# Patient Record
Sex: Male | Born: 1950 | Race: White | Hispanic: No | Marital: Married | Smoking: Former smoker
Health system: Southern US, Community
[De-identification: ages and names within clinical notes are randomized; demographics above are authoritative.]

## PROBLEM LIST (undated history)

## (undated) DIAGNOSIS — C189 Malignant neoplasm of colon, unspecified: Secondary | ICD-10-CM

## (undated) DIAGNOSIS — C787 Secondary malignant neoplasm of liver and intrahepatic bile duct: Secondary | ICD-10-CM

## (undated) DIAGNOSIS — C20 Malignant neoplasm of rectum: Secondary | ICD-10-CM

## (undated) HISTORY — DX: Malignant neoplasm of rectum: C20

## (undated) HISTORY — DX: Malignant neoplasm of colon, unspecified: C18.9

---

## 1978-09-24 HISTORY — PX: VASECTOMY REVERSAL: SHX243

## 2004-05-19 ENCOUNTER — Ambulatory Visit (HOSPITAL_COMMUNITY): Admission: RE | Admit: 2004-05-19 | Discharge: 2004-05-19 | Payer: Self-pay | Admitting: *Deleted

## 2004-05-19 ENCOUNTER — Encounter (INDEPENDENT_AMBULATORY_CARE_PROVIDER_SITE_OTHER): Payer: Self-pay | Admitting: Specialist

## 2009-08-26 ENCOUNTER — Encounter: Admission: RE | Admit: 2009-08-26 | Discharge: 2009-08-26 | Payer: Self-pay | Admitting: Gastroenterology

## 2009-09-05 ENCOUNTER — Ambulatory Visit: Payer: Self-pay | Admitting: Oncology

## 2009-09-24 HISTORY — PX: COLON SURGERY: SHX602

## 2009-09-26 ENCOUNTER — Inpatient Hospital Stay (HOSPITAL_COMMUNITY): Admission: RE | Admit: 2009-09-26 | Discharge: 2009-09-30 | Payer: Self-pay | Admitting: General Surgery

## 2009-09-26 ENCOUNTER — Encounter (INDEPENDENT_AMBULATORY_CARE_PROVIDER_SITE_OTHER): Payer: Self-pay | Admitting: General Surgery

## 2009-09-29 ENCOUNTER — Ambulatory Visit: Payer: Self-pay | Admitting: Oncology

## 2009-10-11 ENCOUNTER — Ambulatory Visit: Payer: Self-pay | Admitting: Oncology

## 2009-10-13 LAB — CBC WITH DIFFERENTIAL/PLATELET
HGB: 11.5 g/dL — ABNORMAL LOW (ref 13.0–17.1)
LYMPH%: 13.3 % — ABNORMAL LOW (ref 14.0–49.0)
MCH: 30.9 pg (ref 27.2–33.4)
MONO#: 1.4 10*3/uL — ABNORMAL HIGH (ref 0.1–0.9)
NEUT%: 68.9 % (ref 39.0–75.0)
Platelets: 310 10*3/uL (ref 140–400)
RBC: 3.72 10*6/uL — ABNORMAL LOW (ref 4.20–5.82)
RDW: 13.3 % (ref 11.0–14.6)

## 2009-10-13 LAB — COMPREHENSIVE METABOLIC PANEL
ALT: 47 U/L (ref 0–53)
AST: 23 U/L (ref 0–37)
Albumin: 3.7 g/dL (ref 3.5–5.2)
BUN: 13 mg/dL (ref 6–23)
CO2: 24 mEq/L (ref 19–32)
Chloride: 98 mEq/L (ref 96–112)
Potassium: 3.4 mEq/L — ABNORMAL LOW (ref 3.5–5.3)
Sodium: 136 mEq/L (ref 135–145)
Total Bilirubin: 0.8 mg/dL (ref 0.3–1.2)

## 2009-10-13 LAB — CEA: CEA: 1.8 ng/mL (ref 0.0–5.0)

## 2009-10-18 ENCOUNTER — Ambulatory Visit: Admission: RE | Admit: 2009-10-18 | Discharge: 2009-12-16 | Payer: Self-pay | Admitting: Radiation Oncology

## 2009-11-11 ENCOUNTER — Ambulatory Visit: Payer: Self-pay | Admitting: Oncology

## 2009-11-15 LAB — COMPREHENSIVE METABOLIC PANEL
ALT: 11 U/L (ref 0–53)
AST: 9 U/L (ref 0–37)
Albumin: 4.2 g/dL (ref 3.5–5.2)
BUN: 14 mg/dL (ref 6–23)
Potassium: 4.1 mEq/L (ref 3.5–5.3)
Sodium: 139 mEq/L (ref 135–145)
Total Bilirubin: 0.6 mg/dL (ref 0.3–1.2)

## 2009-11-15 LAB — CBC WITH DIFFERENTIAL/PLATELET
BASO%: 0.3 % (ref 0.0–2.0)
EOS%: 2 % (ref 0.0–7.0)
Eosinophils Absolute: 0.1 10*3/uL (ref 0.0–0.5)
HCT: 36.1 % — ABNORMAL LOW (ref 38.4–49.9)
LYMPH%: 19.5 % (ref 14.0–49.0)
MONO%: 10.1 % (ref 0.0–14.0)
NEUT%: 68.1 % (ref 39.0–75.0)
RBC: 4.09 10*6/uL — ABNORMAL LOW (ref 4.20–5.82)
RDW: 13.9 % (ref 11.0–14.6)
lymph#: 1.1 10*3/uL (ref 0.9–3.3)

## 2009-11-30 LAB — CBC WITH DIFFERENTIAL/PLATELET
BASO%: 0.3 % (ref 0.0–2.0)
EOS%: 1.9 % (ref 0.0–7.0)
Eosinophils Absolute: 0.1 10*3/uL (ref 0.0–0.5)
HCT: 34.5 % — ABNORMAL LOW (ref 38.4–49.9)
HGB: 12 g/dL — ABNORMAL LOW (ref 13.0–17.1)
MCHC: 34.7 g/dL (ref 32.0–36.0)
MCV: 87.8 fL (ref 79.3–98.0)
MONO%: 16.8 % — ABNORMAL HIGH (ref 0.0–14.0)
NEUT#: 3.2 10*3/uL (ref 1.5–6.5)
Platelets: 185 10*3/uL (ref 140–400)
lymph#: 0.6 10*3/uL — ABNORMAL LOW (ref 0.9–3.3)

## 2009-11-30 LAB — COMPREHENSIVE METABOLIC PANEL
BUN: 14 mg/dL (ref 6–23)
Glucose, Bld: 86 mg/dL (ref 70–99)
Sodium: 140 mEq/L (ref 135–145)

## 2009-12-12 ENCOUNTER — Ambulatory Visit: Payer: Self-pay | Admitting: Oncology

## 2009-12-14 LAB — CBC WITH DIFFERENTIAL/PLATELET
Basophils Absolute: 0 10*3/uL (ref 0.0–0.1)
EOS%: 1.6 % (ref 0.0–7.0)
MCH: 31.6 pg (ref 27.2–33.4)
MCHC: 34.9 g/dL (ref 32.0–36.0)
MONO#: 0.8 10*3/uL (ref 0.1–0.9)
MONO%: 17.4 % — ABNORMAL HIGH (ref 0.0–14.0)
NEUT#: 3.4 10*3/uL (ref 1.5–6.5)
Platelets: 232 10*3/uL (ref 140–400)
RBC: 4.14 10*6/uL — ABNORMAL LOW (ref 4.20–5.82)
RDW: 20.3 % — ABNORMAL HIGH (ref 11.0–14.6)
WBC: 4.6 10*3/uL (ref 4.0–10.3)
lymph#: 0.3 10*3/uL — ABNORMAL LOW (ref 0.9–3.3)

## 2009-12-14 LAB — COMPREHENSIVE METABOLIC PANEL
ALT: 8 U/L (ref 0–53)
Albumin: 4.1 g/dL (ref 3.5–5.2)
BUN: 10 mg/dL (ref 6–23)
CO2: 28 mEq/L (ref 19–32)
Calcium: 9.2 mg/dL (ref 8.4–10.5)
Creatinine, Ser: 0.88 mg/dL (ref 0.40–1.50)
Total Protein: 6.8 g/dL (ref 6.0–8.3)

## 2010-01-05 LAB — COMPREHENSIVE METABOLIC PANEL
AST: 16 U/L (ref 0–37)
BUN: 10 mg/dL (ref 6–23)
Calcium: 9.1 mg/dL (ref 8.4–10.5)
Chloride: 106 mEq/L (ref 96–112)
Glucose, Bld: 112 mg/dL — ABNORMAL HIGH (ref 70–99)
Total Protein: 6.6 g/dL (ref 6.0–8.3)

## 2010-01-05 LAB — CBC WITH DIFFERENTIAL/PLATELET
EOS%: 3.5 % (ref 0.0–7.0)
HGB: 12.6 g/dL — ABNORMAL LOW (ref 13.0–17.1)
LYMPH%: 16.1 % (ref 14.0–49.0)
MCH: 31.8 pg (ref 27.2–33.4)
MCHC: 35.5 g/dL (ref 32.0–36.0)
MONO#: 0.5 10*3/uL (ref 0.1–0.9)
Platelets: 226 10*3/uL (ref 140–400)
RDW: 19 % — ABNORMAL HIGH (ref 11.0–14.6)
WBC: 3.8 10*3/uL — ABNORMAL LOW (ref 4.0–10.3)

## 2010-01-18 ENCOUNTER — Ambulatory Visit: Payer: Self-pay | Admitting: Oncology

## 2010-01-19 LAB — COMPREHENSIVE METABOLIC PANEL
ALT: 13 U/L (ref 0–53)
Albumin: 4.1 g/dL (ref 3.5–5.2)
BUN: 16 mg/dL (ref 6–23)

## 2010-01-19 LAB — CBC WITH DIFFERENTIAL/PLATELET
Eosinophils Absolute: 0.1 10*3/uL (ref 0.0–0.5)
HCT: 36.1 % — ABNORMAL LOW (ref 38.4–49.9)
HGB: 12.7 g/dL — ABNORMAL LOW (ref 13.0–17.1)
MCH: 31.7 pg (ref 27.2–33.4)
MCV: 90.3 fL (ref 79.3–98.0)
MONO#: 0.6 10*3/uL (ref 0.1–0.9)
NEUT#: 2.8 10*3/uL (ref 1.5–6.5)
Platelets: 174 10*3/uL (ref 140–400)
RBC: 4 10*6/uL — ABNORMAL LOW (ref 4.20–5.82)
RDW: 18.7 % — ABNORMAL HIGH (ref 11.0–14.6)
lymph#: 0.6 10*3/uL — ABNORMAL LOW (ref 0.9–3.3)

## 2010-01-19 LAB — CEA: CEA: 4.8 ng/mL (ref 0.0–5.0)

## 2010-01-30 ENCOUNTER — Ambulatory Visit (HOSPITAL_COMMUNITY): Admission: RE | Admit: 2010-01-30 | Discharge: 2010-01-30 | Payer: Self-pay | Admitting: Oncology

## 2010-02-02 LAB — CBC WITH DIFFERENTIAL/PLATELET
BASO%: 0.3 % (ref 0.0–2.0)
Basophils Absolute: 0 10*3/uL (ref 0.0–0.1)
EOS%: 3 % (ref 0.0–7.0)
Eosinophils Absolute: 0.1 10*3/uL (ref 0.0–0.5)
HCT: 36.5 % — ABNORMAL LOW (ref 38.4–49.9)
HGB: 12.8 g/dL — ABNORMAL LOW (ref 13.0–17.1)
LYMPH%: 15.6 % (ref 14.0–49.0)
MCH: 31.3 pg (ref 27.2–33.4)
MCHC: 35 g/dL (ref 32.0–36.0)
MCV: 89.4 fL (ref 79.3–98.0)
MONO#: 0.7 10*3/uL (ref 0.1–0.9)
MONO%: 18.8 % — ABNORMAL HIGH (ref 0.0–14.0)
NEUT#: 2.3 10*3/uL (ref 1.5–6.5)
NEUT%: 62.3 % (ref 39.0–75.0)
Platelets: 155 10*3/uL (ref 140–400)
RBC: 4.08 10*6/uL — ABNORMAL LOW (ref 4.20–5.82)
RDW: 17.4 % — ABNORMAL HIGH (ref 11.0–14.6)
WBC: 3.7 10*3/uL — ABNORMAL LOW (ref 4.0–10.3)
lymph#: 0.6 10*3/uL — ABNORMAL LOW (ref 0.9–3.3)

## 2010-02-02 LAB — COMPREHENSIVE METABOLIC PANEL
ALT: 16 U/L (ref 0–53)
AST: 15 U/L (ref 0–37)
Albumin: 4.2 g/dL (ref 3.5–5.2)
Alkaline Phosphatase: 52 U/L (ref 39–117)
Calcium: 9.1 mg/dL (ref 8.4–10.5)
Glucose, Bld: 100 mg/dL — ABNORMAL HIGH (ref 70–99)
Potassium: 4.3 mEq/L (ref 3.5–5.3)
Total Bilirubin: 1 mg/dL (ref 0.3–1.2)

## 2010-02-02 LAB — CEA: CEA: 2.8 ng/mL (ref 0.0–5.0)

## 2010-02-14 ENCOUNTER — Ambulatory Visit (HOSPITAL_COMMUNITY): Admission: RE | Admit: 2010-02-14 | Discharge: 2010-02-14 | Payer: Self-pay | Admitting: Oncology

## 2010-02-16 LAB — COMPREHENSIVE METABOLIC PANEL
ALT: 13 U/L (ref 0–53)
AST: 15 U/L (ref 0–37)
Alkaline Phosphatase: 48 U/L (ref 39–117)
BUN: 12 mg/dL (ref 6–23)
Glucose, Bld: 100 mg/dL — ABNORMAL HIGH (ref 70–99)
Total Protein: 6.2 g/dL (ref 6.0–8.3)

## 2010-02-16 LAB — CBC WITH DIFFERENTIAL/PLATELET
BASO%: 0.5 % (ref 0.0–2.0)
LYMPH%: 19.4 % (ref 14.0–49.0)
MCV: 87.1 fL (ref 79.3–98.0)
MONO#: 0.7 10*3/uL (ref 0.1–0.9)
RDW: 15.6 % — ABNORMAL HIGH (ref 11.0–14.6)
lymph#: 0.8 10*3/uL — ABNORMAL LOW (ref 0.9–3.3)
nRBC: 0 % (ref 0–0)

## 2010-02-16 LAB — CEA: CEA: 1.9 ng/mL (ref 0.0–5.0)

## 2010-02-17 ENCOUNTER — Ambulatory Visit: Payer: Self-pay | Admitting: Oncology

## 2010-03-02 LAB — CBC WITH DIFFERENTIAL/PLATELET
EOS%: 5.3 % (ref 0.0–7.0)
Eosinophils Absolute: 0.2 10*3/uL (ref 0.0–0.5)
LYMPH%: 13.1 % — ABNORMAL LOW (ref 14.0–49.0)
MCH: 30.8 pg (ref 27.2–33.4)
MCHC: 35.5 g/dL (ref 32.0–36.0)
MCV: 86.8 fL (ref 79.3–98.0)
MONO%: 24.9 % — ABNORMAL HIGH (ref 0.0–14.0)
NEUT%: 55.7 % (ref 39.0–75.0)

## 2010-03-02 LAB — COMPREHENSIVE METABOLIC PANEL
ALT: 15 U/L (ref 0–53)
Albumin: 4.3 g/dL (ref 3.5–5.2)
CO2: 22 mEq/L (ref 19–32)
Chloride: 107 mEq/L (ref 96–112)
Creatinine, Ser: 0.86 mg/dL (ref 0.40–1.50)
Potassium: 4.2 mEq/L (ref 3.5–5.3)
Total Protein: 6.4 g/dL (ref 6.0–8.3)

## 2010-03-02 LAB — CEA: CEA: 1.9 ng/mL (ref 0.0–5.0)

## 2010-03-16 LAB — COMPREHENSIVE METABOLIC PANEL
BUN: 13 mg/dL (ref 6–23)
CO2: 22 mEq/L (ref 19–32)
Chloride: 106 mEq/L (ref 96–112)
Creatinine, Ser: 1.01 mg/dL (ref 0.40–1.50)
Glucose, Bld: 110 mg/dL — ABNORMAL HIGH (ref 70–99)
Sodium: 140 mEq/L (ref 135–145)
Total Protein: 6.2 g/dL (ref 6.0–8.3)

## 2010-03-16 LAB — CBC WITH DIFFERENTIAL/PLATELET
BASO%: 0.5 % (ref 0.0–2.0)
Eosinophils Absolute: 0.2 10*3/uL (ref 0.0–0.5)
HCT: 37.1 % — ABNORMAL LOW (ref 38.4–49.9)
HGB: 13.1 g/dL (ref 13.0–17.1)
MONO#: 0.8 10*3/uL (ref 0.1–0.9)
MONO%: 19.8 % — ABNORMAL HIGH (ref 0.0–14.0)
WBC: 3.8 10*3/uL — ABNORMAL LOW (ref 4.0–10.3)

## 2010-03-28 ENCOUNTER — Ambulatory Visit: Payer: Self-pay | Admitting: Oncology

## 2010-03-30 LAB — COMPREHENSIVE METABOLIC PANEL
ALT: 20 U/L (ref 0–53)
Albumin: 4.5 g/dL (ref 3.5–5.2)
BUN: 17 mg/dL (ref 6–23)
CO2: 23 mEq/L (ref 19–32)
Chloride: 106 mEq/L (ref 96–112)
Potassium: 4.2 mEq/L (ref 3.5–5.3)
Total Bilirubin: 1 mg/dL (ref 0.3–1.2)

## 2010-03-30 LAB — CBC WITH DIFFERENTIAL/PLATELET
Basophils Absolute: 0 10*3/uL (ref 0.0–0.1)
Eosinophils Absolute: 0.2 10*3/uL (ref 0.0–0.5)
HCT: 40 % (ref 38.4–49.9)
LYMPH%: 14.8 % (ref 14.0–49.0)
MONO%: 22.3 % — ABNORMAL HIGH (ref 0.0–14.0)
NEUT%: 58.7 % (ref 39.0–75.0)
RDW: 15.4 % — ABNORMAL HIGH (ref 11.0–14.6)
WBC: 4.3 10*3/uL (ref 4.0–10.3)

## 2010-04-13 LAB — COMPREHENSIVE METABOLIC PANEL
ALT: 28 U/L (ref 0–53)
Albumin: 4.3 g/dL (ref 3.5–5.2)
Alkaline Phosphatase: 50 U/L (ref 39–117)
CO2: 21 mEq/L (ref 19–32)
Chloride: 107 mEq/L (ref 96–112)
Creatinine, Ser: 0.86 mg/dL (ref 0.40–1.50)
Glucose, Bld: 91 mg/dL (ref 70–99)
Potassium: 4.5 mEq/L (ref 3.5–5.3)
Sodium: 139 mEq/L (ref 135–145)
Total Protein: 6.4 g/dL (ref 6.0–8.3)

## 2010-04-13 LAB — CBC WITH DIFFERENTIAL/PLATELET
BASO%: 0.7 % (ref 0.0–2.0)
Basophils Absolute: 0 10*3/uL (ref 0.0–0.1)
HCT: 37.6 % — ABNORMAL LOW (ref 38.4–49.9)
LYMPH%: 14.4 % (ref 14.0–49.0)
MCHC: 34.6 g/dL (ref 32.0–36.0)
MCV: 89.3 fL (ref 79.3–98.0)
MONO#: 1 10*3/uL — ABNORMAL HIGH (ref 0.1–0.9)
NEUT#: 2.7 10*3/uL (ref 1.5–6.5)
WBC: 4.6 10*3/uL (ref 4.0–10.3)
nRBC: 0 % (ref 0–0)

## 2010-05-18 ENCOUNTER — Ambulatory Visit: Payer: Self-pay | Admitting: Oncology

## 2010-05-22 ENCOUNTER — Ambulatory Visit (HOSPITAL_COMMUNITY): Admission: RE | Admit: 2010-05-22 | Discharge: 2010-05-22 | Payer: Self-pay | Admitting: Oncology

## 2010-05-22 LAB — CBC WITH DIFFERENTIAL/PLATELET
BASO%: 0.3 % (ref 0.0–2.0)
EOS%: 2.7 % (ref 0.0–7.0)
Eosinophils Absolute: 0.1 10*3/uL (ref 0.0–0.5)
LYMPH%: 9.4 % — ABNORMAL LOW (ref 14.0–49.0)
MCHC: 34.3 g/dL (ref 32.0–36.0)
MONO%: 12.5 % (ref 0.0–14.0)
NEUT#: 4 10*3/uL (ref 1.5–6.5)
NEUT%: 75.1 % — ABNORMAL HIGH (ref 39.0–75.0)
RDW: 13.8 % (ref 11.0–14.6)

## 2010-05-22 LAB — COMPREHENSIVE METABOLIC PANEL
AST: 17 U/L (ref 0–37)
Albumin: 4.5 g/dL (ref 3.5–5.2)
Alkaline Phosphatase: 60 U/L (ref 39–117)
CO2: 26 mEq/L (ref 19–32)
Chloride: 104 mEq/L (ref 96–112)
Glucose, Bld: 111 mg/dL — ABNORMAL HIGH (ref 70–99)

## 2010-07-05 ENCOUNTER — Ambulatory Visit: Payer: Self-pay | Admitting: Oncology

## 2010-08-22 ENCOUNTER — Ambulatory Visit: Payer: Self-pay | Admitting: Oncology

## 2010-08-24 LAB — CBC WITH DIFFERENTIAL/PLATELET
EOS%: 6.4 % (ref 0.0–7.0)
MCH: 30.8 pg (ref 27.2–33.4)
MCHC: 35 g/dL (ref 32.0–36.0)
MCV: 88.1 fL (ref 79.3–98.0)
RBC: 4.4 10*6/uL (ref 4.20–5.82)
RDW: 12.5 % (ref 11.0–14.6)
WBC: 3.6 10*3/uL — ABNORMAL LOW (ref 4.0–10.3)
lymph#: 0.5 10*3/uL — ABNORMAL LOW (ref 0.9–3.3)

## 2010-08-24 LAB — COMPREHENSIVE METABOLIC PANEL
Alkaline Phosphatase: 59 U/L (ref 39–117)
BUN: 17 mg/dL (ref 6–23)
CO2: 25 mEq/L (ref 19–32)
Chloride: 105 mEq/L (ref 96–112)
Creatinine, Ser: 0.88 mg/dL (ref 0.40–1.50)
Glucose, Bld: 94 mg/dL (ref 70–99)
Potassium: 4.3 mEq/L (ref 3.5–5.3)
Sodium: 138 mEq/L (ref 135–145)

## 2010-08-24 LAB — CEA: CEA: 5.6 ng/mL — ABNORMAL HIGH (ref 0.0–5.0)

## 2010-10-09 ENCOUNTER — Ambulatory Visit: Payer: Self-pay | Admitting: Oncology

## 2010-10-11 LAB — COMPREHENSIVE METABOLIC PANEL
ALT: 12 U/L (ref 0–53)
AST: 17 U/L (ref 0–37)
Albumin: 4.8 g/dL (ref 3.5–5.2)
Alkaline Phosphatase: 55 U/L (ref 39–117)
BUN: 13 mg/dL (ref 6–23)
CO2: 25 mEq/L (ref 19–32)
Calcium: 9.5 mg/dL (ref 8.4–10.5)
Chloride: 105 mEq/L (ref 96–112)
Creatinine, Ser: 0.93 mg/dL (ref 0.40–1.50)
Glucose, Bld: 91 mg/dL (ref 70–99)
Potassium: 4.3 mEq/L (ref 3.5–5.3)
Sodium: 139 mEq/L (ref 135–145)
Total Bilirubin: 1.1 mg/dL (ref 0.3–1.2)
Total Protein: 6.9 g/dL (ref 6.0–8.3)

## 2010-10-11 LAB — CBC WITH DIFFERENTIAL/PLATELET
BASO%: 0.3 % (ref 0.0–2.0)
Basophils Absolute: 0 10*3/uL (ref 0.0–0.1)
EOS%: 4.6 % (ref 0.0–7.0)
Eosinophils Absolute: 0.2 10*3/uL (ref 0.0–0.5)
HCT: 37.6 % — ABNORMAL LOW (ref 38.4–49.9)
HGB: 13.2 g/dL (ref 13.0–17.1)
LYMPH%: 16.1 % (ref 14.0–49.0)
MCH: 30.5 pg (ref 27.2–33.4)
MCHC: 35.2 g/dL (ref 32.0–36.0)
MCV: 86.5 fL (ref 79.3–98.0)
MONO#: 0.5 10*3/uL (ref 0.1–0.9)
MONO%: 15.5 % — ABNORMAL HIGH (ref 0.0–14.0)
NEUT#: 2.2 10*3/uL (ref 1.5–6.5)
NEUT%: 63.5 % (ref 39.0–75.0)
Platelets: 189 10*3/uL (ref 140–400)
RBC: 4.34 10*6/uL (ref 4.20–5.82)
RDW: 13.3 % (ref 11.0–14.6)
WBC: 3.4 10*3/uL — ABNORMAL LOW (ref 4.0–10.3)
lymph#: 0.5 10*3/uL — ABNORMAL LOW (ref 0.9–3.3)

## 2010-10-11 LAB — CEA: CEA: 17.2 ng/mL — ABNORMAL HIGH (ref 0.0–5.0)

## 2010-10-14 ENCOUNTER — Other Ambulatory Visit: Payer: Self-pay | Admitting: Oncology

## 2010-10-14 DIAGNOSIS — Z800 Family history of malignant neoplasm of digestive organs: Secondary | ICD-10-CM

## 2010-10-15 ENCOUNTER — Encounter: Payer: Self-pay | Admitting: Oncology

## 2010-11-21 ENCOUNTER — Encounter (HOSPITAL_COMMUNITY): Payer: Self-pay

## 2010-11-21 ENCOUNTER — Ambulatory Visit (HOSPITAL_COMMUNITY)
Admission: RE | Admit: 2010-11-21 | Discharge: 2010-11-21 | Disposition: A | Discharge status: Home / Self Care | Payer: BC Managed Care – PPO | Source: Ambulatory Visit | Attending: Oncology | Admitting: Oncology

## 2010-11-21 ENCOUNTER — Encounter (HOSPITAL_BASED_OUTPATIENT_CLINIC_OR_DEPARTMENT_OTHER): Payer: BC Managed Care – PPO | Admitting: Oncology

## 2010-11-21 ENCOUNTER — Other Ambulatory Visit: Payer: Self-pay | Admitting: Oncology

## 2010-11-21 DIAGNOSIS — E278 Other specified disorders of adrenal gland: Secondary | ICD-10-CM | POA: Insufficient documentation

## 2010-11-21 DIAGNOSIS — C19 Malignant neoplasm of rectosigmoid junction: Secondary | ICD-10-CM

## 2010-11-21 DIAGNOSIS — J984 Other disorders of lung: Secondary | ICD-10-CM | POA: Insufficient documentation

## 2010-11-21 DIAGNOSIS — Z800 Family history of malignant neoplasm of digestive organs: Secondary | ICD-10-CM

## 2010-11-21 DIAGNOSIS — Z452 Encounter for adjustment and management of vascular access device: Secondary | ICD-10-CM

## 2010-11-21 DIAGNOSIS — C189 Malignant neoplasm of colon, unspecified: Secondary | ICD-10-CM | POA: Insufficient documentation

## 2010-11-21 LAB — CMP (CANCER CENTER ONLY)
ALT(SGPT): 19 U/L (ref 10–47)
AST: 20 U/L (ref 11–38)
Albumin: 4 g/dL (ref 3.3–5.5)
BUN, Bld: 14 mg/dL (ref 7–22)
Calcium: 9.3 mg/dL (ref 8.0–10.3)
Chloride: 98 mEq/L (ref 98–108)
Potassium: 4.2 mEq/L (ref 3.3–4.7)

## 2010-11-21 LAB — CBC WITH DIFFERENTIAL/PLATELET
BASO%: 0.5 % (ref 0.0–2.0)
Basophils Absolute: 0 10*3/uL (ref 0.0–0.1)
EOS%: 6.4 % (ref 0.0–7.0)
HGB: 13.7 g/dL (ref 13.0–17.1)
MCH: 30.1 pg (ref 27.2–33.4)
RDW: 13.2 % (ref 11.0–14.6)
WBC: 3.8 10*3/uL — ABNORMAL LOW (ref 4.0–10.3)
lymph#: 0.7 10*3/uL — ABNORMAL LOW (ref 0.9–3.3)

## 2010-11-21 MED ORDER — IOHEXOL 300 MG/ML  SOLN
125.0000 mL | Freq: Once | INTRAMUSCULAR | Status: AC | PRN
  Administered 2010-11-21: 125 mL via INTRAVENOUS

## 2010-11-23 ENCOUNTER — Encounter (HOSPITAL_BASED_OUTPATIENT_CLINIC_OR_DEPARTMENT_OTHER): Payer: BC Managed Care – PPO | Admitting: Oncology

## 2010-11-23 ENCOUNTER — Other Ambulatory Visit: Payer: Self-pay | Admitting: Oncology

## 2010-11-23 DIAGNOSIS — N2889 Other specified disorders of kidney and ureter: Secondary | ICD-10-CM

## 2010-11-23 DIAGNOSIS — C189 Malignant neoplasm of colon, unspecified: Secondary | ICD-10-CM

## 2010-11-23 DIAGNOSIS — C19 Malignant neoplasm of rectosigmoid junction: Secondary | ICD-10-CM

## 2010-11-30 ENCOUNTER — Other Ambulatory Visit: Payer: Self-pay | Admitting: Oncology

## 2010-11-30 ENCOUNTER — Encounter (HOSPITAL_COMMUNITY)
Admission: RE | Admit: 2010-11-30 | Discharge: 2010-11-30 | Disposition: A | Discharge status: Home / Self Care | Payer: BC Managed Care – PPO | Source: Ambulatory Visit | Attending: Oncology | Admitting: Oncology

## 2010-11-30 DIAGNOSIS — N2889 Other specified disorders of kidney and ureter: Secondary | ICD-10-CM

## 2010-11-30 DIAGNOSIS — C189 Malignant neoplasm of colon, unspecified: Secondary | ICD-10-CM | POA: Insufficient documentation

## 2010-11-30 DIAGNOSIS — E279 Disorder of adrenal gland, unspecified: Secondary | ICD-10-CM | POA: Insufficient documentation

## 2010-11-30 DIAGNOSIS — R911 Solitary pulmonary nodule: Secondary | ICD-10-CM | POA: Insufficient documentation

## 2010-11-30 LAB — GLUCOSE, CAPILLARY: Glucose-Capillary: 104 mg/dL — ABNORMAL HIGH (ref 70–99)

## 2010-11-30 MED ORDER — FLUDEOXYGLUCOSE F - 18 (FDG) INJECTION
18.2000 | Freq: Once | INTRAVENOUS | Status: AC | PRN
  Administered 2010-11-30: 18.2 via INTRAVENOUS

## 2010-12-10 LAB — CBC
Hemoglobin: 12.1 g/dL — ABNORMAL LOW (ref 13.0–17.0)
Hemoglobin: 12.3 g/dL — ABNORMAL LOW (ref 13.0–17.0)
MCHC: 34.9 g/dL (ref 30.0–36.0)
MCV: 90.2 fL (ref 78.0–100.0)
RBC: 3.84 MIL/uL — ABNORMAL LOW (ref 4.22–5.81)
RBC: 3.91 MIL/uL — ABNORMAL LOW (ref 4.22–5.81)
RDW: 13 % (ref 11.5–15.5)

## 2010-12-10 LAB — DIFFERENTIAL
Basophils Absolute: 0 10*3/uL (ref 0.0–0.1)
Basophils Relative: 0 % (ref 0–1)
Eosinophils Absolute: 0.1 10*3/uL (ref 0.0–0.7)
Eosinophils Absolute: 0.1 10*3/uL (ref 0.0–0.7)
Eosinophils Relative: 1 % (ref 0–5)
Eosinophils Relative: 2 % (ref 0–5)
Lymphocytes Relative: 16 % (ref 12–46)
Lymphs Abs: 1.3 10*3/uL (ref 0.7–4.0)
Monocytes Absolute: 0.9 10*3/uL (ref 0.1–1.0)
Monocytes Relative: 11 % (ref 3–12)
Monocytes Relative: 15 % — ABNORMAL HIGH (ref 3–12)
Neutro Abs: 4.3 10*3/uL (ref 1.7–7.7)
Neutrophils Relative %: 72 % (ref 43–77)

## 2010-12-10 LAB — BASIC METABOLIC PANEL
CO2: 29 mEq/L (ref 19–32)
Calcium: 8.1 mg/dL — ABNORMAL LOW (ref 8.4–10.5)
Calcium: 8.3 mg/dL — ABNORMAL LOW (ref 8.4–10.5)
Chloride: 102 mEq/L (ref 96–112)
Creatinine, Ser: 0.7 mg/dL (ref 0.4–1.5)
GFR calc Af Amer: >60 mL/min (ref >60)
GFR calc Af Amer: >60 mL/min (ref >60)
GFR calc non Af Amer: >60 mL/min (ref >60)
Glucose, Bld: 130 mg/dL — ABNORMAL HIGH (ref 70–99)
Sodium: 135 mEq/L (ref 135–145)
Sodium: 137 mEq/L (ref 135–145)

## 2010-12-25 LAB — CEA: CEA: 34.8 ng/mL — ABNORMAL HIGH (ref 0.0–5.0)

## 2010-12-25 LAB — DIFFERENTIAL
Eosinophils Absolute: 0.4 10*3/uL (ref 0.0–0.7)
Eosinophils Relative: 5 % (ref 0–5)
Lymphocytes Relative: 31 % (ref 12–46)
Lymphs Abs: 2.3 10*3/uL (ref 0.7–4.0)
Monocytes Relative: 12 % (ref 3–12)

## 2010-12-25 LAB — COMPREHENSIVE METABOLIC PANEL
ALT: 15 U/L (ref 0–53)
AST: 17 U/L (ref 0–37)
Albumin: 4.2 g/dL (ref 3.5–5.2)
CO2: 28 mEq/L (ref 19–32)
Calcium: 9.3 mg/dL (ref 8.4–10.5)
Creatinine, Ser: 0.76 mg/dL (ref 0.4–1.5)
GFR calc Af Amer: >60 mL/min (ref >60)
GFR calc non Af Amer: >60 mL/min (ref >60)
Sodium: 139 mEq/L (ref 135–145)
Total Protein: 7.2 g/dL (ref 6.0–8.3)

## 2010-12-25 LAB — CBC
MCHC: 34.8 g/dL (ref 30.0–36.0)
Platelets: 217 10*3/uL (ref 150–400)
RDW: 12.9 % (ref 11.5–15.5)

## 2011-01-02 ENCOUNTER — Other Ambulatory Visit: Payer: Self-pay | Admitting: General Surgery

## 2011-01-02 ENCOUNTER — Encounter (HOSPITAL_COMMUNITY): Payer: BC Managed Care – PPO | Attending: General Surgery

## 2011-01-02 DIAGNOSIS — Z01810 Encounter for preprocedural cardiovascular examination: Secondary | ICD-10-CM | POA: Insufficient documentation

## 2011-01-02 DIAGNOSIS — Z01812 Encounter for preprocedural laboratory examination: Secondary | ICD-10-CM | POA: Insufficient documentation

## 2011-01-02 DIAGNOSIS — C20 Malignant neoplasm of rectum: Secondary | ICD-10-CM | POA: Insufficient documentation

## 2011-01-02 DIAGNOSIS — E279 Disorder of adrenal gland, unspecified: Secondary | ICD-10-CM | POA: Insufficient documentation

## 2011-01-02 LAB — COMPREHENSIVE METABOLIC PANEL
Alkaline Phosphatase: 58 U/L (ref 39–117)
BUN: 9 mg/dL (ref 6–23)
CO2: 28 mEq/L (ref 19–32)
Chloride: 104 mEq/L (ref 96–112)
GFR calc non Af Amer: >60 mL/min (ref >60)
Glucose, Bld: 89 mg/dL (ref 70–99)
Potassium: 3.9 mEq/L (ref 3.5–5.1)
Total Bilirubin: 0.8 mg/dL (ref 0.3–1.2)

## 2011-01-02 LAB — DIFFERENTIAL
Eosinophils Absolute: 0.2 10*3/uL (ref 0.0–0.7)
Lymphs Abs: 0.7 10*3/uL (ref 0.7–4.0)
Monocytes Absolute: 0.7 10*3/uL (ref 0.1–1.0)
Monocytes Relative: 13 % — ABNORMAL HIGH (ref 3–12)
Neutro Abs: 3.6 10*3/uL (ref 1.7–7.7)
Neutrophils Relative %: 68 % (ref 43–77)

## 2011-01-02 LAB — URINALYSIS, ROUTINE W REFLEX MICROSCOPIC
Bilirubin Urine: NEGATIVE
Glucose, UA: NEGATIVE mg/dL
Ketones, ur: NEGATIVE mg/dL
Protein, ur: NEGATIVE mg/dL
pH: 7.5 (ref 5.0–8.0)

## 2011-01-02 LAB — CBC
Hemoglobin: 13.8 g/dL (ref 13.0–17.0)
MCH: 29.4 pg (ref 26.0–34.0)
MCHC: 33.3 g/dL (ref 30.0–36.0)
MCV: 88.1 fL (ref 78.0–100.0)
RBC: 4.7 MIL/uL (ref 4.22–5.81)

## 2011-01-02 LAB — SURGICAL PCR SCREEN
MRSA, PCR: NEGATIVE
Staphylococcus aureus: POSITIVE — AB

## 2011-01-10 ENCOUNTER — Other Ambulatory Visit: Payer: Self-pay | Admitting: General Surgery

## 2011-01-10 ENCOUNTER — Ambulatory Visit (HOSPITAL_COMMUNITY)
Admission: RE | Admit: 2011-01-10 | Discharge: 2011-01-11 | Disposition: A | Discharge status: Home / Self Care | Payer: BC Managed Care – PPO | Source: Ambulatory Visit | Attending: General Surgery | Admitting: General Surgery

## 2011-01-10 DIAGNOSIS — Z01812 Encounter for preprocedural laboratory examination: Secondary | ICD-10-CM | POA: Insufficient documentation

## 2011-01-10 DIAGNOSIS — C20 Malignant neoplasm of rectum: Secondary | ICD-10-CM | POA: Insufficient documentation

## 2011-01-10 DIAGNOSIS — E279 Disorder of adrenal gland, unspecified: Secondary | ICD-10-CM | POA: Insufficient documentation

## 2011-01-10 DIAGNOSIS — C7970 Secondary malignant neoplasm of unspecified adrenal gland: Principal | ICD-10-CM | POA: Insufficient documentation

## 2011-01-10 HISTORY — PX: ADRENALECTOMY: SHX876

## 2011-01-10 LAB — ABO/RH: ABO/RH(D): A POS

## 2011-01-11 LAB — CBC
HCT: 35.7 % — ABNORMAL LOW (ref 39.0–52.0)
Hemoglobin: 11.9 g/dL — ABNORMAL LOW (ref 13.0–17.0)
MCH: 28.7 pg (ref 26.0–34.0)
MCHC: 33.3 g/dL (ref 30.0–36.0)
MCV: 86.2 fL (ref 78.0–100.0)

## 2011-01-15 NOTE — Op Note (Signed)
Christopher Jimenez, Christopher Jimenez                ACCOUNT NO.:  [id number]  MEDICAL RECORD NO.:  [id number]           PATIENT TYPE:  O  LOCATION:  1534                         FACILITY:  [facility]  PHYSICIAN:  Sharlet Salina T. 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  SURGICAL PROCEDURE:  Laparoscopic right adrenalectomy.  SURGEON:  Lorne Skeens. Joneric Streight, M.D.  ASSISTANT:  Almond Lint, MD  ANESTHESIA:  General.  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 Carolynne Edouard in December 2010.  He had postoperative chemoradiation.  He recently was found to have a rising CEA up to 45.2, and CT scan of the chest, abdomen and pelvis has revealed a 3.2-cm mass in the medial aspect of the right adrenal gland.  This area lights up on PET scan as well and there is no other evidence of disease.  With this finding, we have recommended proceeding with right adrenalectomy laparoscopically.  The nature of the procedure, its indications, risks of anesthetic complications, bleeding, infection, possible need for open procedure were discussed and understood.  He is now brought to the operating room for this procedure.  DESCRIPTION OF OPERATION:  The patient brought to the operating room, placed supine position on operating table and general endotracheal anesthesia was induced.  He was then carefully positioned in the left lateral decubitus position and the table broken to open up the right side of the abdomen, and he was positioned on a beanbag, carefully padded and taped.  Foley catheter and orogastric tube had been placed. He received  preoperative antibiotics.  The abdomen and right flank were widely and sterilely prepped and draped.  Correct patient and procedure were verified.  As the patient has had previous laparotomy, I used open Hasson technique through a 1.5-cm incision just beneath the right costal margin, little anterior to the anterior axillary line, and the peritoneum was entered under direct vision.  With 3-0 mattress sutures of 0 Vicryl, the Hasson trocar was placed and pneumoperitoneum established.  Under direct vision, a 5-mm trocar was placed more medially subcostally, a 12-mm trocar somewhat more laterally and then finally a 5-mm trocar in the right flank, all under direct vision. Survey of the abdomen showed no evidence of peritoneal implants or other metastatic disease.  The liver appeared normal.  There were some adhesions over the dome of the right lobe of the liver and from the inferior edge of the right lobe of liver to the omentum, which were carefully taken down with cautery.  The triangular ligament and vena cava were exposed.  Using careful hook cautery dissection, the triangular ligament was incised and the right lobe of the liver mobilized medially and inferiorly, dividing the triangular ligament up to the diaphragm.  The duodenum and vena cava were clearly identified. The adrenal gland could  be seen in the retroperitoneum at this point and there did seem to be a small, firm, mobile mass just lateral to the vena cava.  A flexible retractor was used to retract the right lobe of the liver medially and using the hook cautery, the peritoneum along the lateral edge of the vena cava was incised and careful blunt dissection was used to mobilize the adrenal gland away from the vena cava along its medial length.  The mass was seen medially and superiorly in the adrenal gland but was quite mobile and not at all fixed or attached to the vena cava or surrounding tissue.  As the adrenal was mobilized  away from the cava, the adrenal vein was identified entering the gland just inferior to the mass.  Using careful blunt dissection, the vein was completely dissected free and mobilized from its origin at the adrenal gland over to the vena cava.  After complete mobilization of the vein, there was plenty of length for clipping, and the vein was controlled with 1 clip on the adrenal side and 2 clips on the cava side and then divided.  This allowed the gland to be mobilized away from the cava laterally and areola attachments were divided with cautery and harmonic scalpel.  I then came around the superior and inferior borders of the adrenal gland which were well defined with the harmonic scalpel, and the gland was dissected out of the retroperitoneum.  Small arterial branches were controlled with cautery.  I was then able to sweep the gland inferiorly and divide lateral attachments with the cautery and finally we came across the inferior edge through perinephric fat just below the inferior edge of the adrenal gland clearly visualizing the kidney and the gland was removed.  The bed of the adrenal was irrigated.  Hemostasis was assured and a Surgicel pack placed here and the liver placed back over the defect.  The gland was placed in an EndoCatch bag and brought out through the Kirvin trocar site.  All CO2 was evacuated and trocars removed.  The fascia at the site was closed with a further figure-of- eight suture of 0 Vicryl.  Skin incisions were closed with subcuticular Monocryl and Dermabond.  Sponge and needle counts correct.  The patient was taken to recovery in good condition.     Lorne Skeens. Casimiro Lienhard, M.D.     Tory Emerald  D:  01/10/2011  T:  01/10/2011  Job:  [id number]  cc:   Blenda Nicely. Clelia Croft, M.D. Fax: [phone]  Electronically Signed by Glenna Fellows M.D. on 01/15/2011 10:03:12 AM

## 2011-02-07 ENCOUNTER — Other Ambulatory Visit: Payer: Self-pay | Admitting: Oncology

## 2011-02-07 ENCOUNTER — Encounter (HOSPITAL_BASED_OUTPATIENT_CLINIC_OR_DEPARTMENT_OTHER): Payer: BC Managed Care – PPO | Admitting: Oncology

## 2011-02-07 DIAGNOSIS — C19 Malignant neoplasm of rectosigmoid junction: Secondary | ICD-10-CM

## 2011-02-07 DIAGNOSIS — C189 Malignant neoplasm of colon, unspecified: Secondary | ICD-10-CM

## 2011-02-07 LAB — CBC WITH DIFFERENTIAL/PLATELET
Eosinophils Absolute: 0.3 10*3/uL (ref 0.0–0.5)
LYMPH%: 20 % (ref 14.0–49.0)
MCV: 87.8 fL (ref 79.3–98.0)
MONO%: 12.6 % (ref 0.0–14.0)
NEUT#: 2.3 10*3/uL (ref 1.5–6.5)
Platelets: 187 10*3/uL (ref 140–400)
RBC: 4.19 10*6/uL — ABNORMAL LOW (ref 4.20–5.82)

## 2011-02-07 LAB — COMPREHENSIVE METABOLIC PANEL
Alkaline Phosphatase: 59 U/L (ref 39–117)
BUN: 17 mg/dL (ref 6–23)
Glucose, Bld: 103 mg/dL — ABNORMAL HIGH (ref 70–99)
Sodium: 137 mEq/L (ref 135–145)
Total Bilirubin: 0.4 mg/dL (ref 0.3–1.2)

## 2011-03-21 ENCOUNTER — Encounter (HOSPITAL_BASED_OUTPATIENT_CLINIC_OR_DEPARTMENT_OTHER): Payer: BC Managed Care – PPO | Admitting: Oncology

## 2011-03-21 DIAGNOSIS — C19 Malignant neoplasm of rectosigmoid junction: Secondary | ICD-10-CM

## 2011-05-07 ENCOUNTER — Other Ambulatory Visit: Payer: Self-pay | Admitting: Oncology

## 2011-05-07 ENCOUNTER — Encounter (HOSPITAL_BASED_OUTPATIENT_CLINIC_OR_DEPARTMENT_OTHER): Payer: BC Managed Care – PPO | Admitting: Oncology

## 2011-05-07 ENCOUNTER — Ambulatory Visit (HOSPITAL_COMMUNITY)
Admission: RE | Admit: 2011-05-07 | Discharge: 2011-05-07 | Disposition: A | Discharge status: Home / Self Care | Payer: BC Managed Care – PPO | Source: Ambulatory Visit | Attending: Oncology | Admitting: Oncology

## 2011-05-07 DIAGNOSIS — J438 Other emphysema: Secondary | ICD-10-CM | POA: Insufficient documentation

## 2011-05-07 DIAGNOSIS — C189 Malignant neoplasm of colon, unspecified: Secondary | ICD-10-CM | POA: Insufficient documentation

## 2011-05-07 DIAGNOSIS — Z452 Encounter for adjustment and management of vascular access device: Secondary | ICD-10-CM

## 2011-05-07 DIAGNOSIS — Z933 Colostomy status: Secondary | ICD-10-CM | POA: Insufficient documentation

## 2011-05-07 DIAGNOSIS — C787 Secondary malignant neoplasm of liver and intrahepatic bile duct: Secondary | ICD-10-CM

## 2011-05-07 DIAGNOSIS — R911 Solitary pulmonary nodule: Secondary | ICD-10-CM | POA: Insufficient documentation

## 2011-05-07 DIAGNOSIS — C20 Malignant neoplasm of rectum: Secondary | ICD-10-CM

## 2011-05-07 DIAGNOSIS — C19 Malignant neoplasm of rectosigmoid junction: Secondary | ICD-10-CM

## 2011-05-07 DIAGNOSIS — K7689 Other specified diseases of liver: Secondary | ICD-10-CM | POA: Insufficient documentation

## 2011-05-07 HISTORY — DX: Secondary malignant neoplasm of liver and intrahepatic bile duct: C78.7

## 2011-05-07 LAB — CMP (CANCER CENTER ONLY)
CO2: 28 mEq/L (ref 18–33)
Calcium: 8.7 mg/dL (ref 8.0–10.3)
Creat: 1 mg/dl (ref 0.6–1.2)
Glucose, Bld: 101 mg/dL (ref 73–118)
Total Bilirubin: 1.1 mg/dl (ref 0.20–1.60)
Total Protein: 6.9 g/dL (ref 6.4–8.1)

## 2011-05-07 LAB — CBC WITH DIFFERENTIAL/PLATELET
Eosinophils Absolute: 0.2 10*3/uL (ref 0.0–0.5)
HCT: 39.7 % (ref 38.4–49.9)
HGB: 13.9 g/dL (ref 13.0–17.1)
LYMPH%: 19.7 % (ref 14.0–49.0)
MONO#: 0.5 10*3/uL (ref 0.1–0.9)
NEUT#: 2.3 10*3/uL (ref 1.5–6.5)
NEUT%: 59.7 % (ref 39.0–75.0)
Platelets: 175 10*3/uL (ref 140–400)
WBC: 3.8 10*3/uL — ABNORMAL LOW (ref 4.0–10.3)

## 2011-05-07 LAB — CEA: CEA: 25.5 ng/mL — ABNORMAL HIGH (ref 0.0–5.0)

## 2011-05-07 MED ORDER — IOHEXOL 300 MG/ML  SOLN
100.0000 mL | Freq: Once | INTRAMUSCULAR | Status: AC | PRN
  Administered 2011-05-07: 100 mL via INTRAVENOUS

## 2011-05-11 ENCOUNTER — Encounter (HOSPITAL_BASED_OUTPATIENT_CLINIC_OR_DEPARTMENT_OTHER): Payer: BC Managed Care – PPO | Admitting: Oncology

## 2011-05-11 DIAGNOSIS — C19 Malignant neoplasm of rectosigmoid junction: Secondary | ICD-10-CM

## 2011-05-17 ENCOUNTER — Other Ambulatory Visit: Payer: Self-pay | Admitting: Oncology

## 2011-05-17 DIAGNOSIS — R160 Hepatomegaly, not elsewhere classified: Secondary | ICD-10-CM

## 2011-05-22 ENCOUNTER — Ambulatory Visit
Admission: RE | Admit: 2011-05-22 | Discharge: 2011-05-22 | Disposition: A | Discharge status: Home / Self Care | Payer: BC Managed Care – PPO | Source: Ambulatory Visit | Attending: Oncology | Admitting: Oncology

## 2011-05-22 VITALS — BP 129/91 | HR 68 | Temp 97.7°F | Resp 16 | Ht 72.0 in | Wt 190.0 lb

## 2011-05-22 DIAGNOSIS — R160 Hepatomegaly, not elsewhere classified: Secondary | ICD-10-CM

## 2011-05-22 HISTORY — DX: Secondary malignant neoplasm of liver and intrahepatic bile duct: C78.7

## 2011-05-23 ENCOUNTER — Other Ambulatory Visit: Payer: Self-pay | Admitting: Oncology

## 2011-05-23 DIAGNOSIS — C189 Malignant neoplasm of colon, unspecified: Secondary | ICD-10-CM

## 2011-05-23 NOTE — Progress Notes (Addendum)
Consult w/ Dr Miles Costain to discuss microwave ablation of a new solitary liver lesion.  Appetite good.  Weight stable.  Denies abd pain, distention or discomfort.  Normal bowel habits.  Denies bloody stools.   Afebrile.  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  Clinically is doing well.  Dr Alver Fisher office to schedule PET CT prior to IR microwave ablation of liver met

## 2011-05-29 ENCOUNTER — Other Ambulatory Visit: Payer: Self-pay | Admitting: Oncology

## 2011-05-29 ENCOUNTER — Encounter (HOSPITAL_COMMUNITY)
Admission: RE | Admit: 2011-05-29 | Discharge: 2011-05-29 | Disposition: A | Discharge status: Home / Self Care | Payer: BC Managed Care – PPO | Source: Ambulatory Visit | Attending: Oncology | Admitting: Oncology

## 2011-05-29 DIAGNOSIS — N400 Enlarged prostate without lower urinary tract symptoms: Secondary | ICD-10-CM | POA: Insufficient documentation

## 2011-05-29 DIAGNOSIS — C7970 Secondary malignant neoplasm of unspecified adrenal gland: Secondary | ICD-10-CM | POA: Insufficient documentation

## 2011-05-29 DIAGNOSIS — K7689 Other specified diseases of liver: Secondary | ICD-10-CM | POA: Insufficient documentation

## 2011-05-29 DIAGNOSIS — C189 Malignant neoplasm of colon, unspecified: Secondary | ICD-10-CM

## 2011-05-29 DIAGNOSIS — Q619 Cystic kidney disease, unspecified: Secondary | ICD-10-CM | POA: Insufficient documentation

## 2011-05-29 MED ORDER — FLUDEOXYGLUCOSE F - 18 (FDG) INJECTION
17.8000 | Freq: Once | INTRAVENOUS | Status: AC | PRN
  Administered 2011-05-29: 17.8 via INTRAVENOUS

## 2011-06-08 ENCOUNTER — Other Ambulatory Visit (HOSPITAL_COMMUNITY): Payer: Self-pay | Admitting: Interventional Radiology

## 2011-06-08 DIAGNOSIS — C787 Secondary malignant neoplasm of liver and intrahepatic bile duct: Secondary | ICD-10-CM

## 2011-07-03 ENCOUNTER — Encounter (HOSPITAL_COMMUNITY): Payer: BC Managed Care – PPO

## 2011-07-03 LAB — CBC
HCT: 38.9 % — ABNORMAL LOW (ref 39.0–52.0)
Hemoglobin: 13.3 g/dL (ref 13.0–17.0)
MCH: 29.6 pg (ref 26.0–34.0)
MCV: 86.6 fL (ref 78.0–100.0)
Platelets: 196 10*3/uL (ref 150–400)
RBC: 4.49 MIL/uL (ref 4.22–5.81)
WBC: 5.7 10*3/uL (ref 4.0–10.5)

## 2011-07-03 LAB — PROTIME-INR
INR: 0.95 (ref 0.00–1.49)
Prothrombin Time: 12.9 seconds (ref 11.6–15.2)

## 2011-07-03 LAB — COMPREHENSIVE METABOLIC PANEL
Albumin: 3.9 g/dL (ref 3.5–5.2)
BUN: 15 mg/dL (ref 6–23)
Creatinine, Ser: 0.73 mg/dL (ref 0.50–1.35)
GFR calc Af Amer: >90 mL/min (ref >90)
Total Protein: 6.9 g/dL (ref 6.0–8.3)

## 2011-07-06 ENCOUNTER — Observation Stay (HOSPITAL_COMMUNITY)
Admission: RE | Admit: 2011-07-06 | Discharge: 2011-07-07 | Disposition: A | Discharge status: Home / Self Care | Payer: BC Managed Care – PPO | Source: Ambulatory Visit | Attending: Interventional Radiology | Admitting: Interventional Radiology

## 2011-07-06 ENCOUNTER — Ambulatory Visit (HOSPITAL_COMMUNITY)
Admission: RE | Admit: 2011-07-06 | Discharge: 2011-07-06 | Disposition: A | Discharge status: Home / Self Care | Payer: BC Managed Care – PPO | Source: Ambulatory Visit | Attending: Interventional Radiology | Admitting: Interventional Radiology

## 2011-07-06 DIAGNOSIS — C787 Secondary malignant neoplasm of liver and intrahepatic bile duct: Secondary | ICD-10-CM | POA: Insufficient documentation

## 2011-07-06 DIAGNOSIS — Z01812 Encounter for preprocedural laboratory examination: Secondary | ICD-10-CM | POA: Insufficient documentation

## 2011-07-06 DIAGNOSIS — Z9049 Acquired absence of other specified parts of digestive tract: Secondary | ICD-10-CM | POA: Insufficient documentation

## 2011-07-06 DIAGNOSIS — Z933 Colostomy status: Secondary | ICD-10-CM | POA: Insufficient documentation

## 2011-07-06 DIAGNOSIS — Z9089 Acquired absence of other organs: Secondary | ICD-10-CM | POA: Insufficient documentation

## 2011-07-06 DIAGNOSIS — K7689 Other specified diseases of liver: Secondary | ICD-10-CM | POA: Insufficient documentation

## 2011-07-06 DIAGNOSIS — C189 Malignant neoplasm of colon, unspecified: Principal | ICD-10-CM | POA: Insufficient documentation

## 2011-07-07 LAB — COMPREHENSIVE METABOLIC PANEL
AST: 70 U/L — ABNORMAL HIGH (ref 0–37)
Albumin: 3.5 g/dL (ref 3.5–5.2)
BUN: 10 mg/dL (ref 6–23)
Calcium: 9 mg/dL (ref 8.4–10.5)
Chloride: 98 mEq/L (ref 96–112)
Creatinine, Ser: 0.63 mg/dL (ref 0.50–1.35)
Total Bilirubin: 1.4 mg/dL — ABNORMAL HIGH (ref 0.3–1.2)
Total Protein: 6.2 g/dL (ref 6.0–8.3)

## 2011-07-07 LAB — CROSSMATCH
ABO/RH(D): A POS
Antibody Screen: NEGATIVE
Unit division: 0

## 2011-07-07 LAB — CBC
MCHC: 33.4 g/dL (ref 30.0–36.0)
MCV: 87.7 fL (ref 78.0–100.0)
Platelets: 173 10*3/uL (ref 150–400)
RDW: 12.7 % (ref 11.5–15.5)
WBC: 7.5 10*3/uL (ref 4.0–10.5)

## 2011-07-19 ENCOUNTER — Other Ambulatory Visit: Payer: Self-pay | Admitting: Oncology

## 2011-07-19 ENCOUNTER — Telehealth: Payer: Self-pay | Admitting: Oncology

## 2011-07-19 ENCOUNTER — Encounter (HOSPITAL_BASED_OUTPATIENT_CLINIC_OR_DEPARTMENT_OTHER): Payer: BC Managed Care – PPO | Admitting: Oncology

## 2011-07-19 DIAGNOSIS — C189 Malignant neoplasm of colon, unspecified: Secondary | ICD-10-CM

## 2011-07-19 DIAGNOSIS — C19 Malignant neoplasm of rectosigmoid junction: Secondary | ICD-10-CM

## 2011-07-19 DIAGNOSIS — Z452 Encounter for adjustment and management of vascular access device: Secondary | ICD-10-CM

## 2011-07-19 LAB — COMPREHENSIVE METABOLIC PANEL
Alkaline Phosphatase: 54 U/L (ref 39–117)
BUN: 14 mg/dL (ref 6–23)
CO2: 23 mEq/L (ref 19–32)
Glucose, Bld: 93 mg/dL (ref 70–99)
Sodium: 139 mEq/L (ref 135–145)
Total Bilirubin: 0.5 mg/dL (ref 0.3–1.2)
Total Protein: 6.6 g/dL (ref 6.0–8.3)

## 2011-07-19 LAB — CBC WITH DIFFERENTIAL/PLATELET
Basophils Absolute: 0 10*3/uL (ref 0.0–0.1)
Eosinophils Absolute: 0.2 10*3/uL (ref 0.0–0.5)
HCT: 38.4 % (ref 38.4–49.9)
HGB: 13.3 g/dL (ref 13.0–17.1)
LYMPH%: 18.2 % (ref 14.0–49.0)
MCV: 87.6 fL (ref 79.3–98.0)
MONO#: 0.5 10*3/uL (ref 0.1–0.9)
MONO%: 12.3 % (ref 0.0–14.0)
NEUT#: 2.8 10*3/uL (ref 1.5–6.5)
NEUT%: 64.4 % (ref 39.0–75.0)
Platelets: 246 10*3/uL (ref 140–400)
WBC: 4.3 10*3/uL (ref 4.0–10.3)

## 2011-07-19 LAB — CEA: CEA: 12.3 ng/mL — ABNORMAL HIGH (ref 0.0–5.0)

## 2011-07-19 NOTE — Telephone Encounter (Signed)
gv pt appt schedule for nov thru feb including ct scan aoot fir 1/29.

## 2011-08-13 ENCOUNTER — Ambulatory Visit (HOSPITAL_BASED_OUTPATIENT_CLINIC_OR_DEPARTMENT_OTHER): Payer: BC Managed Care – PPO

## 2011-08-13 DIAGNOSIS — Z452 Encounter for adjustment and management of vascular access device: Secondary | ICD-10-CM

## 2011-08-13 DIAGNOSIS — C19 Malignant neoplasm of rectosigmoid junction: Secondary | ICD-10-CM

## 2011-08-13 MED ORDER — HEPARIN SOD (PORK) LOCK FLUSH 100 UNIT/ML IV SOLN
500.0000 [IU] | Freq: Once | INTRAVENOUS | Status: AC
  Administered 2011-08-13: 500 [IU] via INTRAVENOUS
  Filled 2011-08-13: qty 5

## 2011-08-13 MED ORDER — SODIUM CHLORIDE 0.9 % IJ SOLN
10.0000 mL | Freq: Once | INTRAMUSCULAR | Status: AC
  Administered 2011-08-13: 10 mL via INTRAVENOUS
  Filled 2011-08-13: qty 10

## 2011-09-27 ENCOUNTER — Other Ambulatory Visit: Payer: Self-pay | Admitting: Interventional Radiology

## 2011-09-27 DIAGNOSIS — C189 Malignant neoplasm of colon, unspecified: Secondary | ICD-10-CM

## 2011-09-27 DIAGNOSIS — C787 Secondary malignant neoplasm of liver and intrahepatic bile duct: Secondary | ICD-10-CM

## 2011-10-16 ENCOUNTER — Encounter: Payer: Self-pay | Admitting: *Deleted

## 2011-10-17 ENCOUNTER — Telehealth: Payer: Self-pay | Admitting: *Deleted

## 2011-10-17 NOTE — Telephone Encounter (Addendum)
Spoke with pt regarding growth on forehead. Per the pt he noticed a spot that just looked like a wart or bump but since November the spot has grown. Pt states it looks to be about 4 mm and itches no pain present at site. Pt just wanted to know if there was anything he needed to do prior to scan on 10/23/11. Notified MD who states continue to monitor site until visit on 10/26/11. Pt aware

## 2011-10-23 ENCOUNTER — Ambulatory Visit (HOSPITAL_COMMUNITY)
Admission: RE | Admit: 2011-10-23 | Discharge: 2011-10-23 | Disposition: A | Discharge status: Home / Self Care | Payer: BC Managed Care – PPO | Source: Ambulatory Visit | Attending: Oncology | Admitting: Oncology

## 2011-10-23 ENCOUNTER — Ambulatory Visit (HOSPITAL_BASED_OUTPATIENT_CLINIC_OR_DEPARTMENT_OTHER): Payer: BC Managed Care – PPO

## 2011-10-23 ENCOUNTER — Other Ambulatory Visit (HOSPITAL_BASED_OUTPATIENT_CLINIC_OR_DEPARTMENT_OTHER): Payer: BC Managed Care – PPO | Admitting: Lab

## 2011-10-23 ENCOUNTER — Other Ambulatory Visit: Payer: Self-pay | Admitting: Oncology

## 2011-10-23 DIAGNOSIS — C19 Malignant neoplasm of rectosigmoid junction: Secondary | ICD-10-CM

## 2011-10-23 DIAGNOSIS — C189 Malignant neoplasm of colon, unspecified: Secondary | ICD-10-CM

## 2011-10-23 DIAGNOSIS — Z452 Encounter for adjustment and management of vascular access device: Secondary | ICD-10-CM

## 2011-10-23 DIAGNOSIS — R918 Other nonspecific abnormal finding of lung field: Secondary | ICD-10-CM | POA: Insufficient documentation

## 2011-10-23 DIAGNOSIS — Z79899 Other long term (current) drug therapy: Secondary | ICD-10-CM | POA: Insufficient documentation

## 2011-10-23 DIAGNOSIS — Z5111 Encounter for antineoplastic chemotherapy: Secondary | ICD-10-CM

## 2011-10-23 DIAGNOSIS — Z923 Personal history of irradiation: Secondary | ICD-10-CM | POA: Insufficient documentation

## 2011-10-23 DIAGNOSIS — Z933 Colostomy status: Secondary | ICD-10-CM | POA: Insufficient documentation

## 2011-10-23 DIAGNOSIS — Z9049 Acquired absence of other specified parts of digestive tract: Secondary | ICD-10-CM | POA: Insufficient documentation

## 2011-10-23 LAB — CBC WITH DIFFERENTIAL/PLATELET
BASO%: 0.3 % (ref 0.0–2.0)
Eosinophils Absolute: 0.2 10*3/uL (ref 0.0–0.5)
HCT: 40.1 % (ref 38.4–49.9)
LYMPH%: 18.4 % (ref 14.0–49.0)
MCHC: 34.7 g/dL (ref 32.0–36.0)
MONO#: 0.5 10*3/uL (ref 0.1–0.9)
NEUT#: 2.8 10*3/uL (ref 1.5–6.5)
Platelets: 213 10*3/uL (ref 140–400)
RBC: 4.6 10*6/uL (ref 4.20–5.82)
WBC: 4.3 10*3/uL (ref 4.0–10.3)
lymph#: 0.8 10*3/uL — ABNORMAL LOW (ref 0.9–3.3)

## 2011-10-23 LAB — CEA: CEA: 19.2 ng/mL — ABNORMAL HIGH (ref 0.0–5.0)

## 2011-10-23 LAB — CMP (CANCER CENTER ONLY)
ALT(SGPT): 22 U/L (ref 10–47)
Albumin: 3.9 g/dL (ref 3.3–5.5)
CO2: 29 mEq/L (ref 18–33)
Calcium: 9 mg/dL (ref 8.0–10.3)
Chloride: 96 mEq/L — ABNORMAL LOW (ref 98–108)
Glucose, Bld: 95 mg/dL (ref 73–118)
Sodium: 141 mEq/L (ref 128–145)
Total Protein: 7.2 g/dL (ref 6.4–8.1)

## 2011-10-23 MED ORDER — IOHEXOL 300 MG/ML  SOLN
100.0000 mL | Freq: Once | INTRAMUSCULAR | Status: AC | PRN
  Administered 2011-10-23: 100 mL via INTRAVENOUS

## 2011-10-23 MED ORDER — HEPARIN SOD (PORK) LOCK FLUSH 100 UNIT/ML IV SOLN
500.0000 [IU] | Freq: Once | INTRAVENOUS | Status: AC
  Administered 2011-10-23: 500 [IU] via INTRAVENOUS
  Filled 2011-10-23: qty 5

## 2011-10-23 MED ORDER — SODIUM CHLORIDE 0.9 % IJ SOLN
10.0000 mL | INTRAMUSCULAR | Status: DC | PRN
  Administered 2011-10-23: 10 mL via INTRAVENOUS
  Filled 2011-10-23: qty 10

## 2011-10-26 ENCOUNTER — Ambulatory Visit (HOSPITAL_BASED_OUTPATIENT_CLINIC_OR_DEPARTMENT_OTHER): Payer: BC Managed Care – PPO | Admitting: Oncology

## 2011-10-26 ENCOUNTER — Telehealth: Payer: Self-pay | Admitting: Oncology

## 2011-10-26 VITALS — BP 137/82 | HR 67 | Temp 97.0°F | Ht 71.0 in | Wt 203.6 lb

## 2011-10-26 DIAGNOSIS — L989 Disorder of the skin and subcutaneous tissue, unspecified: Secondary | ICD-10-CM

## 2011-10-26 DIAGNOSIS — C189 Malignant neoplasm of colon, unspecified: Secondary | ICD-10-CM

## 2011-10-26 NOTE — Progress Notes (Signed)
Hematology and Oncology Follow Up Visit  Christopher Jimenez [id number] January 10, 1951 61 y.o. 10/26/2011 10:35 AM  CC: Christopher Friar, MD  Christopher Jimenez. Christopher Jimenez, M.D.  Christopher Jimenez, Ph.D., M.D.    Principle Diagnosis: A 61 year old gentleman diagnosed with a T3 N1 rectosigmoid adenocarcinoma.  He had 1/14 lymph nodes involved diagnosed in December  2010. 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   Prior Therapy: 1. Underwent a lower anterior resection done in January 2012. 2. Underwent adjuvant radiation therapy with Xeloda concluded in March 2011. 3. Received adjuvant FOLFOX therapy concluded in July 2012. 4. The patient had involvement of his adrenal gland and underwent adrenalectomy on April 2012 under the care of Dr. Johna Jimenez. 5. He is status post a microwave ablation of the hepatic metastasis done July 06, 2011.  Current therapy: Observation and surveillance.  Interim History: [name] presents today for a followup visit.  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  Clinically he feels perfectly well at this time.  He is not reporting any abdominal pain.  He does not report any diarrhea.  Did not report any nausea. Did not report any vomiting.  He had not had any change in his bowel habits.  Really his performance status activity level remains excellent. 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  He does report some occasional fatigue at exertion but had not report any shortness of breath.  Had not reported any cough, not associated with any hemoptysis or hematemesis. He did report a skin lesion on the left side of his head. It is growing by his report.   Medications: I have reviewed the patient's current medications. No current outpatient prescriptions on file.  Allergies: No  Known Allergies  Past Medical History, Surgical history, Social history, and Family History were reviewed and updated.  Review of Systems: Constitutional:  Negative for fever, chills, night sweats, anorexia, weight loss, pain. Cardiovascular: no chest pain or dyspnea on exertion Respiratory: no cough, shortness of breath, or wheezing Neurological: no TIA or stroke symptoms Dermatological: negative ENT: negative Skin: Negative. Gastrointestinal: no abdominal pain, change in bowel habits, or black or bloody stools Genito-Urinary: no dysuria, trouble voiding, or hematuria Hematological and Lymphatic: negative Breast: negative Musculoskeletal: negative Remaining ROS negative. Physical Exam: Blood pressure 137/82, pulse 67, temperature 97 F (36.1 C), temperature source Oral, height 5\' 11"  (1.803 m), weight 203 lb 9.6 oz (92.352 kg). ECOG: 0 General appearance: alert Head: Normocephalic, without obvious abnormality, atraumatic Neck: no adenopathy, no carotid bruit, no JVD, supple, symmetrical, trachea midline and thyroid not enlarged, symmetric, no tenderness/mass/nodules Lymph nodes: Cervical, supraclavicular, and axillary nodes normal. Heart:regular rate and rhythm, S1, S2 normal, no murmur, click, rub or gallop Lung:chest clear, no wheezing, rales, normal symmetric air entry Abdomin: soft, non-tender, without masses or organomegaly EXT:no erythema, induration, or nodules. Skin: a small skin lesion noted on left side of his forehead. No bleeding or irritation noted. It is raised, non-melanotic in nature.    Lab Results: Lab Results  Component Value Date   WBC 4.3 10/23/2011   HGB 13.9 10/23/2011   HCT 40.1 10/23/2011   MCV 87.2 10/23/2011   PLT 213 10/23/2011     Chemistry      Component Value Date/Time   NA 141 10/23/2011  1013   NA 139 07/19/2011 1039   NA 139 07/19/2011 1039   K 4.3 10/23/2011 1013   K 4.3 07/19/2011 1039   K 4.3 07/19/2011 1039   CL 96* 10/23/2011 1013    CL 106 07/19/2011 1039   CL 106 07/19/2011 1039   CO2 29 10/23/2011 1013   CO2 23 07/19/2011 1039   CO2 23 07/19/2011 1039   BUN 14 10/23/2011 1013   BUN 14 07/19/2011 1039   BUN 14 07/19/2011 1039   CREATININE 0.9 10/23/2011 1013   CREATININE 0.87 07/19/2011 1039   CREATININE 0.87 07/19/2011 1039      Component Value Date/Time   CALCIUM 9.0 10/23/2011 1013   CALCIUM 9.3 07/19/2011 1039   CALCIUM 9.3 07/19/2011 1039   ALKPHOS 53 10/23/2011 1013   ALKPHOS 54 07/19/2011 1039   ALKPHOS 54 07/19/2011 1039   AST 17 10/23/2011 1013   AST 12 07/19/2011 1039   AST 12 07/19/2011 1039   ALT 11 07/19/2011 1039   ALT 11 07/19/2011 1039   BILITOT 1.00 10/23/2011 1013   BILITOT 0.5 07/19/2011 1039   BILITOT 0.5 07/19/2011 1039     Results for Christopher Jimenez, Christopher Jimenez (MRN [id number]) as of 10/26/2011 10:40  Ref. Range 10/23/2011 10:11  CEA Latest Range: 0.0-5.0 ng/mL 19.2 (H)    Radiological Studies: CT CHEST WITH ABDOMEN PELVIS BOTH  Technique: Multidetector CT imaging of the abdomen and pelvis was  performed without intravenous contrast. Multidetector CT imaging of  the chest, abdomen and pelvis was then performed during bolus  administration of intravenous contrast.  Contrast: OMNIPAQUE IOHEXOL 300 MG/ML IV SOLN  Comparison: Prior CTs 05/07/2011 and PET CT 05/29/2011. Images  from CT guided microwave ablation 07/06/2011. Abdominal MRI  02/14/2010.  CT CHEST  Findings: There are stable calcified mediastinal and left hilar  lymph nodes. No enlarged lymph nodes are identified. Left  subclavian Port-A-Cath tip is in the lower SVC. There is no  pleural or pericardial effusion.  Numerous less than 4 mm pulmonary nodules are again demonstrated  bilaterally, unchanged. There are no new or enlarging pulmonary  nodules. There is no endobronchial lesion. There are no  suspicious osseous findings.  IMPRESSION:  Stable chest CT with stable scattered small nodules. No evidence  of metastatic disease.    CT ABDOMEN AND PELVIS  Findings: The ablated lesion inferiorly in the right hepatic lobe  measures 3.5 x 1.5 cm. There is a small hyperdense component which  likely represents calcification. This lesion demonstrates no  suspicious enhancement. Today's arterial phase images demonstrate  small hyperenhancing lesions peripherally in the right lobe on  images 27 and 37. These are unchanged from the prior MRI and are  consistent with transient hepatic attenuation differences. No  suspicious liver lesions are identified.  There are stable calcified granulomas in the spleen. The left  adrenal gland has a stable appearance. The right adrenal gland has  been resected. The gallbladder, biliary system and pancreas appear  normal. Bilateral renal cysts have not significantly changed.  Sigmoid colostomy and presacral fibrotic changes are stable. No  enlarged lymph nodes are identified. There is no ascites or  peritoneal nodularity. The prostate gland and bladder appear  unchanged. There are no suspicious osseous findings.  IMPRESSION:  1. Interval ablation of right hepatic metastasis. The ablated  lesion demonstrates no abnormal enhancement.  2. No new metastases identified.  3. Stable appearance of the sigmoid colostomy and presacral  fibrotic changes.   Impression and Plan: This  is a pleasant 61 year old gentleman with the following issues: 1. Stage IV colorectal cancer.  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  He had a liver metastasis that has been ablated.  For the time being, he has no active disease based on his recent CT scan. We will continue on active surveillance and repeat imaging studies in may of  2013.  Certainly if he has wide metastatic disease, then the plan would be to proceed with systemic chemotherapy.  If he has continued to be disease free despite his recent ablation, then we can consider resection of his hepatic metastasis. 2. Port-A-Cath management.  Will set him up  with a Port-A-Cath flush in about 8 weeks. 3. Skin lesion: unclear to me if it is malignant or not. I will refer him to dermatology.     [facility], MD 2/1/201310:35 AM

## 2011-10-26 NOTE — Telephone Encounter (Signed)
apptsd made and printed for 3/28 and 5/28 5/29   aom

## 2011-10-31 ENCOUNTER — Telehealth: Payer: Self-pay | Admitting: *Deleted

## 2011-10-31 NOTE — Telephone Encounter (Signed)
Received call from pt stating that he has noticed increasing amounts of blood in urine since 10/26/11 appt. Notified MD who states to have pt monitor for changes such as pain, discomfort or gross amounts of blood. Returned call to pt at home to give above information. Verbalized understanding

## 2011-11-14 ENCOUNTER — Ambulatory Visit
Admission: RE | Admit: 2011-11-14 | Discharge: 2011-11-14 | Disposition: A | Discharge status: Home / Self Care | Payer: BC Managed Care – PPO | Source: Ambulatory Visit | Attending: Interventional Radiology | Admitting: Interventional Radiology

## 2011-11-14 ENCOUNTER — Telehealth: Payer: Self-pay | Admitting: Oncology

## 2011-11-14 DIAGNOSIS — C787 Secondary malignant neoplasm of liver and intrahepatic bile duct: Secondary | ICD-10-CM

## 2011-11-14 DIAGNOSIS — C189 Malignant neoplasm of colon, unspecified: Secondary | ICD-10-CM

## 2011-11-14 NOTE — Telephone Encounter (Signed)
called pt with 3/25 appt and called the pt and he staets he already ahs one with dr tafeen 2/21.  aom

## 2011-11-15 ENCOUNTER — Other Ambulatory Visit: Payer: Self-pay | Admitting: Interventional Radiology

## 2011-11-15 DIAGNOSIS — C189 Malignant neoplasm of colon, unspecified: Secondary | ICD-10-CM

## 2011-11-15 DIAGNOSIS — C787 Secondary malignant neoplasm of liver and intrahepatic bile duct: Secondary | ICD-10-CM

## 2011-12-20 ENCOUNTER — Other Ambulatory Visit (HOSPITAL_BASED_OUTPATIENT_CLINIC_OR_DEPARTMENT_OTHER): Payer: BC Managed Care – PPO | Admitting: Lab

## 2011-12-20 ENCOUNTER — Other Ambulatory Visit: Payer: Self-pay | Admitting: Emergency Medicine

## 2011-12-20 ENCOUNTER — Ambulatory Visit (HOSPITAL_BASED_OUTPATIENT_CLINIC_OR_DEPARTMENT_OTHER): Payer: BC Managed Care – PPO

## 2011-12-20 VITALS — BP 126/84 | HR 65 | Temp 97.0°F

## 2011-12-20 DIAGNOSIS — Z452 Encounter for adjustment and management of vascular access device: Secondary | ICD-10-CM

## 2011-12-20 DIAGNOSIS — C189 Malignant neoplasm of colon, unspecified: Secondary | ICD-10-CM

## 2011-12-20 DIAGNOSIS — C183 Malignant neoplasm of hepatic flexure: Secondary | ICD-10-CM

## 2011-12-20 LAB — COMPREHENSIVE METABOLIC PANEL
AST: 15 U/L (ref 0–37)
Albumin: 4.4 g/dL (ref 3.5–5.2)
BUN: 18 mg/dL (ref 6–23)
CO2: 27 mEq/L (ref 19–32)
Calcium: 9 mg/dL (ref 8.4–10.5)
Chloride: 105 mEq/L (ref 96–112)
Creatinine, Ser: 0.9 mg/dL (ref 0.50–1.35)
Glucose, Bld: 99 mg/dL (ref 70–99)
Potassium: 4.5 mEq/L (ref 3.5–5.3)

## 2011-12-20 LAB — CBC WITH DIFFERENTIAL/PLATELET
Basophils Absolute: 0 10*3/uL (ref 0.0–0.1)
Eosinophils Absolute: 0.2 10*3/uL (ref 0.0–0.5)
HCT: 40 % (ref 38.4–49.9)
HGB: 13.7 g/dL (ref 13.0–17.1)
MCH: 30.2 pg (ref 27.2–33.4)
MONO#: 0.5 10*3/uL (ref 0.1–0.9)
NEUT#: 2.6 10*3/uL (ref 1.5–6.5)
NEUT%: 61.9 % (ref 39.0–75.0)
RDW: 13.2 % (ref 11.0–14.6)
lymph#: 0.9 10*3/uL (ref 0.9–3.3)

## 2011-12-20 LAB — CEA: CEA: 53.5 ng/mL — ABNORMAL HIGH (ref 0.0–5.0)

## 2011-12-20 MED ORDER — SODIUM CHLORIDE 0.9 % IJ SOLN
10.0000 mL | INTRAMUSCULAR | Status: DC | PRN
  Administered 2011-12-20: 10 mL via INTRAVENOUS
  Filled 2011-12-20: qty 10

## 2011-12-20 MED ORDER — HEPARIN SOD (PORK) LOCK FLUSH 100 UNIT/ML IV SOLN
500.0000 [IU] | Freq: Once | INTRAVENOUS | Status: AC
  Administered 2011-12-20: 500 [IU] via INTRAVENOUS
  Filled 2011-12-20: qty 5

## 2012-02-19 ENCOUNTER — Ambulatory Visit (HOSPITAL_COMMUNITY)
Admission: RE | Admit: 2012-02-19 | Discharge: 2012-02-19 | Disposition: A | Discharge status: Home / Self Care | Payer: BC Managed Care – PPO | Source: Ambulatory Visit | Attending: Oncology | Admitting: Oncology

## 2012-02-19 ENCOUNTER — Ambulatory Visit (HOSPITAL_BASED_OUTPATIENT_CLINIC_OR_DEPARTMENT_OTHER): Payer: BC Managed Care – PPO

## 2012-02-19 ENCOUNTER — Other Ambulatory Visit (HOSPITAL_BASED_OUTPATIENT_CLINIC_OR_DEPARTMENT_OTHER): Payer: BC Managed Care – PPO | Admitting: Lab

## 2012-02-19 DIAGNOSIS — Z9089 Acquired absence of other organs: Secondary | ICD-10-CM | POA: Insufficient documentation

## 2012-02-19 DIAGNOSIS — C189 Malignant neoplasm of colon, unspecified: Secondary | ICD-10-CM

## 2012-02-19 DIAGNOSIS — C787 Secondary malignant neoplasm of liver and intrahepatic bile duct: Secondary | ICD-10-CM | POA: Insufficient documentation

## 2012-02-19 DIAGNOSIS — Z933 Colostomy status: Secondary | ICD-10-CM | POA: Insufficient documentation

## 2012-02-19 DIAGNOSIS — C19 Malignant neoplasm of rectosigmoid junction: Secondary | ICD-10-CM

## 2012-02-19 DIAGNOSIS — R918 Other nonspecific abnormal finding of lung field: Secondary | ICD-10-CM | POA: Insufficient documentation

## 2012-02-19 DIAGNOSIS — N289 Disorder of kidney and ureter, unspecified: Secondary | ICD-10-CM | POA: Insufficient documentation

## 2012-02-19 DIAGNOSIS — Z9221 Personal history of antineoplastic chemotherapy: Secondary | ICD-10-CM | POA: Insufficient documentation

## 2012-02-19 LAB — CMP (CANCER CENTER ONLY)
BUN, Bld: 14 mg/dL (ref 7–22)
CO2: 30 mEq/L (ref 18–33)
Calcium: 8.5 mg/dL (ref 8.0–10.3)
Chloride: 99 mEq/L (ref 98–108)
Creat: 1 mg/dl (ref 0.6–1.2)

## 2012-02-19 MED ORDER — SODIUM CHLORIDE 0.9 % IJ SOLN
10.0000 mL | INTRAMUSCULAR | Status: DC | PRN
  Administered 2012-02-19: 10 mL via INTRAVENOUS
  Filled 2012-02-19: qty 10

## 2012-02-19 MED ORDER — IOHEXOL 300 MG/ML  SOLN
100.0000 mL | Freq: Once | INTRAMUSCULAR | Status: AC | PRN
  Administered 2012-02-19: 100 mL via INTRAVENOUS

## 2012-02-19 MED ORDER — HEPARIN SOD (PORK) LOCK FLUSH 100 UNIT/ML IV SOLN
500.0000 [IU] | Freq: Once | INTRAVENOUS | Status: AC
  Administered 2012-02-19: 500 [IU] via INTRAVENOUS
  Filled 2012-02-19: qty 5

## 2012-02-20 ENCOUNTER — Telehealth: Payer: Self-pay | Admitting: Oncology

## 2012-02-20 ENCOUNTER — Ambulatory Visit (HOSPITAL_BASED_OUTPATIENT_CLINIC_OR_DEPARTMENT_OTHER): Payer: BC Managed Care – PPO | Admitting: Oncology

## 2012-02-20 VITALS — BP 103/61 | HR 63 | Temp 97.0°F | Ht 71.0 in | Wt 196.8 lb

## 2012-02-20 DIAGNOSIS — C787 Secondary malignant neoplasm of liver and intrahepatic bile duct: Secondary | ICD-10-CM

## 2012-02-20 DIAGNOSIS — C189 Malignant neoplasm of colon, unspecified: Secondary | ICD-10-CM

## 2012-02-20 NOTE — Progress Notes (Signed)
Hematology and Oncology Follow Up Visit  Christopher Jimenez [id number] 05/25/1951 61 y.o. 02/20/2012 10:52 AM  CC: Shirley Friar, MD  Ollen Gross. Vernell Morgans, M.D.  Billie Lade, Ph.D., M.D.    Principle Diagnosis: A 61 year old gentleman diagnosed with a T3 N1 rectosigmoid adenocarcinoma.  He had 1/14 lymph nodes involved diagnosed in December  2010. 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   Prior Therapy: 1. Underwent a lower anterior resection done in January 2012. 2. Underwent adjuvant radiation therapy with Xeloda concluded in March 2011. 3. Received adjuvant FOLFOX therapy concluded in July 2012. 4. The patient had involvement of his adrenal gland and underwent adrenalectomy on April 2012 under the care of Dr. Johna Sheriff. 5. He is status post a microwave ablation of the hepatic metastasis done July 06, 2011.  Current therapy: Observation and surveillance.  Interim History: [name] presents today for a followup visit.  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  Clinically he feels perfectly well at this time.  He is not reporting any abdominal pain.  He does not report any diarrhea.  Did not report any nausea. Did not report any vomiting.  He had not had any change in his bowel habits.  Really his performance status activity level remains excellent. Hr recently returned from a trip to [place] and continued to do well.  He does report some occasional fatigue at exertion but had not report any shortness of breath.  Had not reported any cough, not associated with any hemoptysis or hematemesis. He did report a skin lesion on the left side of his head. It is growing by his report.   Medications: I have reviewed the patient's current medications. No current outpatient prescriptions on  file.  Allergies: No Known Allergies  Past Medical History, Surgical history, Social history, and Family History were reviewed and updated.  Review of Systems: Constitutional:  Negative for fever, chills, night sweats, anorexia, weight loss, pain. Cardiovascular: no chest pain or dyspnea on exertion Respiratory: no cough, shortness of breath, or wheezing Neurological: no TIA or stroke symptoms Dermatological: negative ENT: negative Skin: Negative. Gastrointestinal: no abdominal pain, change in bowel habits, or black or bloody stools Genito-Urinary: no dysuria, trouble voiding, or hematuria Hematological and Lymphatic: negative Breast: negative Musculoskeletal: negative Remaining ROS negative. Physical Exam: Blood pressure 103/61, pulse 63, temperature 97 F (36.1 C), temperature source Oral, height 5\' 11"  (1.803 m), weight 196 lb 12.8 oz (89.268 kg). ECOG: 0 General appearance: alert Head: Normocephalic, without obvious abnormality, atraumatic Neck: no adenopathy, no carotid bruit, no JVD, supple, symmetrical, trachea midline and thyroid not enlarged, symmetric, no tenderness/mass/nodules Lymph nodes: Cervical, supraclavicular, and axillary nodes normal. Heart:regular rate and rhythm, S1, S2 normal, no murmur, click, rub or gallop Lung:chest clear, no wheezing, rales, normal symmetric air entry Abdomin: soft, non-tender, without masses or organomegaly EXT:no erythema, induration, or nodules. Skin: a small skin lesion noted on left side of his forehead. No bleeding or irritation noted. It is raised, non-melanotic in nature.    Lab Results: Lab Results  Component Value Date   WBC 4.3 12/20/2011   HGB 13.7 12/20/2011   HCT 40.0 12/20/2011   MCV 88.0 12/20/2011   PLT 208 12/20/2011     Chemistry      Component Value Date/Time   NA 142  02/19/2012 0910   NA 140 12/20/2011 1016   K 5.0* 02/19/2012 0910   K 4.5 12/20/2011 1016   CL 99 02/19/2012 0910   CL 105 12/20/2011 1016   CO2  30 02/19/2012 0910   CO2 27 12/20/2011 1016   BUN 14 02/19/2012 0910   BUN 18 12/20/2011 1016   CREATININE 1.0 02/19/2012 0910   CREATININE 0.90 12/20/2011 1016      Component Value Date/Time   CALCIUM 8.5 02/19/2012 0910   CALCIUM 9.0 12/20/2011 1016   ALKPHOS 53 02/19/2012 0910   ALKPHOS 59 12/20/2011 1016   AST 21 02/19/2012 0910   AST 15 12/20/2011 1016   ALT 13 12/20/2011 1016   BILITOT 0.80 02/19/2012 0910   BILITOT 0.5 12/20/2011 1016     CT CHEST  Findings: Lung windows demonstrate numerous bilateral pulmonary  nodules which measure less than or equal to 4 mm. Some of these  appear slightly more conspicuous today. Favored to be due to  differences in slice selection. Example image 39 on the left upper  lobe/lingula, image 23 right upper lobe, and image 42 in the left  lower lobe.  Soft tissue windows demonstrate no supraclavicular adenopathy.  Normal heart size with minimal anterior pericardial thickening,  unchanged. No pleural effusion. No central pulmonary embolism, on  this non-dedicated study. No mediastinal or hilar adenopathy.  Calcified mediastinal left hilar nodes, consistent with old  granulomatous disease.  IMPRESSION:  1. No acute process or evidence of metastatic disease in the chest.  2. Redemonstration of tiny bilateral pulmonary nodules. Some of  these appear slightly more conspicuous today, favored to be due to  differences in slice selection. Recommend attention on follow-up.  CT ABDOMEN AND PELVIS  Findings: The ablation defect within the subcapsular right lobe of  the liver measures 3.5 x 1.6 cm on image 66. 3.5 x 1.5 cm at same  level on the prior. It is anterior and lateral aspect is similar  to on the prior. There is a suggestion of increased vague  hypoattenuation posteriorly and medially. Compare coronal image 45  of series 602 today to image 50 of series 604 on the prior exam.  Also transverse image 67 series 2 today compared to image 113  series 6 of  the prior. Vague heterogeneous density on image 66 of  series 2 is felt to be similar to on the prior.  No new liver lesion identified.  Old granulomas disease in the spleen. Normal stomach, pancreas,  gallbladder, biliary tract, left adrenal gland. Status post right  adrenalectomy. Bilateral renal cysts. Too small to characterize  bilateral renal lesions. No retroperitoneal or retrocrural  adenopathy.  Surgical changes of rectal stump creation. Presacral fascial  thickening without well-defined mass to suggest recurrent disease.  Small nodes in this area are unchanged and likely reactive.  A descending colostomy. Normal terminal ileum and appendix. Normal  small bowel without abdominal ascites.  No evidence of omental or peritoneal disease. No pelvic  adenopathy. Normal urinary bladder and prostate. No significant  free fluid.  Partial fusion of the bilateral sacroiliac joints. 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 This is unchanged.  IMPRESSION:  1. Ablation defect within the right lobe of the liver. Suggest of  increased hypoattenuation within the inferior and medial portion of  the defect. Cannot exclude locally recurrent disease. Consider  short-term follow-up CT versus further characterization with PET.  2. Otherwise, no evidence of metastatic disease  within the abdomen  or pelvis.    Impression and Plan: This is a pleasant 61 year old gentleman with the following issues: 1. Stage IV colorectal cancer.  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  He had a liver metastasis that has been ablated.  For the time being, he has no active disease based on his recent CT scan. We will continue on active surveillance and repeat imaging studies including a PET CT in 04/2012. Certainly if he has wide metastatic disease, then the plan would be to proceed with systemic chemotherapy.  If he has continued to be disease free despite his recent  ablation, then we can consider resection of his hepatic metastasis. 2. Port-A-Cath management.  Will set him up with a Port-A-Cath flush in about 6 weeks.     [facility], MD 5/29/201310:52 AM

## 2012-02-20 NOTE — Telephone Encounter (Signed)
appts made and printed for pt aom °

## 2012-02-20 NOTE — Progress Notes (Signed)
Cancer policy papers given to ebony Siarah Deleo in managed care.

## 2012-02-21 ENCOUNTER — Encounter: Payer: Self-pay | Admitting: Oncology

## 2012-02-21 NOTE — Progress Notes (Signed)
Put cancer policy on nurse's desk. °

## 2012-02-27 ENCOUNTER — Encounter: Payer: Self-pay | Admitting: Oncology

## 2012-02-27 ENCOUNTER — Encounter: Payer: Self-pay | Admitting: *Deleted

## 2012-02-27 NOTE — Progress Notes (Signed)
Gave Claim form for [employer] to [facility] 02/27/2012.

## 2012-02-27 NOTE — Progress Notes (Signed)
Put patient's [employer] claim form in the registration desk for him to pick up.  Tried to call his cell phone to let him know it is ready, but his voicemail is not set up.

## 2012-03-18 ENCOUNTER — Ambulatory Visit
Admission: RE | Admit: 2012-03-18 | Discharge: 2012-03-18 | Disposition: A | Discharge status: Home / Self Care | Payer: BC Managed Care – PPO | Source: Ambulatory Visit | Attending: Interventional Radiology | Admitting: Interventional Radiology

## 2012-03-18 DIAGNOSIS — C189 Malignant neoplasm of colon, unspecified: Secondary | ICD-10-CM

## 2012-03-18 DIAGNOSIS — C787 Secondary malignant neoplasm of liver and intrahepatic bile duct: Secondary | ICD-10-CM

## 2012-03-18 NOTE — Progress Notes (Signed)
 POST LIVER ABLATION//PT. DENIES ANY SYMPTOMS.  NOT ON CHEMO

## 2012-04-09 ENCOUNTER — Ambulatory Visit (HOSPITAL_BASED_OUTPATIENT_CLINIC_OR_DEPARTMENT_OTHER): Payer: BC Managed Care – PPO

## 2012-04-09 VITALS — BP 123/78 | HR 62 | Temp 97.1°F

## 2012-04-09 DIAGNOSIS — C189 Malignant neoplasm of colon, unspecified: Secondary | ICD-10-CM

## 2012-04-09 DIAGNOSIS — C19 Malignant neoplasm of rectosigmoid junction: Secondary | ICD-10-CM

## 2012-04-09 DIAGNOSIS — Z452 Encounter for adjustment and management of vascular access device: Secondary | ICD-10-CM

## 2012-04-09 MED ORDER — HEPARIN SOD (PORK) LOCK FLUSH 100 UNIT/ML IV SOLN
500.0000 [IU] | Freq: Once | INTRAVENOUS | Status: AC
  Administered 2012-04-09: 500 [IU] via INTRAVENOUS
  Filled 2012-04-09: qty 5

## 2012-04-09 MED ORDER — SODIUM CHLORIDE 0.9 % IJ SOLN
10.0000 mL | INTRAMUSCULAR | Status: DC | PRN
  Administered 2012-04-09: 10 mL via INTRAVENOUS
  Filled 2012-04-09: qty 10

## 2012-04-09 NOTE — Patient Instructions (Signed)
Call MD for problems 

## 2012-04-23 ENCOUNTER — Other Ambulatory Visit: Payer: Self-pay | Admitting: Interventional Radiology

## 2012-04-23 DIAGNOSIS — C787 Secondary malignant neoplasm of liver and intrahepatic bile duct: Secondary | ICD-10-CM

## 2012-04-23 DIAGNOSIS — C189 Malignant neoplasm of colon, unspecified: Secondary | ICD-10-CM

## 2012-05-21 ENCOUNTER — Encounter (HOSPITAL_COMMUNITY): Payer: Self-pay

## 2012-05-21 ENCOUNTER — Other Ambulatory Visit (HOSPITAL_BASED_OUTPATIENT_CLINIC_OR_DEPARTMENT_OTHER): Payer: BC Managed Care – PPO | Admitting: Lab

## 2012-05-21 ENCOUNTER — Ambulatory Visit (HOSPITAL_BASED_OUTPATIENT_CLINIC_OR_DEPARTMENT_OTHER): Payer: BC Managed Care – PPO

## 2012-05-21 ENCOUNTER — Encounter (HOSPITAL_COMMUNITY)
Admission: RE | Admit: 2012-05-21 | Discharge: 2012-05-21 | Disposition: A | Discharge status: Home / Self Care | Payer: BC Managed Care – PPO | Source: Ambulatory Visit | Attending: Oncology | Admitting: Oncology

## 2012-05-21 VITALS — BP 114/83 | HR 66 | Temp 98.0°F | Resp 20

## 2012-05-21 DIAGNOSIS — R599 Enlarged lymph nodes, unspecified: Secondary | ICD-10-CM | POA: Insufficient documentation

## 2012-05-21 DIAGNOSIS — C189 Malignant neoplasm of colon, unspecified: Secondary | ICD-10-CM

## 2012-05-21 DIAGNOSIS — K7689 Other specified diseases of liver: Secondary | ICD-10-CM | POA: Insufficient documentation

## 2012-05-21 DIAGNOSIS — R911 Solitary pulmonary nodule: Secondary | ICD-10-CM | POA: Insufficient documentation

## 2012-05-21 DIAGNOSIS — C19 Malignant neoplasm of rectosigmoid junction: Secondary | ICD-10-CM

## 2012-05-21 LAB — COMPREHENSIVE METABOLIC PANEL (CC13)
BUN: 13 mg/dL (ref 7.0–26.0)
CO2: 27 mEq/L (ref 22–29)
Calcium: 9.4 mg/dL (ref 8.4–10.4)
Chloride: 105 mEq/L (ref 98–107)
Creatinine: 0.9 mg/dL (ref 0.7–1.3)
Total Protein: 6.7 g/dL (ref 6.4–8.3)

## 2012-05-21 LAB — CBC WITH DIFFERENTIAL/PLATELET
Basophils Absolute: 0 10*3/uL (ref 0.0–0.1)
Eosinophils Absolute: 0.3 10*3/uL (ref 0.0–0.5)
HCT: 39.8 % (ref 38.4–49.9)
HGB: 14.2 g/dL (ref 13.0–17.1)
MONO#: 0.7 10*3/uL (ref 0.1–0.9)
NEUT%: 66.9 % (ref 39.0–75.0)
WBC: 5.8 10*3/uL (ref 4.0–10.3)
lymph#: 0.9 10*3/uL (ref 0.9–3.3)

## 2012-05-21 LAB — GLUCOSE, CAPILLARY: Glucose-Capillary: 100 mg/dL — ABNORMAL HIGH (ref 70–99)

## 2012-05-21 MED ORDER — HEPARIN SOD (PORK) LOCK FLUSH 100 UNIT/ML IV SOLN
500.0000 [IU] | Freq: Once | INTRAVENOUS | Status: AC
  Administered 2012-05-21: 500 [IU] via INTRAVENOUS
  Filled 2012-05-21: qty 5

## 2012-05-21 MED ORDER — FLUDEOXYGLUCOSE F - 18 (FDG) INJECTION
16.6000 | Freq: Once | INTRAVENOUS | Status: AC | PRN
  Administered 2012-05-21: 16.6 via INTRAVENOUS

## 2012-05-21 MED ORDER — SODIUM CHLORIDE 0.9 % IJ SOLN
10.0000 mL | INTRAMUSCULAR | Status: DC | PRN
  Administered 2012-05-21: 10 mL via INTRAVENOUS
  Filled 2012-05-21: qty 10

## 2012-05-27 ENCOUNTER — Ambulatory Visit
Admission: RE | Admit: 2012-05-27 | Discharge: 2012-05-27 | Disposition: A | Discharge status: Home / Self Care | Payer: BC Managed Care – PPO | Source: Ambulatory Visit | Attending: Interventional Radiology | Admitting: Interventional Radiology

## 2012-05-27 DIAGNOSIS — C787 Secondary malignant neoplasm of liver and intrahepatic bile duct: Secondary | ICD-10-CM

## 2012-05-27 DIAGNOSIS — C189 Malignant neoplasm of colon, unspecified: Secondary | ICD-10-CM

## 2012-05-27 NOTE — Progress Notes (Signed)
Appetite: good.  States that he experiences "low key" nausea most of the time.  Does not require meds.  Endurance:  Good.    Denies pain.

## 2012-05-28 ENCOUNTER — Ambulatory Visit (HOSPITAL_BASED_OUTPATIENT_CLINIC_OR_DEPARTMENT_OTHER): Payer: BC Managed Care – PPO | Admitting: Oncology

## 2012-05-28 ENCOUNTER — Telehealth: Payer: Self-pay | Admitting: *Deleted

## 2012-05-28 VITALS — BP 115/73 | HR 73 | Temp 96.8°F | Resp 20 | Ht 72.0 in | Wt 177.6 lb

## 2012-05-28 DIAGNOSIS — C189 Malignant neoplasm of colon, unspecified: Secondary | ICD-10-CM | POA: Insufficient documentation

## 2012-05-28 DIAGNOSIS — C19 Malignant neoplasm of rectosigmoid junction: Secondary | ICD-10-CM

## 2012-05-28 DIAGNOSIS — C787 Secondary malignant neoplasm of liver and intrahepatic bile duct: Secondary | ICD-10-CM | POA: Insufficient documentation

## 2012-05-28 MED ORDER — ONDANSETRON HCL 8 MG PO TABS: 8.0000 mg | ORAL_TABLET | Freq: Three times a day (TID) | ORAL | Status: AC | PRN

## 2012-05-28 MED ORDER — DRONABINOL 2.5 MG PO CAPS: 2.5000 mg | ORAL_CAPSULE | Freq: Two times a day (BID) | ORAL | Status: AC

## 2012-05-28 MED ORDER — LOPERAMIDE HCL 2 MG PO CAPS: 2.0000 mg | ORAL_CAPSULE | Freq: Four times a day (QID) | ORAL | Status: AC | PRN

## 2012-05-28 NOTE — Telephone Encounter (Signed)
lab + chemo on 10/29 and 10/15  Gave patient appointment for 07-22-2012 and 07-08-2012   Emailed michelle to set up patients treatment

## 2012-05-28 NOTE — Progress Notes (Signed)
Hematology and Oncology Follow Up Visit  Christopher Jimenez [id number] 09-06-1951 61 y.o. 05/28/2012 10:36 AM  CC: Christopher Friar, MD  Christopher Jimenez. Christopher Jimenez, M.D.  Christopher Jimenez, Ph.D., M.D.    Principle Diagnosis: A 61 year old gentleman diagnosed with a T3 N1 rectosigmoid adenocarcinoma.  He had 1/14 lymph nodes involved diagnosed in December  2010. 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   Prior Therapy: 1. Underwent a lower anterior resection done in January 2012. 2. Underwent adjuvant radiation therapy with Xeloda concluded in March 2011. 3. Received adjuvant FOLFOX therapy concluded in July 2012. 4. The patient had involvement of his adrenal gland and underwent adrenalectomy on April 2012 under the care of Christopher Jimenez. 5. He is status post a microwave ablation of the hepatic metastasis done July 06, 2011.  Current therapy: Observation and surveillance. He is under evaluation foe chemotherapy.   Interim History: Christopher Jimenez presents today for a followup visit.  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  Clinically he feels perfectly well at this time.  He is not reporting any abdominal pain.  He does not report any diarrhea.  Did not report any nausea. Did not report any vomiting.  He had not had any change in his bowel habits.  Really his performance status activity level remains excellent.  He does report some occasional fatigue at exertion but had not report any shortness of breath.  Had not reported any cough, not associated with any hemoptysis or hematemesis. He did report a skin lesion on the left side of his head. It is growing by his report.   Medications: I have reviewed the patient's current medications. Current outpatient prescriptions:dronabinol (MARINOL) 2.5 MG capsule, Take 1 capsule  (2.5 mg total) by mouth 2 (two) times daily before a meal., Disp: 30 capsule, Rfl: 1;  loperamide (IMODIUM) 2 MG capsule, Take 1 capsule (2 mg total) by mouth 4 (four) times daily as needed for diarrhea or loose stools., Disp: 30 capsule, Rfl: 1 ondansetron (ZOFRAN) 8 MG tablet, Take 1 tablet (8 mg total) by mouth every 8 (eight) hours as needed for nausea., Disp: 20 tablet, Rfl: 1  Allergies: No Known Allergies  Past Medical History, Surgical history, Social history, and Family History were reviewed and updated.  Review of Systems: Constitutional:  Negative for fever, chills, night sweats, anorexia, weight loss, pain. Cardiovascular: no chest pain or dyspnea on exertion Respiratory: no cough, shortness of breath, or wheezing Neurological: no TIA or stroke symptoms Dermatological: negative ENT: negative Skin: Negative. Gastrointestinal: no abdominal pain, change in bowel habits, or black or bloody stools Genito-Urinary: no dysuria, trouble voiding, or hematuria Hematological and Lymphatic: negative Breast: negative Musculoskeletal: negative Remaining ROS negative. Physical Exam: Blood pressure 115/73, pulse 73, temperature 96.8 F (36 C), temperature source Oral, resp. rate 20, height 6' (1.829 m), weight 177 lb 9.6 oz (80.559 kg). ECOG: 0 General appearance: alert Head: Normocephalic, without obvious abnormality, atraumatic Neck: no adenopathy, no carotid bruit, no JVD, supple, symmetrical, trachea midline and thyroid not enlarged, symmetric, no tenderness/mass/nodules Lymph nodes: Cervical, supraclavicular, and axillary nodes normal. Heart:regular rate and rhythm, S1, S2 normal, no murmur, click, rub or gallop Lung:chest clear, no wheezing, rales, normal symmetric air entry Abdomin: soft, non-tender, without masses or organomegaly EXT:no erythema, induration, or nodules. Skin: a small  skin lesion noted on left side of his forehead. No bleeding or irritation noted. It is raised,  non-melanotic in nature.    Lab Results: Lab Results  Component Value Date   WBC 5.8 05/21/2012   HGB 14.2 05/21/2012   HCT 39.8 05/21/2012   MCV 88.0 05/21/2012   PLT 186 05/21/2012     Chemistry      Component Value Date/Time   NA 140 05/21/2012 0803   NA 142 02/19/2012 0910   NA 140 12/20/2011 1016   K 4.2 05/21/2012 0803   K 5.0* 02/19/2012 0910   K 4.5 12/20/2011 1016   CL 105 05/21/2012 0803   CL 99 02/19/2012 0910   CL 105 12/20/2011 1016   CO2 27 05/21/2012 0803   CO2 30 02/19/2012 0910   CO2 27 12/20/2011 1016   BUN 13.0 05/21/2012 0803   BUN 14 02/19/2012 0910   BUN 18 12/20/2011 1016   CREATININE 0.9 05/21/2012 0803   CREATININE 1.0 02/19/2012 0910   CREATININE 0.90 12/20/2011 1016      Component Value Date/Time   CALCIUM 9.4 05/21/2012 0803   CALCIUM 8.5 02/19/2012 0910   CALCIUM 9.0 12/20/2011 1016   ALKPHOS 64 05/21/2012 0803   ALKPHOS 53 02/19/2012 0910   ALKPHOS 59 12/20/2011 1016   AST 15 05/21/2012 0803   AST 21 02/19/2012 0910   AST 15 12/20/2011 1016   ALT 14 05/21/2012 0803   ALT 13 12/20/2011 1016   BILITOT 1.30* 05/21/2012 0803   BILITOT 0.80 02/19/2012 0910   BILITOT 0.5 12/20/2011 1016     NUCLEAR MEDICINE PET SKULL BASE TO THIGH  Fasting Blood Glucose: 100  Technique: 16.6 mCi F-18 FDG was injected intravenously. CT data  was obtained and used for attenuation correction and anatomic  localization only. (This was not acquired as a diagnostic CT  examination.) Additional exam technical data entered on  technologist worksheet.  Comparison: 05/29/2011  Findings:  Neck: No hypermetabolic lymph nodes in the neck.  Chest: Hypermetabolic right paratracheal lymph node is identified.  This measures 1.8 cm and has an SUV max equal to 10.5 this is a new  finding compared with previous exam. No pericardial or pleural  effusion. Tiny nodule within the left upper lobe is too small to  characterize measuring 6.9 mm, image 93. Previously this nodule  measured 3.9 mm.    Abdomen/Pelvis: Hypermetabolic lesion within the right hepatic  lobe measure 2.7 cm, image 136. The SUV max associated this lesion  is equal to 10.4. Previously this lesion measured 1.6 cm and had  an SUV by equal to 5.8.No abnormal hypermetabolic activity within  the pancreas, adrenal glands, or spleen. No hypermetabolic lymph  nodes in the abdomen or pelvis.  [place]: No focal hypermetabolic activity to suggest skeletal  metastasis.  IMPRESSION:  1. Interval progression of disease.  2. New hypermetabolic enlarged right paratracheal lymph node.  3. Increase in size and degree of FDG uptake associated with the  right hepatic lobe lesion.    Impression and Plan: This is a pleasant 61 year old gentleman with the following issues: 1. Stage IV colorectal cancer.  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  He had a liver metastasis that has been ablated.  PET ct scan result discussed today and he has progressed systemically at this time. Risks and benefits of systemic chemotherapy (FOLFIRI + Avastin) was discussed and he is agreeable t proceed starting on 10/15 after a trip out of town.  Rx for  Zofran, Marinol, and imodium given to the patient today. I plan to give him 2 months of therapy and repeat scans at this time.  2. Port-A-Cath management.  Will set him up with a Port-A-Cath flush in about 6 weeks.     [facility], MD 9/4/201310:36 AM

## 2012-05-28 NOTE — Telephone Encounter (Signed)
Per staff message and POF I have scheduled appts.  JMW  

## 2012-07-04 ENCOUNTER — Other Ambulatory Visit: Payer: Self-pay | Admitting: *Deleted

## 2012-07-04 ENCOUNTER — Telehealth: Payer: Self-pay | Admitting: *Deleted

## 2012-07-04 DIAGNOSIS — C189 Malignant neoplasm of colon, unspecified: Secondary | ICD-10-CM

## 2012-07-04 NOTE — Telephone Encounter (Signed)
Patient called regarding his apts and rescheduling. I have forwarded the message to the desk RN. JMW

## 2012-07-04 NOTE — Telephone Encounter (Signed)
Per patient voicemail regarding his appts and rescheduling. I have forwarded the message to the desk RN. Per desk RN cx all appts for next week, keep appts for week of 10/29. Then to make appts for week of 11/11. Appts eade and patient called. JMW

## 2012-07-08 ENCOUNTER — Other Ambulatory Visit: Payer: BC Managed Care – PPO | Admitting: Lab

## 2012-07-08 ENCOUNTER — Ambulatory Visit: Payer: BC Managed Care – PPO | Admitting: Oncology

## 2012-07-08 ENCOUNTER — Inpatient Hospital Stay: Payer: BC Managed Care – PPO

## 2012-07-22 ENCOUNTER — Encounter: Payer: Self-pay | Admitting: Oncology

## 2012-07-22 ENCOUNTER — Ambulatory Visit (HOSPITAL_BASED_OUTPATIENT_CLINIC_OR_DEPARTMENT_OTHER): Payer: BC Managed Care – PPO

## 2012-07-22 ENCOUNTER — Encounter: Payer: Self-pay | Admitting: *Deleted

## 2012-07-22 ENCOUNTER — Ambulatory Visit (HOSPITAL_BASED_OUTPATIENT_CLINIC_OR_DEPARTMENT_OTHER): Payer: BC Managed Care – PPO | Admitting: Oncology

## 2012-07-22 ENCOUNTER — Other Ambulatory Visit (HOSPITAL_BASED_OUTPATIENT_CLINIC_OR_DEPARTMENT_OTHER): Payer: BC Managed Care – PPO | Admitting: Lab

## 2012-07-22 VITALS — BP 98/53 | HR 82 | Temp 98.6°F | Resp 16

## 2012-07-22 VITALS — BP 104/68 | HR 70 | Temp 97.1°F | Resp 18 | Ht 72.0 in | Wt 197.0 lb

## 2012-07-22 DIAGNOSIS — C189 Malignant neoplasm of colon, unspecified: Secondary | ICD-10-CM

## 2012-07-22 DIAGNOSIS — C19 Malignant neoplasm of rectosigmoid junction: Secondary | ICD-10-CM

## 2012-07-22 DIAGNOSIS — C787 Secondary malignant neoplasm of liver and intrahepatic bile duct: Secondary | ICD-10-CM

## 2012-07-22 DIAGNOSIS — Z5112 Encounter for antineoplastic immunotherapy: Secondary | ICD-10-CM

## 2012-07-22 DIAGNOSIS — C7970 Secondary malignant neoplasm of unspecified adrenal gland: Secondary | ICD-10-CM

## 2012-07-22 DIAGNOSIS — Z5111 Encounter for antineoplastic chemotherapy: Secondary | ICD-10-CM

## 2012-07-22 LAB — CBC WITH DIFFERENTIAL/PLATELET
Basophils Absolute: 0.1 10*3/uL (ref 0.0–0.1)
EOS%: 5.3 % (ref 0.0–7.0)
HGB: 13.8 g/dL (ref 13.0–17.1)
MCH: 30.2 pg (ref 27.2–33.4)
NEUT#: 3.3 10*3/uL (ref 1.5–6.5)
RDW: 12.9 % (ref 11.0–14.6)
lymph#: 1 10*3/uL (ref 0.9–3.3)

## 2012-07-22 LAB — COMPREHENSIVE METABOLIC PANEL (CC13)
ALT: 32 U/L (ref 0–55)
AST: 24 U/L (ref 5–34)
Albumin: 4.2 g/dL (ref 3.5–5.0)
Alkaline Phosphatase: 89 U/L (ref 40–150)
Glucose: 98 mg/dl (ref 70–99)
Potassium: 4.3 mEq/L (ref 3.5–5.1)
Sodium: 139 mEq/L (ref 136–145)
Total Protein: 6.7 g/dL (ref 6.4–8.3)

## 2012-07-22 MED ORDER — LEUCOVORIN CALCIUM INJECTION 350 MG
396.0000 mg/m2 | Freq: Once | INTRAVENOUS | Status: AC
  Administered 2012-07-22: 800 mg via INTRAVENOUS
  Filled 2012-07-22: qty 40

## 2012-07-22 MED ORDER — DEXAMETHASONE SODIUM PHOSPHATE 4 MG/ML IJ SOLN
20.0000 mg | Freq: Once | INTRAMUSCULAR | Status: AC
  Administered 2012-07-22: 20 mg via INTRAVENOUS

## 2012-07-22 MED ORDER — SODIUM CHLORIDE 0.9 % IV SOLN: 5.0000 mg/kg | Freq: Once | INTRAVENOUS | Status: DC

## 2012-07-22 MED ORDER — SODIUM CHLORIDE 0.9 % IV SOLN: Freq: Once | INTRAVENOUS | Status: DC

## 2012-07-22 MED ORDER — SODIUM CHLORIDE 0.9 % IV SOLN
2400.0000 mg/m2 | INTRAVENOUS | Status: DC
  Administered 2012-07-22: 4850 mg via INTRAVENOUS
  Filled 2012-07-22: qty 97

## 2012-07-22 MED ORDER — SODIUM CHLORIDE 0.9 % IV SOLN
5.0000 mg/kg | Freq: Once | INTRAVENOUS | Status: AC
  Administered 2012-07-22: 450 mg via INTRAVENOUS
  Filled 2012-07-22: qty 18

## 2012-07-22 MED ORDER — FLUOROURACIL CHEMO INJECTION 2.5 GM/50ML
400.0000 mg/m2 | Freq: Once | INTRAVENOUS | Status: AC
  Administered 2012-07-22: 800 mg via INTRAVENOUS
  Filled 2012-07-22: qty 16

## 2012-07-22 MED ORDER — IRINOTECAN HCL CHEMO INJECTION 100 MG/5ML
180.0000 mg/m2 | Freq: Once | INTRAVENOUS | Status: AC
  Administered 2012-07-22: 364 mg via INTRAVENOUS
  Filled 2012-07-22: qty 18.2

## 2012-07-22 MED ORDER — ONDANSETRON 16 MG/50ML IVPB (CHCC)
16.0000 mg | Freq: Once | INTRAVENOUS | Status: AC
  Administered 2012-07-22: 16 mg via INTRAVENOUS

## 2012-07-22 NOTE — Patient Instructions (Signed)
Christopher Jimenez 08-01-1951 [id number]  [facility] Discharge Instructions for Patients Receiving Chemotherapy  Today you received the following chemotherapy agents: Irinotecan, 5-FU, Leucovorin, Avastin   To help prevent nausea and vomiting after your treatment, we encourage you to take your nausea medication as prescribed; if you develop nausea and vomiting that is not controlled by your nausea medication, call the clinic.   BELOW ARE SYMPTOMS THAT SHOULD BE REPORTED IMMEDIATELY:  *FEVER GREATER THAN 100.5 F *CHILLS WITH OR WITHOUT FEVER *UNUSUAL SHORTNESS OF BREATH *UNUSUAL BRUISING OR BLEEDING *URINARY PROBLEMS *BOWEL PROBLEMS  I have been informed and understand all the instructions given to me.   Please call the [facility] at [phone] during business hours should you have any further questions or need assistance in obtaining follow-up care. If you have a medical emergency, please dial 911.

## 2012-07-22 NOTE — Progress Notes (Signed)
Hematology and Oncology Follow Up Visit  Christopher Jimenez [id number] 1951/09/16 61 y.o. 07/22/2012 4:19 PM  CC: Shirley Friar, MD  Ollen Gross. Vernell Morgans, M.D.  Billie Lade, Ph.D., M.D.    Principle Diagnosis: A 61 year old gentleman diagnosed with a T3 N1 rectosigmoid adenocarcinoma.  He had 1/14 lymph nodes involved diagnosed in December  2010. 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   Prior Therapy: 1. Underwent a lower anterior resection done in January 2012. 2. Underwent adjuvant radiation therapy with Xeloda concluded in March 2011. 3. Received adjuvant FOLFOX therapy concluded in July 2012. 4. The patient had involvement of his adrenal gland and underwent adrenalectomy on April 2012 under the care of Dr. Johna Sheriff. 5. He is status post a microwave ablation of the hepatic metastasis done July 06, 2011.  Current therapy: Here to begin systemic chemotherapy with FOLFIRI/Avastin   Interim History: Christopher Jimenez presents today for a followup visit.  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  Clinically he feels perfectly well at this time.  He is not reporting any abdominal pain.  He does not report any diarrhea.  Did not report any nausea. Did not report any vomiting.  He had not had any change in his bowel habits.  Really his performance status activity level remains excellent.  He does report some occasional fatigue at exertion but had not report any shortness of breath.  Had not reported any cough, not associated with any hemoptysis or hematemesis. He did report a skin lesion on the left side of his head. It is growing by his report.   Medications: I have reviewed the patient's current medications. No current outpatient prescriptions on file. No current facility-administered medications for  this visit. Facility-Administered Medications Ordered in Other Visits: bevacizumab (AVASTIN) 450 mg in sodium chloride 0.9 % 100 mL chemo infusion, 5 mg/kg (Order-Specific), Intravenous, Once, Benjiman Core, MD, 450 mg at 07/22/12 1004;  dexamethasone (DECADRON) injection 20 mg, 20 mg, Intravenous, Once, Benjiman Core, MD, 20 mg at 07/22/12 0935 fluorouracil (ADRUCIL) chemo injection 800 mg, 400 mg/m2 (Treatment Plan Actual), Intravenous, Once, Benjiman Core, MD, 800 mg at 07/22/12 1203;  irinotecan (CAMPTOSAR) 364 mg in dextrose 5 % 500 mL chemo infusion, 180 mg/m2 (Treatment Plan Actual), Intravenous, Once, Benjiman Core, MD, 364 mg at 07/22/12 1017 leucovorin 800 mg in dextrose 5 % 250 mL infusion, 396 mg/m2 (Treatment Plan Actual), Intravenous, Once, Benjiman Core, MD, 800 mg at 07/22/12 1017;  ondansetron (ZOFRAN) IVPB 16 mg, 16 mg, Intravenous, Once, Benjiman Core, MD, 16 mg at 07/22/12 0935;  DISCONTD: 0.9 %  sodium chloride infusion, , Intravenous, Once, Benjiman Core, MD DISCONTD: bevacizumab (AVASTIN) 400 mg in sodium chloride 0.9 % 100 mL chemo infusion, 5 mg/kg (Treatment Plan Actual), Intravenous, Once, Benjiman Core, MD;  DISCONTD: fluorouracil (ADRUCIL) 4,850 mg in sodium chloride 0.9 % 150 mL chemo infusion, 2,400 mg/m2 (Treatment Plan Actual), Intravenous, 1 day or 1 dose, Benjiman Core, MD, 4,850 mg at 07/22/12 1208  Allergies: No Known Allergies  Past Medical History, Surgical history, Social history, and Family History were reviewed and updated.  Review of Systems: Constitutional:  Negative for fever, chills, night sweats, anorexia, weight loss, pain. Cardiovascular: no chest pain or dyspnea on exertion Respiratory: no cough, shortness  of breath, or wheezing Neurological: no TIA or stroke symptoms Dermatological: negative ENT: negative Skin: Negative. Gastrointestinal: no abdominal pain, change in bowel habits, or black or bloody stools Genito-Urinary: no dysuria,  trouble voiding, or hematuria Hematological and Lymphatic: negative Breast: negative Musculoskeletal: negative Remaining ROS negative.  Physical Exam: Blood pressure 104/68, pulse 70, temperature 97.1 F (36.2 C), temperature source Oral, resp. rate 18, height 6' (1.829 m), weight 197 lb (89.359 kg). ECOG: 0 General appearance: alert Head: Normocephalic, without obvious abnormality, atraumatic Neck: no adenopathy, no carotid bruit, no JVD, supple, symmetrical, trachea midline and thyroid not enlarged, symmetric, no tenderness/mass/nodules Lymph nodes: Cervical, supraclavicular, and axillary nodes normal. Heart:regular rate and rhythm, S1, S2 normal, no murmur, click, rub or gallop Lung:chest clear, no wheezing, rales, normal symmetric air entry Abdomin: soft, non-tender, without masses or organomegaly EXT:no erythema, induration, or nodules. Skin: a small skin lesion noted on left side of his forehead. No bleeding or irritation noted. It is raised, non-melanotic in nature.    Lab Results: Lab Results  Component Value Date   WBC 5.4 07/22/2012   HGB 13.8 07/22/2012   HCT 40.1 07/22/2012   MCV 87.7 07/22/2012   PLT 200 07/22/2012     Chemistry      Component Value Date/Time   NA 139 07/22/2012 0823   NA 142 02/19/2012 0910   NA 140 12/20/2011 1016   K 4.3 07/22/2012 0823   K 5.0* 02/19/2012 0910   K 4.5 12/20/2011 1016   CL 106 07/22/2012 0823   CL 99 02/19/2012 0910   CL 105 12/20/2011 1016   CO2 26 07/22/2012 0823   CO2 30 02/19/2012 0910   CO2 27 12/20/2011 1016   BUN 13.0 07/22/2012 0823   BUN 14 02/19/2012 0910   BUN 18 12/20/2011 1016   CREATININE 0.9 07/22/2012 0823   CREATININE 1.0 02/19/2012 0910   CREATININE 0.90 12/20/2011 1016      Component Value Date/Time   CALCIUM 9.6 07/22/2012 0823   CALCIUM 8.5 02/19/2012 0910   CALCIUM 9.0 12/20/2011 1016   ALKPHOS 89 07/22/2012 0823   ALKPHOS 53 02/19/2012 0910   ALKPHOS 59 12/20/2011 1016   AST 24 07/22/2012 0823   AST  21 02/19/2012 0910   AST 15 12/20/2011 1016   ALT 32 07/22/2012 0823   ALT 13 12/20/2011 1016   BILITOT 0.79 07/22/2012 0823   BILITOT 0.80 02/19/2012 0910   BILITOT 0.5 12/20/2011 1016      Impression and Plan: This is a pleasant 61 year old gentleman with the following issues: 1. Stage IV colorectal cancer.  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  He had a liver metastasis that has been ablated.  PET scan showed disease progression. He will proceed with cycle 1 of FOLFIRI/Avastin today. I plan to give him 2 months of therapy and repeat scans at this time.  2. Port-A-Cath management.  PAC in place for chemotherapy. 3. Follow-up. In 2 weeks for cycle 2.     Christopher Jimenez 10/29/20134:19 PM

## 2012-07-23 ENCOUNTER — Other Ambulatory Visit: Payer: Self-pay | Admitting: Certified Registered Nurse Anesthetist

## 2012-07-24 ENCOUNTER — Telehealth: Payer: Self-pay | Admitting: *Deleted

## 2012-07-24 ENCOUNTER — Ambulatory Visit (HOSPITAL_BASED_OUTPATIENT_CLINIC_OR_DEPARTMENT_OTHER): Payer: BC Managed Care – PPO

## 2012-07-24 VITALS — BP 143/83 | HR 69 | Temp 98.0°F

## 2012-07-24 DIAGNOSIS — C787 Secondary malignant neoplasm of liver and intrahepatic bile duct: Secondary | ICD-10-CM

## 2012-07-24 DIAGNOSIS — C19 Malignant neoplasm of rectosigmoid junction: Secondary | ICD-10-CM

## 2012-07-24 DIAGNOSIS — C189 Malignant neoplasm of colon, unspecified: Secondary | ICD-10-CM

## 2012-07-24 MED ORDER — HEPARIN SOD (PORK) LOCK FLUSH 100 UNIT/ML IV SOLN
500.0000 [IU] | Freq: Once | INTRAVENOUS | Status: AC | PRN
  Administered 2012-07-24: 500 [IU]
  Filled 2012-07-24: qty 5

## 2012-07-24 MED ORDER — SODIUM CHLORIDE 0.9 % IJ SOLN
10.0000 mL | INTRAMUSCULAR | Status: DC | PRN
  Administered 2012-07-24: 10 mL
  Filled 2012-07-24: qty 10

## 2012-07-24 NOTE — Patient Instructions (Signed)
Call MD for problems 

## 2012-07-24 NOTE — Telephone Encounter (Signed)
Message copied by Augusto Garbe on Thu Jul 24, 2012  1:54 PM ------      Message from: Sherre Poot      Created: Tue Jul 22, 2012 10:23 AM      Regarding: Chemo F/U Call       First Avastin/Folfiri- Dr. Clelia Croft

## 2012-07-24 NOTE — Telephone Encounter (Signed)
[name] is doing well.  States everything the last few days has been uneventful.  Denies any side effects or symptoms.  Encouraged to eat and drink lots of fluids and to call if any questions.  His only question today was about next appointments.  Dates and times given.

## 2012-08-01 ENCOUNTER — Other Ambulatory Visit: Payer: Self-pay | Admitting: Oncology

## 2012-08-01 DIAGNOSIS — C189 Malignant neoplasm of colon, unspecified: Secondary | ICD-10-CM

## 2012-08-04 ENCOUNTER — Other Ambulatory Visit (HOSPITAL_BASED_OUTPATIENT_CLINIC_OR_DEPARTMENT_OTHER): Payer: BC Managed Care – PPO | Admitting: Lab

## 2012-08-04 ENCOUNTER — Ambulatory Visit (HOSPITAL_BASED_OUTPATIENT_CLINIC_OR_DEPARTMENT_OTHER): Payer: BC Managed Care – PPO | Admitting: Oncology

## 2012-08-04 VITALS — BP 114/71 | HR 63 | Temp 97.5°F | Resp 20 | Ht 72.0 in | Wt 196.4 lb

## 2012-08-04 DIAGNOSIS — C189 Malignant neoplasm of colon, unspecified: Secondary | ICD-10-CM

## 2012-08-04 DIAGNOSIS — C787 Secondary malignant neoplasm of liver and intrahepatic bile duct: Secondary | ICD-10-CM

## 2012-08-04 DIAGNOSIS — C19 Malignant neoplasm of rectosigmoid junction: Secondary | ICD-10-CM

## 2012-08-04 DIAGNOSIS — C7970 Secondary malignant neoplasm of unspecified adrenal gland: Secondary | ICD-10-CM

## 2012-08-04 DIAGNOSIS — C20 Malignant neoplasm of rectum: Secondary | ICD-10-CM

## 2012-08-04 LAB — CBC WITH DIFFERENTIAL/PLATELET
BASO%: 0.3 % (ref 0.0–2.0)
EOS%: 5.3 % (ref 0.0–7.0)
Eosinophils Absolute: 0.2 10*3/uL (ref 0.0–0.5)
MCV: 86.3 fL (ref 79.3–98.0)
MONO%: 11.5 % (ref 0.0–14.0)
NEUT#: 1.7 10*3/uL (ref 1.5–6.5)
RBC: 4.38 10*6/uL (ref 4.20–5.82)
RDW: 12.6 % (ref 11.0–14.6)

## 2012-08-04 LAB — COMPREHENSIVE METABOLIC PANEL (CC13)
ALT: 29 U/L (ref 0–55)
AST: 20 U/L (ref 5–34)
Albumin: 3.9 g/dL (ref 3.5–5.0)
Alkaline Phosphatase: 93 U/L (ref 40–150)
Glucose: 94 mg/dl (ref 70–99)
Potassium: 4.3 mEq/L (ref 3.5–5.1)
Sodium: 138 mEq/L (ref 136–145)
Total Protein: 6.6 g/dL (ref 6.4–8.3)

## 2012-08-04 NOTE — Patient Instructions (Signed)
Diarrhea:  You may use Imodium 2 tabs after first loose stool then 1 tabs after each additional loose stool. Maximum of 8 tabs in a 24 hours period.  Constipation:  You may use a stool softener like Colace or docusate sodium 1 tab twice a day. Add Miralax 1/2-1 capful daily if the stool softener is not effective.  Mouth sores:   salt/baking soda (1 teaspoon of salt; 1 teaspoon of baking soda; in 1 Liter of water).

## 2012-08-05 ENCOUNTER — Encounter: Payer: Self-pay | Admitting: Oncology

## 2012-08-05 ENCOUNTER — Ambulatory Visit (HOSPITAL_BASED_OUTPATIENT_CLINIC_OR_DEPARTMENT_OTHER): Payer: BC Managed Care – PPO

## 2012-08-05 VITALS — BP 126/84 | HR 79 | Temp 97.7°F | Resp 20

## 2012-08-05 DIAGNOSIS — C7970 Secondary malignant neoplasm of unspecified adrenal gland: Secondary | ICD-10-CM

## 2012-08-05 DIAGNOSIS — C787 Secondary malignant neoplasm of liver and intrahepatic bile duct: Secondary | ICD-10-CM

## 2012-08-05 DIAGNOSIS — C189 Malignant neoplasm of colon, unspecified: Secondary | ICD-10-CM

## 2012-08-05 DIAGNOSIS — Z5111 Encounter for antineoplastic chemotherapy: Secondary | ICD-10-CM

## 2012-08-05 DIAGNOSIS — C19 Malignant neoplasm of rectosigmoid junction: Secondary | ICD-10-CM

## 2012-08-05 MED ORDER — FLUOROURACIL CHEMO INJECTION 2.5 GM/50ML
400.0000 mg/m2 | Freq: Once | INTRAVENOUS | Status: AC
  Administered 2012-08-05: 800 mg via INTRAVENOUS
  Filled 2012-08-05: qty 16

## 2012-08-05 MED ORDER — DEXAMETHASONE SODIUM PHOSPHATE 4 MG/ML IJ SOLN
20.0000 mg | Freq: Once | INTRAMUSCULAR | Status: AC
  Administered 2012-08-05: 20 mg via INTRAVENOUS

## 2012-08-05 MED ORDER — SODIUM CHLORIDE 0.9 % IV SOLN: Freq: Once | INTRAVENOUS | Status: DC

## 2012-08-05 MED ORDER — IRINOTECAN HCL CHEMO INJECTION 100 MG/5ML
180.0000 mg/m2 | Freq: Once | INTRAVENOUS | Status: AC
  Administered 2012-08-05: 364 mg via INTRAVENOUS
  Filled 2012-08-05: qty 18.2

## 2012-08-05 MED ORDER — ONDANSETRON 16 MG/50ML IVPB (CHCC)
16.0000 mg | Freq: Once | INTRAVENOUS | Status: AC
  Administered 2012-08-05: 16 mg via INTRAVENOUS

## 2012-08-05 MED ORDER — SODIUM CHLORIDE 0.9 % IV SOLN
450.0000 mg | Freq: Once | INTRAVENOUS | Status: AC
  Administered 2012-08-05: 450 mg via INTRAVENOUS
  Filled 2012-08-05: qty 18

## 2012-08-05 MED ORDER — SODIUM CHLORIDE 0.9 % IV SOLN
Freq: Once | INTRAVENOUS | Status: AC
  Administered 2012-08-05: 10:00:00 via INTRAVENOUS

## 2012-08-05 MED ORDER — ATROPINE SULFATE 1 MG/ML IJ SOLN: 0.5000 mg | Freq: Once | INTRAMUSCULAR | Status: DC | PRN

## 2012-08-05 MED ORDER — SODIUM CHLORIDE 0.9 % IV SOLN
2400.0000 mg/m2 | INTRAVENOUS | Status: DC
  Administered 2012-08-05: 4850 mg via INTRAVENOUS
  Filled 2012-08-05: qty 97

## 2012-08-05 MED ORDER — LEUCOVORIN CALCIUM INJECTION 350 MG
396.0000 mg/m2 | Freq: Once | INTRAVENOUS | Status: AC
  Administered 2012-08-05: 800 mg via INTRAVENOUS
  Filled 2012-08-05: qty 40

## 2012-08-05 NOTE — Progress Notes (Signed)
Hematology and Oncology Follow Up Visit  Christopher Jimenez [id number] [birth year] 61 y.o. 08/05/2012 7:57 PM  CC: Shirley Friar, MD  Ollen Gross. Vernell Morgans, M.D.  Billie Lade, Ph.D., M.D.    Principle Diagnosis: A 61 year old gentleman diagnosed with a T3 N1 rectosigmoid adenocarcinoma.  He had 1/14 lymph nodes involved diagnosed in December  2010. 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   Prior Therapy: 1. Underwent a lower anterior resection done in January 2012. 2. Underwent adjuvant radiation therapy with Xeloda concluded in March 2011. 3. Received adjuvant FOLFOX therapy concluded in July 2012. 4. The patient had involvement of his adrenal gland and underwent adrenalectomy on April 2012 under the care of Dr. Johna Sheriff. 5. He is status post a microwave ablation of the hepatic metastasis done July 06, 2011.  Current therapy: On systemic chemotherapy with FOLFIRI/Avastin started on 07/21/12. Here for cycle 2 today.  Interim History: [name] presents today for a followup visit.  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  Tolerated first cycle of chemo well. He had constipation initially then developed diarrhea after using stool softeners. Diarrhea has resolved today. He is not reporting any abdominal pain.  He has nausea and used his anti-emetics which were effective. His performance status activity level remains excellent.  He does report some occasional fatigue at exertion but had not report any shortness of breath.  Had not reported any cough, not associated with any hemoptysis or hematemesis.   Medications: I have reviewed the patient's current medications. No current outpatient prescriptions on file. No current facility-administered medications for this visit. Facility-Administered  Medications Ordered in Other Visits: [COMPLETED] 0.9 %  sodium chloride infusion, , Intravenous, Once, Benjiman Core, MD;  [COMPLETED] bevacizumab (AVASTIN) 450 mg in sodium chloride 0.9 % 100 mL chemo infusion, 450 mg, Intravenous, Once, Benjiman Core, MD, 450 mg at 08/05/12 1015;  [COMPLETED] dexamethasone (DECADRON) injection 20 mg, 20 mg, Intravenous, Once, Benjiman Core, MD, 20 mg at 08/05/12 0949 [COMPLETED] fluorouracil (ADRUCIL) chemo injection 800 mg, 400 mg/m2 (Treatment Plan Actual), Intravenous, Once, Benjiman Core, MD, 800 mg at 08/05/12 1212;  [COMPLETED] irinotecan (CAMPTOSAR) 364 mg in dextrose 5 % 500 mL chemo infusion, 180 mg/m2 (Treatment Plan Actual), Intravenous, Once, Benjiman Core, MD, 364 mg at 08/05/12 1031 [COMPLETED] leucovorin 800 mg in dextrose 5 % 250 mL infusion, 396 mg/m2 (Treatment Plan Actual), Intravenous, Once, Benjiman Core, MD, 800 mg at 08/05/12 1032;  [COMPLETED] ondansetron (ZOFRAN) IVPB 16 mg, 16 mg, Intravenous, Once, Benjiman Core, MD, 16 mg at 08/05/12 0947;  [DISCONTINUED] 0.9 %  sodium chloride infusion, , Intravenous, Once, Benjiman Core, MD [DISCONTINUED] atropine injection 0.5 mg, 0.5 mg, Intravenous, Once PRN, Benjiman Core, MD;  [DISCONTINUED] fluorouracil (ADRUCIL) 4,850 mg in sodium chloride 0.9 % 150 mL chemo infusion, 2,400 mg/m2 (Treatment Plan Actual), Intravenous, 1 day or 1 dose, Benjiman Core, MD, 4,850 mg at 08/05/12 1229  Allergies: No Known Allergies  Past Medical History, Surgical history, Social history, and Family History were reviewed and updated.  Review of Systems: Constitutional:  Negative for fever, chills, night sweats, anorexia, weight loss, pain. Cardiovascular: no chest pain or dyspnea on exertion Respiratory: no cough, shortness of breath, or wheezing Neurological: no TIA or stroke symptoms Dermatological: negative ENT:  negative Skin: Negative. Gastrointestinal: no abdominal pain, change in bowel habits, or black  or bloody stools Genito-Urinary: no dysuria, trouble voiding, or hematuria Hematological and Lymphatic: negative Breast: negative Musculoskeletal: negative Remaining ROS negative.  Physical Exam: Blood pressure 114/71, pulse 63, temperature 97.5 F (36.4 C), temperature source Oral, resp. rate 20, height 6' (1.829 m), weight 196 lb 6.4 oz (89.086 kg). ECOG: 0 General appearance: alert Head: Normocephalic, without obvious abnormality, atraumatic Neck: no adenopathy, no carotid bruit, no JVD, supple, symmetrical, trachea midline and thyroid not enlarged, symmetric, no tenderness/mass/nodules Lymph nodes: Cervical, supraclavicular, and axillary nodes normal. Heart:regular rate and rhythm, S1, S2 normal, no murmur, click, rub or gallop Lung:chest clear, no wheezing, rales, normal symmetric air entry Abdomen: soft, non-tender, without masses or organomegaly EXT:no erythema, induration, or nodules. Skin: a small skin lesion noted on left side of his forehead. No bleeding or irritation noted. It is raised, non-melanotic in nature.    Lab Results: Lab Results  Component Value Date   WBC 3.0* 08/04/2012   HGB 13.1 08/04/2012   HCT 37.8* 08/04/2012   MCV 86.3 08/04/2012   PLT 173 08/04/2012     Chemistry      Component Value Date/Time   NA 138 08/04/2012 1010   NA 142 02/19/2012 0910   NA 140 12/20/2011 1016   K 4.3 08/04/2012 1010   K 5.0* 02/19/2012 0910   K 4.5 12/20/2011 1016   CL 107 08/04/2012 1010   CL 99 02/19/2012 0910   CL 105 12/20/2011 1016   CO2 26 08/04/2012 1010   CO2 30 02/19/2012 0910   CO2 27 12/20/2011 1016   BUN 18.0 08/04/2012 1010   BUN 14 02/19/2012 0910   BUN 18 12/20/2011 1016   CREATININE 0.9 08/04/2012 1010   CREATININE 1.0 02/19/2012 0910   CREATININE 0.90 12/20/2011 1016      Component Value Date/Time   CALCIUM 9.4 08/04/2012 1010   CALCIUM 8.5 02/19/2012 0910   CALCIUM 9.0 12/20/2011 1016   ALKPHOS 93 08/04/2012 1010   ALKPHOS 53 02/19/2012 0910    ALKPHOS 59 12/20/2011 1016   AST 20 08/04/2012 1010   AST 21 02/19/2012 0910   AST 15 12/20/2011 1016   ALT 29 08/04/2012 1010   ALT 13 12/20/2011 1016   BILITOT 0.90 08/04/2012 1010   BILITOT 0.80 02/19/2012 0910   BILITOT 0.5 12/20/2011 1016      Impression and Plan: This is a pleasant 61 year old gentleman with the following issues: 1. Stage IV colorectal cancer.  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  He had a liver metastasis that has been ablated.  PET scan showed disease progression. He will proceed with cycle 2 of FOLFIRI/Avastin today. I plan to give him 2 months of therapy and repeat scans at this time.  2. Port-A-Cath management.  PAC in place for chemotherapy. 3. Follow-up. In mid-December, per his request, for cycle 3.     CURCIO, KRISTIN 11/12/20137:57 PM

## 2012-08-05 NOTE — Progress Notes (Signed)
Urine Protein Dipstick performed by this RN.   Trace Protein resulted.

## 2012-08-05 NOTE — Patient Instructions (Addendum)
[facility] Discharge Instructions for Patients Receiving Chemotherapy  Today you received the following chemotherapy agents ; Avastin, Leucovorin, Irinotecan, and 5-FU (Fluoracil).  To help prevent nausea and vomiting after your treatment, we encourage you to take your nausea medication as directed.     If you develop nausea and vomiting that is not controlled by your nausea medication, call the clinic. If it is after clinic hours your family physician or the after hours number for the clinic or go to the Emergency Department.   BELOW ARE SYMPTOMS THAT SHOULD BE REPORTED IMMEDIATELY:  *FEVER GREATER THAN 100.5 F  *CHILLS WITH OR WITHOUT FEVER  NAUSEA AND VOMITING THAT IS NOT CONTROLLED WITH YOUR NAUSEA MEDICATION  *UNUSUAL SHORTNESS OF BREATH  *UNUSUAL BRUISING OR BLEEDING  TENDERNESS IN MOUTH AND THROAT WITH OR WITHOUT PRESENCE OF ULCERS  *URINARY PROBLEMS  *BOWEL PROBLEMS  UNUSUAL RASH Items with * indicate a potential emergency and should be followed up as soon as possible.   Feel free to call the clinic you have any questions or concerns. The clinic phone number is [phone].   I have been informed and understand all the instructions given to me. I know to contact the clinic, my physician, or go to the Emergency Department if any problems should occur. I do not have any questions at this time, but understand that I may call the clinic during office hours   should I have any questions or need assistance in obtaining follow up care.    __________________________________________  _____________  __________ Signature of Patient or Authorized Representative            Date                   Time    __________________________________________ Nurse's Signature

## 2012-08-07 ENCOUNTER — Ambulatory Visit (HOSPITAL_BASED_OUTPATIENT_CLINIC_OR_DEPARTMENT_OTHER): Payer: BC Managed Care – PPO

## 2012-08-07 VITALS — BP 117/79 | HR 62 | Temp 98.9°F

## 2012-08-07 DIAGNOSIS — C189 Malignant neoplasm of colon, unspecified: Secondary | ICD-10-CM

## 2012-08-07 DIAGNOSIS — C19 Malignant neoplasm of rectosigmoid junction: Secondary | ICD-10-CM

## 2012-08-07 DIAGNOSIS — C787 Secondary malignant neoplasm of liver and intrahepatic bile duct: Secondary | ICD-10-CM

## 2012-08-07 DIAGNOSIS — Z452 Encounter for adjustment and management of vascular access device: Secondary | ICD-10-CM

## 2012-08-07 DIAGNOSIS — C7970 Secondary malignant neoplasm of unspecified adrenal gland: Secondary | ICD-10-CM

## 2012-08-07 MED ORDER — SODIUM CHLORIDE 0.9 % IJ SOLN
10.0000 mL | INTRAMUSCULAR | Status: DC | PRN
  Administered 2012-08-07: 10 mL
  Filled 2012-08-07: qty 10

## 2012-08-07 MED ORDER — HEPARIN SOD (PORK) LOCK FLUSH 100 UNIT/ML IV SOLN
500.0000 [IU] | Freq: Once | INTRAVENOUS | Status: AC | PRN
  Administered 2012-08-07: 500 [IU]
  Filled 2012-08-07: qty 5

## 2012-08-08 ENCOUNTER — Telehealth: Payer: Self-pay | Admitting: Oncology

## 2012-08-08 NOTE — Telephone Encounter (Signed)
no answer...mailed Dec 2013 appt schedule to pt.Marland KitchenMarland Kitchen

## 2012-08-19 ENCOUNTER — Other Ambulatory Visit: Payer: BC Managed Care – PPO | Admitting: Lab

## 2012-08-19 ENCOUNTER — Ambulatory Visit: Payer: BC Managed Care – PPO | Admitting: Oncology

## 2012-09-02 ENCOUNTER — Other Ambulatory Visit: Payer: Self-pay | Admitting: Oncology

## 2012-09-02 ENCOUNTER — Ambulatory Visit: Payer: BC Managed Care – PPO | Admitting: Oncology

## 2012-09-02 ENCOUNTER — Other Ambulatory Visit (HOSPITAL_BASED_OUTPATIENT_CLINIC_OR_DEPARTMENT_OTHER): Payer: BC Managed Care – PPO | Admitting: Lab

## 2012-09-02 ENCOUNTER — Ambulatory Visit (HOSPITAL_BASED_OUTPATIENT_CLINIC_OR_DEPARTMENT_OTHER): Payer: BC Managed Care – PPO

## 2012-09-02 ENCOUNTER — Other Ambulatory Visit: Payer: BC Managed Care – PPO | Admitting: Lab

## 2012-09-02 VITALS — BP 123/79 | HR 66 | Temp 97.5°F

## 2012-09-02 DIAGNOSIS — Z5111 Encounter for antineoplastic chemotherapy: Secondary | ICD-10-CM

## 2012-09-02 DIAGNOSIS — C19 Malignant neoplasm of rectosigmoid junction: Secondary | ICD-10-CM

## 2012-09-02 DIAGNOSIS — C189 Malignant neoplasm of colon, unspecified: Secondary | ICD-10-CM

## 2012-09-02 DIAGNOSIS — C787 Secondary malignant neoplasm of liver and intrahepatic bile duct: Secondary | ICD-10-CM

## 2012-09-02 DIAGNOSIS — C7970 Secondary malignant neoplasm of unspecified adrenal gland: Secondary | ICD-10-CM

## 2012-09-02 DIAGNOSIS — Z5112 Encounter for antineoplastic immunotherapy: Secondary | ICD-10-CM

## 2012-09-02 LAB — CBC WITH DIFFERENTIAL/PLATELET
BASO%: 0.5 % (ref 0.0–2.0)
HCT: 39.7 % (ref 38.4–49.9)
MCHC: 34.8 g/dL (ref 32.0–36.0)
MONO#: 1.3 10*3/uL — ABNORMAL HIGH (ref 0.1–0.9)
RBC: 4.56 10*6/uL (ref 4.20–5.82)
WBC: 8.8 10*3/uL (ref 4.0–10.3)
lymph#: 1.5 10*3/uL (ref 0.9–3.3)
nRBC: 0 % (ref 0–0)

## 2012-09-02 LAB — COMPREHENSIVE METABOLIC PANEL (CC13)
ALT: 39 U/L (ref 0–55)
AST: 33 U/L (ref 5–34)
Albumin: 4.2 g/dL (ref 3.5–5.0)
Calcium: 9.6 mg/dL (ref 8.4–10.4)
Chloride: 104 mEq/L (ref 98–107)
Potassium: 4.3 mEq/L (ref 3.5–5.1)
Sodium: 138 mEq/L (ref 136–145)

## 2012-09-02 MED ORDER — ONDANSETRON 16 MG/50ML IVPB (CHCC)
16.0000 mg | Freq: Once | INTRAVENOUS | Status: AC
  Administered 2012-09-02: 16 mg via INTRAVENOUS

## 2012-09-02 MED ORDER — DEXAMETHASONE SODIUM PHOSPHATE 4 MG/ML IJ SOLN
20.0000 mg | Freq: Once | INTRAMUSCULAR | Status: AC
  Administered 2012-09-02: 20 mg via INTRAVENOUS

## 2012-09-02 MED ORDER — SODIUM CHLORIDE 0.9 % IV SOLN
Freq: Once | INTRAVENOUS | Status: AC
  Administered 2012-09-02: 13:00:00 via INTRAVENOUS

## 2012-09-02 MED ORDER — LEUCOVORIN CALCIUM INJECTION 350 MG
396.0000 mg/m2 | Freq: Once | INTRAVENOUS | Status: AC
  Administered 2012-09-02: 800 mg via INTRAVENOUS
  Filled 2012-09-02: qty 40

## 2012-09-02 MED ORDER — SODIUM CHLORIDE 0.9 % IV SOLN
2400.0000 mg/m2 | INTRAVENOUS | Status: DC
  Administered 2012-09-02: 4850 mg via INTRAVENOUS
  Filled 2012-09-02: qty 97

## 2012-09-02 MED ORDER — IRINOTECAN HCL CHEMO INJECTION 100 MG/5ML
178.0000 mg/m2 | Freq: Once | INTRAVENOUS | Status: AC
  Administered 2012-09-02: 360 mg via INTRAVENOUS
  Filled 2012-09-02: qty 18

## 2012-09-02 MED ORDER — FLUOROURACIL CHEMO INJECTION 2.5 GM/50ML
400.0000 mg/m2 | Freq: Once | INTRAVENOUS | Status: AC
  Administered 2012-09-02: 800 mg via INTRAVENOUS
  Filled 2012-09-02: qty 16

## 2012-09-02 MED ORDER — SODIUM CHLORIDE 0.9 % IV SOLN
5.0000 mg/kg | Freq: Once | INTRAVENOUS | Status: AC
  Administered 2012-09-02: 450 mg via INTRAVENOUS
  Filled 2012-09-02: qty 18

## 2012-09-02 MED ORDER — SODIUM CHLORIDE 0.9 % IV SOLN: Freq: Once | INTRAVENOUS | Status: DC

## 2012-09-02 NOTE — Patient Instructions (Addendum)
[facility] Discharge Instructions for Patients Receiving Chemotherapy  Today you received the following chemotherapy agents : 5FU, Leucovorin, Camptosar, Avastin  To help prevent nausea and vomiting after your treatment, we encourage you to take your nausea medication as directed by your MD.  If you develop nausea and vomiting that is not controlled by your nausea medication, call the clinic. If it is after clinic hours your family physician or the after hours number for the clinic or go to the Emergency Department.   BELOW ARE SYMPTOMS THAT SHOULD BE REPORTED IMMEDIATELY:  *FEVER GREATER THAN 100.5 F  *CHILLS WITH OR WITHOUT FEVER  NAUSEA AND VOMITING THAT IS NOT CONTROLLED WITH YOUR NAUSEA MEDICATION  *UNUSUAL SHORTNESS OF BREATH  *UNUSUAL BRUISING OR BLEEDING  TENDERNESS IN MOUTH AND THROAT WITH OR WITHOUT PRESENCE OF ULCERS  *URINARY PROBLEMS  *BOWEL PROBLEMS  UNUSUAL RASH Items with * indicate a potential emergency and should be followed up as soon as possible.  Feel free to call the clinic you have any questions or concerns. The clinic phone number is [phone].

## 2012-09-04 ENCOUNTER — Ambulatory Visit (HOSPITAL_BASED_OUTPATIENT_CLINIC_OR_DEPARTMENT_OTHER): Payer: BC Managed Care – PPO

## 2012-09-04 VITALS — BP 130/64 | HR 69 | Temp 97.5°F

## 2012-09-04 DIAGNOSIS — C787 Secondary malignant neoplasm of liver and intrahepatic bile duct: Secondary | ICD-10-CM

## 2012-09-04 DIAGNOSIS — C189 Malignant neoplasm of colon, unspecified: Secondary | ICD-10-CM

## 2012-09-04 DIAGNOSIS — C19 Malignant neoplasm of rectosigmoid junction: Secondary | ICD-10-CM

## 2012-09-04 DIAGNOSIS — C7970 Secondary malignant neoplasm of unspecified adrenal gland: Secondary | ICD-10-CM

## 2012-09-04 MED ORDER — HEPARIN SOD (PORK) LOCK FLUSH 100 UNIT/ML IV SOLN
500.0000 [IU] | Freq: Once | INTRAVENOUS | Status: AC | PRN
  Administered 2012-09-04: 500 [IU]
  Filled 2012-09-04: qty 5

## 2012-09-04 MED ORDER — SODIUM CHLORIDE 0.9 % IJ SOLN
10.0000 mL | INTRAMUSCULAR | Status: DC | PRN
  Administered 2012-09-04: 10 mL
  Filled 2012-09-04: qty 10

## 2012-09-04 NOTE — Patient Instructions (Signed)
Call MD for problems 

## 2012-09-23 ENCOUNTER — Encounter: Payer: Self-pay | Admitting: Oncology

## 2012-09-23 ENCOUNTER — Telehealth: Payer: Self-pay | Admitting: Oncology

## 2012-09-23 ENCOUNTER — Other Ambulatory Visit (HOSPITAL_BASED_OUTPATIENT_CLINIC_OR_DEPARTMENT_OTHER): Payer: BC Managed Care – PPO | Admitting: Lab

## 2012-09-23 ENCOUNTER — Ambulatory Visit (HOSPITAL_BASED_OUTPATIENT_CLINIC_OR_DEPARTMENT_OTHER): Payer: BC Managed Care – PPO | Admitting: Oncology

## 2012-09-23 ENCOUNTER — Ambulatory Visit: Payer: BC Managed Care – PPO

## 2012-09-23 VITALS — BP 119/78 | HR 70 | Temp 97.4°F | Resp 18 | Ht 72.0 in | Wt 199.1 lb

## 2012-09-23 DIAGNOSIS — C19 Malignant neoplasm of rectosigmoid junction: Secondary | ICD-10-CM

## 2012-09-23 DIAGNOSIS — C787 Secondary malignant neoplasm of liver and intrahepatic bile duct: Secondary | ICD-10-CM

## 2012-09-23 DIAGNOSIS — D702 Other drug-induced agranulocytosis: Secondary | ICD-10-CM

## 2012-09-23 DIAGNOSIS — C7970 Secondary malignant neoplasm of unspecified adrenal gland: Secondary | ICD-10-CM

## 2012-09-23 DIAGNOSIS — D709 Neutropenia, unspecified: Secondary | ICD-10-CM

## 2012-09-23 DIAGNOSIS — C189 Malignant neoplasm of colon, unspecified: Secondary | ICD-10-CM

## 2012-09-23 LAB — CBC WITH DIFFERENTIAL/PLATELET
Basophils Absolute: 0 10*3/uL (ref 0.0–0.1)
EOS%: 10.9 % — ABNORMAL HIGH (ref 0.0–7.0)
Eosinophils Absolute: 0.3 10*3/uL (ref 0.0–0.5)
HCT: 38.3 % — ABNORMAL LOW (ref 38.4–49.9)
HGB: 13.1 g/dL (ref 13.0–17.1)
LYMPH%: 35.6 % (ref 14.0–49.0)
MCH: 30.2 pg (ref 27.2–33.4)
MCV: 88.2 fL (ref 79.3–98.0)
MONO%: 23.9 % — ABNORMAL HIGH (ref 0.0–14.0)
NEUT#: 0.8 10*3/uL — ABNORMAL LOW (ref 1.5–6.5)
NEUT%: 28.9 % — ABNORMAL LOW (ref 39.0–75.0)
Platelets: 244 10*3/uL (ref 140–400)
RDW: 14.1 % (ref 11.0–14.6)

## 2012-09-23 NOTE — Progress Notes (Signed)
Hematology and Oncology Follow Up Visit  Christopher Jimenez [id number] [birth year] 61 y.o. 09/23/2012 1:07 PM  CC: Shirley Friar, MD  Ollen Gross. Vernell Morgans, M.D.  Billie Lade, Ph.D., M.D.    Principle Diagnosis: A 61 year old gentleman diagnosed with a T3 N1 rectosigmoid adenocarcinoma.  He had 1/14 lymph nodes involved diagnosed in December  2010. 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   Prior Therapy: 1. Underwent a lower anterior resection done in January 2012. 2. Underwent adjuvant radiation therapy with Xeloda concluded in March 2011. 3. Received adjuvant FOLFOX therapy concluded in July 2012. 4. The patient had involvement of his adrenal gland and underwent adrenalectomy on April 2012 under the care of Dr. Johna Sheriff. 5. He is status post a microwave ablation of the hepatic metastasis done July 06, 2011.  Current therapy: On systemic chemotherapy with FOLFIRI/Avastin started on 07/21/12. Here for cycle 4 today.  Interim History: Christopher Jimenez presents today for a followup visit.  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  Tolerating chemo well. He had constipation initially then developed diarrhea after using stool softeners. Diarrhea has resolved today. He is not reporting any abdominal pain.  He has nausea and used his anti-emetics which were effective. His performance status activity level remains excellent.  He does report some occasional fatigue at exertion but had not report any shortness of breath.  Had not reported any cough, not associated with any hemoptysis or hematemesis. No fevers or chills.  Medications: I have reviewed the patient's current medications. No current outpatient prescriptions on file.  Allergies: No Known Allergies  Past Medical History, Surgical history, Social  history, and Family History were reviewed and updated.  Review of Systems: Constitutional:  Negative for fever, chills, night sweats, anorexia, weight loss, pain. Cardiovascular: no chest pain or dyspnea on exertion Respiratory: no cough, shortness of breath, or wheezing Neurological: no TIA or stroke symptoms Dermatological: negative ENT: negative Skin: Negative. Gastrointestinal: no abdominal pain, change in bowel habits, or black or bloody stools Genito-Urinary: no dysuria, trouble voiding, or hematuria Hematological and Lymphatic: negative Breast: negative Musculoskeletal: negative Remaining ROS negative.  Physical Exam: Blood pressure 119/78, pulse 70, temperature 97.4 F (36.3 C), temperature source Oral, resp. rate 18, height 6' (1.829 m), weight 199 lb 1.6 oz (90.311 kg). ECOG: 0 General appearance: alert Head: Normocephalic, without obvious abnormality, atraumatic Neck: no adenopathy, no carotid bruit, no JVD, supple, symmetrical, trachea midline and thyroid not enlarged, symmetric, no tenderness/mass/nodules Lymph nodes: Cervical, supraclavicular, and axillary nodes normal. Heart:regular rate and rhythm, S1, S2 normal, no murmur, click, rub or gallop Lung:chest clear, no wheezing, rales, normal symmetric air entry Abdomen: soft, non-tender, without masses or organomegaly EXT:no erythema, induration, or nodules. Skin: a small skin lesion noted on left side of his forehead. No bleeding or irritation noted. It is raised, non-melanotic in nature.    Lab Results: Lab Results  Component Value Date   WBC 2.8* 09/23/2012   HGB 13.1 09/23/2012   HCT 38.3* 09/23/2012   MCV 88.2 09/23/2012   PLT 244 09/23/2012     Chemistry      Component Value Date/Time   NA 138 09/02/2012 1129   NA 142 02/19/2012 0910   NA 140 12/20/2011 1016   K 4.3 09/02/2012 1129   K 5.0* 02/19/2012 1191  K 4.5 12/20/2011 1016   CL 104 09/02/2012 1129   CL 99 02/19/2012 0910   CL 105 12/20/2011 1016    CO2 26 09/02/2012 1129   CO2 30 02/19/2012 0910   CO2 27 12/20/2011 1016   BUN 15.0 09/02/2012 1129   BUN 14 02/19/2012 0910   BUN 18 12/20/2011 1016   CREATININE 0.9 09/02/2012 1129   CREATININE 1.0 02/19/2012 0910   CREATININE 0.90 12/20/2011 1016      Component Value Date/Time   CALCIUM 9.6 09/02/2012 1129   CALCIUM 8.5 02/19/2012 0910   CALCIUM 9.0 12/20/2011 1016   ALKPHOS 82 09/02/2012 1129   ALKPHOS 53 02/19/2012 0910   ALKPHOS 59 12/20/2011 1016   AST 33 09/02/2012 1129   AST 21 02/19/2012 0910   AST 15 12/20/2011 1016   ALT 39 09/02/2012 1129   ALT 13 12/20/2011 1016   BILITOT 1.12 09/02/2012 1129   BILITOT 0.80 02/19/2012 0910   BILITOT 0.5 12/20/2011 1016      Impression and Plan: This is a pleasant 61 year old gentleman with the following issues: 1. Stage IV colorectal cancer.  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  He had a liver metastasis that has been ablated.  PET scan showed disease progression. ANC is 0.8. Will hold FOLFIRI/Avastin today. Will plan to obtain restaging CT scans within the next two weeks. 2. Neutropenia. Due to chemo. Will hold FOLFIRI/Avastin today.  3. Port-A-Cath management.  PAC in place for chemotherapy. 4. Follow-up. In 2 weeks after CT scan.    Clenton Pare 12/31/20131:07 PM

## 2012-09-23 NOTE — Telephone Encounter (Signed)
appts made and printed to pt pt aware that cen sch will call with scan appt and mw will add tx      anne

## 2012-09-30 ENCOUNTER — Other Ambulatory Visit: Payer: Self-pay | Admitting: Oncology

## 2012-10-07 ENCOUNTER — Encounter (HOSPITAL_COMMUNITY): Payer: Self-pay

## 2012-10-07 ENCOUNTER — Ambulatory Visit (HOSPITAL_COMMUNITY)
Admission: RE | Admit: 2012-10-07 | Discharge: 2012-10-07 | Disposition: A | Discharge status: Home / Self Care | Payer: BC Managed Care – PPO | Source: Ambulatory Visit | Attending: Oncology | Admitting: Oncology

## 2012-10-07 ENCOUNTER — Other Ambulatory Visit: Payer: Self-pay | Admitting: Oncology

## 2012-10-07 DIAGNOSIS — R599 Enlarged lymph nodes, unspecified: Secondary | ICD-10-CM | POA: Insufficient documentation

## 2012-10-07 DIAGNOSIS — R911 Solitary pulmonary nodule: Secondary | ICD-10-CM | POA: Insufficient documentation

## 2012-10-07 DIAGNOSIS — I708 Atherosclerosis of other arteries: Secondary | ICD-10-CM | POA: Insufficient documentation

## 2012-10-07 DIAGNOSIS — C787 Secondary malignant neoplasm of liver and intrahepatic bile duct: Secondary | ICD-10-CM | POA: Insufficient documentation

## 2012-10-07 DIAGNOSIS — K5730 Diverticulosis of large intestine without perforation or abscess without bleeding: Secondary | ICD-10-CM | POA: Insufficient documentation

## 2012-10-07 DIAGNOSIS — Q619 Cystic kidney disease, unspecified: Secondary | ICD-10-CM | POA: Insufficient documentation

## 2012-10-07 DIAGNOSIS — C189 Malignant neoplasm of colon, unspecified: Secondary | ICD-10-CM

## 2012-10-07 DIAGNOSIS — K7689 Other specified diseases of liver: Secondary | ICD-10-CM | POA: Insufficient documentation

## 2012-10-07 DIAGNOSIS — K838 Other specified diseases of biliary tract: Secondary | ICD-10-CM | POA: Insufficient documentation

## 2012-10-07 MED ORDER — IOHEXOL 300 MG/ML  SOLN: 125.0000 mL | Freq: Once | INTRAMUSCULAR | Status: DC | PRN

## 2012-10-07 MED ORDER — IOHEXOL 300 MG/ML  SOLN
125.0000 mL | Freq: Once | INTRAMUSCULAR | Status: AC | PRN
  Administered 2012-10-07: 125 mL via INTRAVENOUS

## 2012-10-08 ENCOUNTER — Ambulatory Visit (HOSPITAL_BASED_OUTPATIENT_CLINIC_OR_DEPARTMENT_OTHER): Payer: BC Managed Care – PPO | Admitting: Oncology

## 2012-10-08 ENCOUNTER — Telehealth: Payer: Self-pay | Admitting: *Deleted

## 2012-10-08 ENCOUNTER — Ambulatory Visit (HOSPITAL_BASED_OUTPATIENT_CLINIC_OR_DEPARTMENT_OTHER): Payer: BC Managed Care – PPO

## 2012-10-08 ENCOUNTER — Other Ambulatory Visit: Payer: Self-pay | Admitting: Interventional Radiology

## 2012-10-08 ENCOUNTER — Other Ambulatory Visit (HOSPITAL_BASED_OUTPATIENT_CLINIC_OR_DEPARTMENT_OTHER): Payer: BC Managed Care – PPO | Admitting: Lab

## 2012-10-08 ENCOUNTER — Telehealth: Payer: Self-pay | Admitting: Oncology

## 2012-10-08 VITALS — BP 147/90 | HR 75 | Temp 97.2°F | Resp 20 | Ht 72.0 in | Wt 197.3 lb

## 2012-10-08 VITALS — BP 122/77 | HR 61

## 2012-10-08 DIAGNOSIS — C787 Secondary malignant neoplasm of liver and intrahepatic bile duct: Secondary | ICD-10-CM

## 2012-10-08 DIAGNOSIS — Z5111 Encounter for antineoplastic chemotherapy: Secondary | ICD-10-CM

## 2012-10-08 DIAGNOSIS — C189 Malignant neoplasm of colon, unspecified: Secondary | ICD-10-CM

## 2012-10-08 DIAGNOSIS — Z5112 Encounter for antineoplastic immunotherapy: Secondary | ICD-10-CM

## 2012-10-08 DIAGNOSIS — C7970 Secondary malignant neoplasm of unspecified adrenal gland: Secondary | ICD-10-CM

## 2012-10-08 DIAGNOSIS — C19 Malignant neoplasm of rectosigmoid junction: Secondary | ICD-10-CM

## 2012-10-08 LAB — COMPREHENSIVE METABOLIC PANEL (CC13)
ALT: 22 U/L (ref 0–55)
AST: 24 U/L (ref 5–34)
Calcium: 9.4 mg/dL (ref 8.4–10.4)
Chloride: 104 mEq/L (ref 98–107)
Creatinine: 0.8 mg/dL (ref 0.7–1.3)
Potassium: 4 mEq/L (ref 3.5–5.1)
Sodium: 139 mEq/L (ref 136–145)
Total Protein: 7.1 g/dL (ref 6.4–8.3)

## 2012-10-08 LAB — CBC WITH DIFFERENTIAL/PLATELET
BASO%: 0.5 % (ref 0.0–2.0)
Eosinophils Absolute: 0.1 10*3/uL (ref 0.0–0.5)
HCT: 39.2 % (ref 38.4–49.9)
HGB: 13.7 g/dL (ref 13.0–17.1)
LYMPH%: 17.5 % (ref 14.0–49.0)
MCH: 30.9 pg (ref 27.2–33.4)
MCV: 88.3 fL (ref 79.3–98.0)
MONO#: 0.6 10*3/uL (ref 0.1–0.9)
Platelets: 190 10*3/uL (ref 140–400)
lymph#: 1.1 10*3/uL (ref 0.9–3.3)
nRBC: 0 % (ref 0–0)

## 2012-10-08 MED ORDER — DEXAMETHASONE SODIUM PHOSPHATE 4 MG/ML IJ SOLN
20.0000 mg | Freq: Once | INTRAMUSCULAR | Status: AC
  Administered 2012-10-08: 20 mg via INTRAVENOUS

## 2012-10-08 MED ORDER — SODIUM CHLORIDE 0.9 % IV SOLN
5.0000 mg/kg | Freq: Once | INTRAVENOUS | Status: AC
  Administered 2012-10-08: 450 mg via INTRAVENOUS
  Filled 2012-10-08: qty 18

## 2012-10-08 MED ORDER — ONDANSETRON 16 MG/50ML IVPB (CHCC)
16.0000 mg | Freq: Once | INTRAVENOUS | Status: AC
  Administered 2012-10-08: 16 mg via INTRAVENOUS

## 2012-10-08 MED ORDER — SODIUM CHLORIDE 0.9 % IV SOLN
Freq: Once | INTRAVENOUS | Status: AC
  Administered 2012-10-08: 09:00:00 via INTRAVENOUS

## 2012-10-08 MED ORDER — SODIUM CHLORIDE 0.9 % IV SOLN
2400.0000 mg/m2 | INTRAVENOUS | Status: DC
  Administered 2012-10-08: 4850 mg via INTRAVENOUS
  Filled 2012-10-08: qty 97

## 2012-10-08 MED ORDER — LEUCOVORIN CALCIUM INJECTION 350 MG
396.0000 mg/m2 | Freq: Once | INTRAVENOUS | Status: AC
  Administered 2012-10-08: 800 mg via INTRAVENOUS
  Filled 2012-10-08: qty 40

## 2012-10-08 MED ORDER — IRINOTECAN HCL CHEMO INJECTION 100 MG/5ML
178.0000 mg/m2 | Freq: Once | INTRAVENOUS | Status: AC
  Administered 2012-10-08: 360 mg via INTRAVENOUS
  Filled 2012-10-08: qty 18

## 2012-10-08 MED ORDER — ATROPINE SULFATE 0.4 MG/ML IJ SOLN
0.5000 mg | Freq: Once | INTRAMUSCULAR | Status: DC | PRN
  Filled 2012-10-08: qty 1.25

## 2012-10-08 MED ORDER — FLUOROURACIL CHEMO INJECTION 2.5 GM/50ML
400.0000 mg/m2 | Freq: Once | INTRAVENOUS | Status: AC
  Administered 2012-10-08: 800 mg via INTRAVENOUS
  Filled 2012-10-08: qty 16

## 2012-10-08 NOTE — Patient Instructions (Signed)
 [facility] Center Discharge Instructions for Patients Receiving Chemotherapy  Today you received the following chemotherapy agents Avastin/Leucovorin/Irinotecan/5 FU To help prevent nausea and vomiting after your treatment, we encourage you to take your nausea medication as prescribed.  If you develop nausea and vomiting that is not controlled by your nausea medication, call the clinic. If it is after clinic hours your family physician or the after hours number for the clinic or go to the Emergency Department.   BELOW ARE SYMPTOMS THAT SHOULD BE REPORTED IMMEDIATELY:  *FEVER GREATER THAN 100.5 F  *CHILLS WITH OR WITHOUT FEVER  NAUSEA AND VOMITING THAT IS NOT CONTROLLED WITH YOUR NAUSEA MEDICATION  *UNUSUAL SHORTNESS OF BREATH  *UNUSUAL BRUISING OR BLEEDING  TENDERNESS IN MOUTH AND THROAT WITH OR WITHOUT PRESENCE OF ULCERS  *URINARY PROBLEMS  *BOWEL PROBLEMS  UNUSUAL RASH Items with * indicate a potential emergency and should be followed up as soon as possible.  One of the nurses will contact you 24 hours after your treatment. Please let the nurse know about any problems that you may have experienced. Feel free to call the clinic you have any questions or concerns. The clinic phone number is [phone].   I have been informed and understand all the instructions given to me. I know to contact the clinic, my physician, or go to the Emergency Department if any problems should occur. I do not have any questions at this time, but understand that I may call the clinic during office hours   should I have any questions or need assistance in obtaining follow up care.    __________________________________________  _____________  __________ Signature of Patient or Authorized Representative            Date                   Time    __________________________________________ Nurse's Signature

## 2012-10-08 NOTE — Telephone Encounter (Signed)
gv and printed appt schedule for pt....emailed michelle to add tx..the patient aware

## 2012-10-08 NOTE — Telephone Encounter (Signed)
Per staff message and POF I have scheduled appts.  JMW  

## 2012-10-08 NOTE — Progress Notes (Signed)
Hematology and Oncology Follow Up Visit  Christopher Jimenez [id number] Mar 23, 1951 62 y.o. 10/08/2012 8:47 AM  CC: Shirley Friar, MD  Ollen Gross. Vernell Morgans, M.D.  Billie Lade, Ph.D., M.D.    Principle Diagnosis: A 62 year old gentleman diagnosed with a T3 N1 rectosigmoid adenocarcinoma.  He had 1/14 lymph nodes involved diagnosed in December  2010. 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   Prior Therapy: 1. Underwent a lower anterior resection done in January 2012. 2. Underwent adjuvant radiation therapy with Xeloda concluded in March 2011. 3. Received adjuvant FOLFOX therapy concluded in July 2012. 4. The patient had involvement of his adrenal gland and underwent adrenalectomy on April 2012 under the care of Dr. Johna Sheriff. 5. He is status post a microwave ablation of the hepatic metastasis done July 06, 2011.  Current therapy: On systemic chemotherapy with FOLFIRI/Avastin started on 07/21/12. Here for cycle 4 today.  Interim History: Christopher Jimenez presents today for a followup visit.  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  Tolerating chemo well. He had constipation initially then developed diarrhea after using stool softeners. Diarrhea has resolved today. He is not reporting any abdominal pain.  He has nausea and used his anti-emetics which were effective. His performance status activity level remains excellent.  He does report some occasional fatigue at exertion but had not report any shortness of breath.  Had not reported any cough, not associated with any hemoptysis or hematemesis. No fevers or chills. No new symptoms reported  Medications: I have reviewed the patient's current medications. No current outpatient prescriptions on file.  Allergies: No Known Allergies  Past Medical History,  Surgical history, Social history, and Family History were reviewed and updated.  Review of Systems: Constitutional:  Negative for fever, chills, night sweats, anorexia, weight loss, pain. Cardiovascular: no chest pain or dyspnea on exertion Respiratory: no cough, shortness of breath, or wheezing Neurological: no TIA or stroke symptoms Dermatological: negative ENT: negative Skin: Negative. Gastrointestinal: no abdominal pain, change in bowel habits, or black or bloody stools Genito-Urinary: no dysuria, trouble voiding, or hematuria Hematological and Lymphatic: negative Breast: negative Musculoskeletal: negative Remaining ROS negative.  Physical Exam: Blood pressure 147/90, pulse 75, temperature 97.2 F (36.2 C), temperature source Oral, resp. rate 20, height 6' (1.829 m), weight 197 lb 4.8 oz (89.495 kg). ECOG: 0 General appearance: alert Head: Normocephalic, without obvious abnormality, atraumatic Neck: no adenopathy, no carotid bruit, no JVD, supple, symmetrical, trachea midline and thyroid not enlarged, symmetric, no tenderness/mass/nodules Lymph nodes: Cervical, supraclavicular, and axillary nodes normal. Heart:regular rate and rhythm, S1, S2 normal, no murmur, click, rub or gallop Lung:chest clear, no wheezing, rales, normal symmetric air entry Abdomen: soft, non-tender, without masses or organomegaly EXT:no erythema, induration, or nodules. Skin: a small skin lesion noted on left side of his forehead. No bleeding or irritation noted. It is raised, non-melanotic in nature.    Lab Results: Lab Results  Component Value Date   WBC 6.2 10/08/2012   HGB 13.7 10/08/2012   HCT 39.2 10/08/2012   MCV 88.3 10/08/2012   PLT 190 10/08/2012     Chemistry      Component Value Date/Time   NA 138 09/02/2012 1129   NA 142 02/19/2012 0910   NA 140 12/20/2011 1016   K 4.3 09/02/2012 1129   K  5.0* 02/19/2012 0910   K 4.5 12/20/2011 1016   CL 104 09/02/2012 1129   CL 99 02/19/2012 0910   CL  105 12/20/2011 1016   CO2 26 09/02/2012 1129   CO2 30 02/19/2012 0910   CO2 27 12/20/2011 1016   BUN 15.0 09/02/2012 1129   BUN 14 02/19/2012 0910   BUN 18 12/20/2011 1016   CREATININE 0.9 09/02/2012 1129   CREATININE 1.0 02/19/2012 0910   CREATININE 0.90 12/20/2011 1016      Component Value Date/Time   CALCIUM 9.6 09/02/2012 1129   CALCIUM 8.5 02/19/2012 0910   CALCIUM 9.0 12/20/2011 1016   ALKPHOS 82 09/02/2012 1129   ALKPHOS 53 02/19/2012 0910   ALKPHOS 59 12/20/2011 1016   AST 33 09/02/2012 1129   AST 21 02/19/2012 0910   AST 15 12/20/2011 1016   ALT 39 09/02/2012 1129   ALT 13 12/20/2011 1016   BILITOT 1.12 09/02/2012 1129   BILITOT 0.80 02/19/2012 0910   BILITOT 0.5 12/20/2011 1016     CT CHEST, ABDOMEN AND PELVIS WITH CONTRAST  Technique: Multidetector CT imaging of the chest, abdomen and  pelvis was performed following the standard protocol during bolus  administration of intravenous contrast.  Contrast: OMNIPAQUE IOHEXOL 300 MG/ML SOLN  Comparison: 05/21/2012  CT CHEST  Findings: Mildly heterogeneous right lower paratracheal lymph node  short axis 1.9 cm (formerly 2.2 cm). Right hilar node short axis  0.7 cm.  Trace pericardial fluid noted anteriorly.  An apical nodule on the right (image 19, series 6) has increased in  size, currently 5 mm in diameter but only 2-3 mm in diameter on the  prior exam from 02/19/2012. An irregular nodule in the right upper  lobe on image 38 of series 6 and image 56 of series 605 has  increased in size, currently approximately ten millimeters and  previously approximately 8 mm.  Stable 2 mm right middle lobe nodule, image 61 of series 6. Stable  subpleural lymph node along the right minor fissure.  Stable densely calcified granuloma adjacent to the posterior aortic  arch, with dense calcified left hilar and bilateral mediastinal  calcified lymph nodes.  Stable indistinct lingular nodules noted on images 60-62 of series  6, with the  subpleural nodule measuring up to 0.6 x 0.4 cm.  Scattered additional small nodules are present in the lingula and  left lower lobe and appears stable.  IMPRESSION:  1. Abnormally enlarged right paratracheal lymph node is slightly  decreased in size, but still measures 1.9 cm in short axis.  2. Scattered small pulmonary nodules are primarily stable,  although two nodules in the right upper lobe appears larger,  concerning for possible metastatic lesions.  CT ABDOMEN AND PELVIS  Findings: Conventional hepatic arterial anatomy noted. Single  bilateral renal arteries appear patent.  The ablation lesion in segment 6 of the liver measures 3.3 x 1.8 cm  (formerly 3.5 x 1.6 cm on prior exam). Posterior high density,  potentially from calcification given the appearance on prior PET  CT. This is similar to prior.  Mild adjacent to intrahepatic biliary dilatation distally in  segment six. No new hepatic lesion identified.  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 Pancreas, adrenal glands, and gallbladder  unremarkable. No pathologic retroperitoneal or porta hepatis  adenopathy is identified.  Left colostomy noted, with stranding probably from scarring along  the subcutaneous tissues of the colostomy site. Scattered colonic  diverticula noted. Orally administered contrast  extends through to  the colostomy.  Appendix unremarkable. Terminal ileum unremarkable. Stable  presacral density, likely primarily postoperative, without new mass-  like appearance in this vicinity. Urinary bladder unremarkable.  Bridging spurring of the sacroiliac joints noted. No mesenteric  mass observed. Lower lumbar facet arthropathy is present.  Bilateral renal cyst noted. Kidneys otherwise unremarkable.  Mild aortoiliac atherosclerotic calcification.  IMPRESSION:  1. Similar appearance of ablation lesion in segment 6 of the  liver. This lesion was hypermetabolic on the PET CT  from  16/06/9603. There is now mild intrahepatic biliary dilatation  distal to the lesion in segment 6 of the liver.   Impression and Plan: This is a pleasant 62 year old gentleman with the following issues: 1. Stage IV colorectal cancer.  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  He had a liver metastasis that has been ablated.  His CT scan discussed today and showed stable disease. The plan is to proceed with Cycle 4 today and 5 in two weeks. He is tolerating chemotherapy well.  2. Neutropenia. Due to chemo. Resolved.  3. Port-A-Cath management.  PAC in place for chemotherapy. 4. Follow-up. In 2 weeks.     Christopher Jimenez 1/15/20148:47 AM

## 2012-10-10 ENCOUNTER — Ambulatory Visit (HOSPITAL_BASED_OUTPATIENT_CLINIC_OR_DEPARTMENT_OTHER): Payer: BC Managed Care – PPO

## 2012-10-10 VITALS — BP 116/70 | HR 80 | Temp 97.0°F

## 2012-10-10 DIAGNOSIS — C189 Malignant neoplasm of colon, unspecified: Secondary | ICD-10-CM

## 2012-10-10 DIAGNOSIS — C787 Secondary malignant neoplasm of liver and intrahepatic bile duct: Secondary | ICD-10-CM

## 2012-10-10 DIAGNOSIS — C19 Malignant neoplasm of rectosigmoid junction: Secondary | ICD-10-CM

## 2012-10-10 DIAGNOSIS — C7970 Secondary malignant neoplasm of unspecified adrenal gland: Secondary | ICD-10-CM

## 2012-10-10 MED ORDER — HEPARIN SOD (PORK) LOCK FLUSH 100 UNIT/ML IV SOLN
500.0000 [IU] | Freq: Once | INTRAVENOUS | Status: AC | PRN
  Administered 2012-10-10: 500 [IU]
  Filled 2012-10-10: qty 5

## 2012-10-10 MED ORDER — SODIUM CHLORIDE 0.9 % IJ SOLN
10.0000 mL | INTRAMUSCULAR | Status: DC | PRN
  Administered 2012-10-10: 10 mL
  Filled 2012-10-10: qty 10

## 2012-10-10 NOTE — Progress Notes (Signed)
Patient had approximately 75 ml of 5 FU left in the bag. Patient refused the remainder of the medicine. MD notified.

## 2012-10-22 ENCOUNTER — Other Ambulatory Visit: Payer: BC Managed Care – PPO | Admitting: Lab

## 2012-10-22 ENCOUNTER — Telehealth: Payer: Self-pay | Admitting: *Deleted

## 2012-10-22 ENCOUNTER — Encounter: Payer: Self-pay | Admitting: Oncology

## 2012-10-22 ENCOUNTER — Ambulatory Visit: Payer: BC Managed Care – PPO

## 2012-10-22 ENCOUNTER — Ambulatory Visit: Payer: BC Managed Care – PPO | Admitting: Oncology

## 2012-10-22 NOTE — Telephone Encounter (Signed)
Unable to reach patient by phone, will try to e-mail him.

## 2012-11-03 ENCOUNTER — Encounter: Payer: Self-pay | Admitting: Oncology

## 2012-11-04 ENCOUNTER — Ambulatory Visit
Admission: RE | Admit: 2012-11-04 | Discharge: 2012-11-04 | Disposition: A | Discharge status: Home / Self Care | Payer: BC Managed Care – PPO | Source: Ambulatory Visit | Attending: Interventional Radiology | Admitting: Interventional Radiology

## 2012-11-04 DIAGNOSIS — C189 Malignant neoplasm of colon, unspecified: Secondary | ICD-10-CM

## 2012-11-04 NOTE — Progress Notes (Signed)
Appetite good.  Weight stable.  Occasional nausea.  Occasional diarrhea.  Occasional abdominal discomfort.

## 2012-11-05 ENCOUNTER — Other Ambulatory Visit (HOSPITAL_BASED_OUTPATIENT_CLINIC_OR_DEPARTMENT_OTHER): Payer: BC Managed Care – PPO | Admitting: Lab

## 2012-11-05 ENCOUNTER — Ambulatory Visit (HOSPITAL_BASED_OUTPATIENT_CLINIC_OR_DEPARTMENT_OTHER): Payer: BC Managed Care – PPO

## 2012-11-05 ENCOUNTER — Ambulatory Visit (HOSPITAL_BASED_OUTPATIENT_CLINIC_OR_DEPARTMENT_OTHER): Payer: BC Managed Care – PPO | Admitting: Oncology

## 2012-11-05 ENCOUNTER — Ambulatory Visit: Payer: BC Managed Care – PPO | Admitting: Lab

## 2012-11-05 VITALS — BP 121/71 | HR 74 | Temp 97.0°F | Resp 20 | Ht 72.0 in | Wt 191.5 lb

## 2012-11-05 VITALS — BP 143/86 | HR 64 | Temp 97.2°F | Resp 20

## 2012-11-05 DIAGNOSIS — C787 Secondary malignant neoplasm of liver and intrahepatic bile duct: Secondary | ICD-10-CM

## 2012-11-05 DIAGNOSIS — C189 Malignant neoplasm of colon, unspecified: Secondary | ICD-10-CM

## 2012-11-05 DIAGNOSIS — C7970 Secondary malignant neoplasm of unspecified adrenal gland: Secondary | ICD-10-CM

## 2012-11-05 DIAGNOSIS — C19 Malignant neoplasm of rectosigmoid junction: Secondary | ICD-10-CM

## 2012-11-05 DIAGNOSIS — Z5112 Encounter for antineoplastic immunotherapy: Secondary | ICD-10-CM

## 2012-11-05 LAB — CBC WITH DIFFERENTIAL/PLATELET
BASO%: 0.6 % (ref 0.0–2.0)
EOS%: 3.3 % (ref 0.0–7.0)
Eosinophils Absolute: 0.2 10*3/uL (ref 0.0–0.5)
LYMPH%: 23.4 % (ref 14.0–49.0)
MCHC: 34.1 g/dL (ref 32.0–36.0)
MCV: 89.6 fL (ref 79.3–98.0)
MONO%: 14.2 % — ABNORMAL HIGH (ref 0.0–14.0)
NEUT#: 2.9 10*3/uL (ref 1.5–6.5)
Platelets: 211 10*3/uL (ref 140–400)
RBC: 4.62 10*6/uL (ref 4.20–5.82)
RDW: 13.7 % (ref 11.0–14.6)
nRBC: 0 % (ref 0–0)

## 2012-11-05 LAB — COMPREHENSIVE METABOLIC PANEL (CC13)
ALT: 15 U/L (ref 0–55)
AST: 16 U/L (ref 5–34)
Alkaline Phosphatase: 51 U/L (ref 40–150)
CO2: 26 mEq/L (ref 22–29)
Creatinine: 0.8 mg/dL (ref 0.7–1.3)
Sodium: 139 mEq/L (ref 136–145)
Total Bilirubin: 0.81 mg/dL (ref 0.20–1.20)
Total Protein: 7.1 g/dL (ref 6.4–8.3)

## 2012-11-05 LAB — UA PROTEIN, DIPSTICK - CHCC: Protein, ur: NEGATIVE mg/dL

## 2012-11-05 MED ORDER — DEXAMETHASONE SODIUM PHOSPHATE 4 MG/ML IJ SOLN
20.0000 mg | Freq: Once | INTRAMUSCULAR | Status: AC
  Administered 2012-11-05: 20 mg via INTRAVENOUS

## 2012-11-05 MED ORDER — ATROPINE SULFATE 0.4 MG/ML IJ SOLN
0.5000 mg | Freq: Once | INTRAMUSCULAR | Status: DC | PRN
  Filled 2012-11-05: qty 1.25

## 2012-11-05 MED ORDER — SODIUM CHLORIDE 0.9 % IV SOLN
5.0000 mg/kg | Freq: Once | INTRAVENOUS | Status: AC
  Administered 2012-11-05: 450 mg via INTRAVENOUS
  Filled 2012-11-05: qty 18

## 2012-11-05 MED ORDER — ONDANSETRON 16 MG/50ML IVPB (CHCC)
16.0000 mg | Freq: Once | INTRAVENOUS | Status: AC
  Administered 2012-11-05: 16 mg via INTRAVENOUS

## 2012-11-05 MED ORDER — FLUOROURACIL CHEMO INJECTION 2.5 GM/50ML
400.0000 mg/m2 | Freq: Once | INTRAVENOUS | Status: AC
  Administered 2012-11-05: 800 mg via INTRAVENOUS
  Filled 2012-11-05: qty 16

## 2012-11-05 MED ORDER — LEUCOVORIN CALCIUM INJECTION 350 MG
396.0000 mg/m2 | Freq: Once | INTRAVENOUS | Status: AC
  Administered 2012-11-05: 800 mg via INTRAVENOUS
  Filled 2012-11-05: qty 40

## 2012-11-05 MED ORDER — FLUOROURACIL CHEMO INJECTION 5 GM/100ML
2400.0000 mg/m2 | INTRAVENOUS | Status: DC
  Administered 2012-11-05: 4850 mg via INTRAVENOUS
  Filled 2012-11-05: qty 97

## 2012-11-05 MED ORDER — IRINOTECAN HCL CHEMO INJECTION 100 MG/5ML
178.0000 mg/m2 | Freq: Once | INTRAVENOUS | Status: AC
  Administered 2012-11-05: 360 mg via INTRAVENOUS
  Filled 2012-11-05: qty 18

## 2012-11-05 MED ORDER — SODIUM CHLORIDE 0.9 % IV SOLN
Freq: Once | INTRAVENOUS | Status: AC
  Administered 2012-11-05: 10:00:00 via INTRAVENOUS

## 2012-11-05 NOTE — Progress Notes (Signed)
Pre and Post Avastin vital signs stable.

## 2012-11-05 NOTE — Patient Instructions (Signed)
Patient aware of next appointment; discharged home with no complaints. 

## 2012-11-05 NOTE — Progress Notes (Signed)
Hematology and Oncology Follow Up Visit  Christopher Jimenez [id number] [birth year] 62 y.o. 10/08/2012 8:47 AM  CC: Shirley Friar, MD  Ollen Gross. Vernell Morgans, M.D.  Billie Lade, Ph.D., M.D.    Principle Diagnosis: A 62 year old gentleman diagnosed with a T3 N1 rectosigmoid adenocarcinoma.  He had 1/14 lymph nodes involved diagnosed in December  2010. 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   Prior Therapy: 1. Underwent a lower anterior resection done in January 2012. 2. Underwent adjuvant radiation therapy with Xeloda concluded in March 2011. 3. Received adjuvant FOLFOX therapy concluded in July 2012. 4. The patient had involvement of his adrenal gland and underwent adrenalectomy on April 2012 under the care of Dr. Johna Sheriff. 5. He is status post a microwave ablation of the hepatic metastasis done July 06, 2011.  Current therapy: On systemic chemotherapy with FOLFIRI/Avastin started on 07/21/12. Here for cycle 5 today.  Interim History: Christopher Jimenez presents today for a followup visit.  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 He is tolerating chemo well. He had constipation initially then developed diarrhea after using stool softeners. Diarrhea has resolved today. He is not reporting any abdominal pain.  He has nausea and used his anti-emetics which were effective. His performance status activity level remains excellent.  He does report some occasional fatigue at exertion but had not report any shortness of breath.  Had not reported any cough, not associated with any hemoptysis or hematemesis. No fevers or chills. No new symptoms reported  Medications: I have reviewed the patient's current medications. No current outpatient prescriptions on file.  Allergies: No Known Allergies  Past Medical  History, Surgical history, Social history, and Family History were reviewed and updated.  Review of Systems: Constitutional:  Negative for fever, chills, night sweats, anorexia, weight loss, pain. Cardiovascular: no chest pain or dyspnea on exertion Respiratory: no cough, shortness of breath, or wheezing Neurological: no TIA or stroke symptoms Dermatological: negative ENT: negative Skin: Negative. Gastrointestinal: no abdominal pain, change in bowel habits, or black or bloody stools Genito-Urinary: no dysuria, trouble voiding, or hematuria Hematological and Lymphatic: negative Breast: negative Musculoskeletal: negative Remaining ROS negative.  Physical Exam: Blood pressure 147/90, pulse 75, temperature 97.2 F (36.2 C), temperature source Oral, resp. rate 20, height 6' (1.829 m), weight 197 lb 4.8 oz (89.495 kg). ECOG: 0 General appearance: alert Head: Normocephalic, without obvious abnormality, atraumatic Neck: no adenopathy, no carotid bruit, no JVD, supple, symmetrical, trachea midline and thyroid not enlarged, symmetric, no tenderness/mass/nodules Lymph nodes: Cervical, supraclavicular, and axillary nodes normal. Heart:regular rate and rhythm, S1, S2 normal, no murmur, click, rub or gallop Lung:chest clear, no wheezing, rales, normal symmetric air entry Abdomen: soft, non-tender, without masses or organomegaly EXT:no erythema, induration, or nodules. Skin: a small skin lesion noted on left side of his forehead. No bleeding or irritation noted. It is raised, non-melanotic in nature.    Lab Results: Lab Results  Component Value Date   WBC 6.2 10/08/2012   HGB 13.7 10/08/2012   HCT 39.2 10/08/2012   MCV 88.3 10/08/2012   PLT 190 10/08/2012     Chemistry      Component Value Date/Time   NA 138 09/02/2012 1129   NA 142 02/19/2012 0910   NA 140 12/20/2011 1016   K 4.3 09/02/2012 1129  K 5.0* 02/19/2012 0910   K 4.5 12/20/2011 1016   CL 104 09/02/2012 1129   CL 99 02/19/2012  0910   CL 105 12/20/2011 1016   CO2 26 09/02/2012 1129   CO2 30 02/19/2012 0910   CO2 27 12/20/2011 1016   BUN 15.0 09/02/2012 1129   BUN 14 02/19/2012 0910   BUN 18 12/20/2011 1016   CREATININE 0.9 09/02/2012 1129   CREATININE 1.0 02/19/2012 0910   CREATININE 0.90 12/20/2011 1016      Component Value Date/Time   CALCIUM 9.6 09/02/2012 1129   CALCIUM 8.5 02/19/2012 0910   CALCIUM 9.0 12/20/2011 1016   ALKPHOS 82 09/02/2012 1129   ALKPHOS 53 02/19/2012 0910   ALKPHOS 59 12/20/2011 1016   AST 33 09/02/2012 1129   AST 21 02/19/2012 0910   AST 15 12/20/2011 1016   ALT 39 09/02/2012 1129   ALT 13 12/20/2011 1016   BILITOT 1.12 09/02/2012 1129   BILITOT 0.80 02/19/2012 0910   BILITOT 0.5 12/20/2011 1016      Impression and Plan: This is a pleasant 62 year old gentleman with the following issues: 1. Stage IV colorectal cancer.  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  He had a liver metastasis that has been ablated.  His CT scan from 09/2012 and showed stable disease. The plan is to proceed with Cycle 5 today and 6 in two weeks. He is tolerating chemotherapy well.  2. Neutropenia. Due to chemo. Resolved.  3. Port-A-Cath management.  PAC in place for chemotherapy. 4. Follow-up. In 2 weeks.     Cassidie Veiga 1/15/20148:47 AM

## 2012-11-06 ENCOUNTER — Telehealth: Payer: Self-pay | Admitting: Oncology

## 2012-11-06 NOTE — Telephone Encounter (Signed)
Added appts for 2/26, 2/28, 3/12 and 3/14. Not able to reach pt by phone or lm. Added note to comment of 2/14 appt to send pt for new schedule and check phone numbers. Staff message to [facility] re time for 3/12 f/u - 830am slot taken.

## 2012-11-07 ENCOUNTER — Telehealth: Payer: Self-pay | Admitting: Oncology

## 2012-11-07 ENCOUNTER — Other Ambulatory Visit: Payer: Self-pay | Admitting: Certified Registered Nurse Anesthetist

## 2012-11-07 ENCOUNTER — Ambulatory Visit (HOSPITAL_BASED_OUTPATIENT_CLINIC_OR_DEPARTMENT_OTHER): Payer: BC Managed Care – PPO

## 2012-11-07 VITALS — BP 131/79 | HR 72 | Temp 98.0°F

## 2012-11-07 DIAGNOSIS — C787 Secondary malignant neoplasm of liver and intrahepatic bile duct: Secondary | ICD-10-CM

## 2012-11-07 DIAGNOSIS — C189 Malignant neoplasm of colon, unspecified: Secondary | ICD-10-CM

## 2012-11-07 DIAGNOSIS — C19 Malignant neoplasm of rectosigmoid junction: Secondary | ICD-10-CM

## 2012-11-07 MED ORDER — SODIUM CHLORIDE 0.9 % IJ SOLN
10.0000 mL | INTRAMUSCULAR | Status: DC | PRN
  Administered 2012-11-07: 10 mL
  Filled 2012-11-07: qty 10

## 2012-11-07 MED ORDER — COLD PACK MISC ONCOLOGY
1.0000 | Freq: Once | Status: DC | PRN
  Filled 2012-11-07: qty 1

## 2012-11-07 MED ORDER — HEPARIN SOD (PORK) LOCK FLUSH 100 UNIT/ML IV SOLN
500.0000 [IU] | Freq: Once | INTRAVENOUS | Status: AC | PRN
  Administered 2012-11-07: 500 [IU]
  Filled 2012-11-07: qty 5

## 2012-11-07 NOTE — Telephone Encounter (Signed)
Added lb/fu for pt 3/12. Pt number still not working and pt was to get new schedule when he came in today. Mailed schedule for February and March.

## 2012-11-08 ENCOUNTER — Other Ambulatory Visit: Payer: Self-pay

## 2012-11-19 ENCOUNTER — Ambulatory Visit (HOSPITAL_BASED_OUTPATIENT_CLINIC_OR_DEPARTMENT_OTHER): Payer: BC Managed Care – PPO | Admitting: Oncology

## 2012-11-19 ENCOUNTER — Ambulatory Visit (HOSPITAL_BASED_OUTPATIENT_CLINIC_OR_DEPARTMENT_OTHER): Payer: BC Managed Care – PPO

## 2012-11-19 ENCOUNTER — Encounter: Payer: Self-pay | Admitting: Oncology

## 2012-11-19 ENCOUNTER — Other Ambulatory Visit (HOSPITAL_BASED_OUTPATIENT_CLINIC_OR_DEPARTMENT_OTHER): Payer: BC Managed Care – PPO | Admitting: Lab

## 2012-11-19 VITALS — BP 111/70 | HR 72 | Temp 97.0°F | Resp 20 | Ht 72.0 in | Wt 188.6 lb

## 2012-11-19 DIAGNOSIS — C189 Malignant neoplasm of colon, unspecified: Secondary | ICD-10-CM

## 2012-11-19 DIAGNOSIS — C19 Malignant neoplasm of rectosigmoid junction: Secondary | ICD-10-CM

## 2012-11-19 DIAGNOSIS — C787 Secondary malignant neoplasm of liver and intrahepatic bile duct: Secondary | ICD-10-CM

## 2012-11-19 DIAGNOSIS — Z5112 Encounter for antineoplastic immunotherapy: Secondary | ICD-10-CM

## 2012-11-19 LAB — CBC WITH DIFFERENTIAL/PLATELET
BASO%: 0.7 % (ref 0.0–2.0)
Basophils Absolute: 0 10*3/uL (ref 0.0–0.1)
HCT: 38 % — ABNORMAL LOW (ref 38.4–49.9)
HGB: 13.1 g/dL (ref 13.0–17.1)
LYMPH%: 24.7 % (ref 14.0–49.0)
MCHC: 34.5 g/dL (ref 32.0–36.0)
MONO#: 0.4 10*3/uL (ref 0.1–0.9)
NEUT%: 55 % (ref 39.0–75.0)
Platelets: 162 10*3/uL (ref 140–400)
WBC: 3 10*3/uL — ABNORMAL LOW (ref 4.0–10.3)
lymph#: 0.7 10*3/uL — ABNORMAL LOW (ref 0.9–3.3)

## 2012-11-19 LAB — COMPREHENSIVE METABOLIC PANEL (CC13)
BUN: 15.8 mg/dL (ref 7.0–26.0)
CO2: 26 mEq/L (ref 22–29)
Calcium: 9.6 mg/dL (ref 8.4–10.4)
Chloride: 107 mEq/L (ref 98–107)
Creatinine: 0.9 mg/dL (ref 0.7–1.3)
Glucose: 100 mg/dl — ABNORMAL HIGH (ref 70–99)
Total Bilirubin: 0.98 mg/dL (ref 0.20–1.20)

## 2012-11-19 LAB — CEA: CEA: 13.4 ng/mL — ABNORMAL HIGH (ref 0.0–5.0)

## 2012-11-19 MED ORDER — SODIUM CHLORIDE 0.9 % IJ SOLN
10.0000 mL | INTRAMUSCULAR | Status: DC | PRN
  Filled 2012-11-19: qty 10

## 2012-11-19 MED ORDER — LEUCOVORIN CALCIUM INJECTION 350 MG
396.0000 mg/m2 | Freq: Once | INTRAVENOUS | Status: AC
  Administered 2012-11-19: 800 mg via INTRAVENOUS
  Filled 2012-11-19: qty 40

## 2012-11-19 MED ORDER — SODIUM CHLORIDE 0.9 % IV SOLN
2400.0000 mg/m2 | INTRAVENOUS | Status: DC
  Administered 2012-11-19: 4850 mg via INTRAVENOUS
  Filled 2012-11-19: qty 97

## 2012-11-19 MED ORDER — FLUOROURACIL CHEMO INJECTION 2.5 GM/50ML
400.0000 mg/m2 | Freq: Once | INTRAVENOUS | Status: AC
  Administered 2012-11-19: 800 mg via INTRAVENOUS
  Filled 2012-11-19: qty 16

## 2012-11-19 MED ORDER — IRINOTECAN HCL CHEMO INJECTION 100 MG/5ML
180.0000 mg/m2 | Freq: Once | INTRAVENOUS | Status: AC
  Administered 2012-11-19: 364 mg via INTRAVENOUS
  Filled 2012-11-19: qty 18.2

## 2012-11-19 MED ORDER — ONDANSETRON 16 MG/50ML IVPB (CHCC)
16.0000 mg | Freq: Once | INTRAVENOUS | Status: AC
  Administered 2012-11-19: 16 mg via INTRAVENOUS

## 2012-11-19 MED ORDER — ATROPINE SULFATE 1 MG/ML IJ SOLN: 0.5000 mg | Freq: Once | INTRAMUSCULAR | Status: DC | PRN

## 2012-11-19 MED ORDER — DEXAMETHASONE SODIUM PHOSPHATE 4 MG/ML IJ SOLN
20.0000 mg | Freq: Once | INTRAMUSCULAR | Status: AC
  Administered 2012-11-19: 20 mg via INTRAVENOUS

## 2012-11-19 MED ORDER — SODIUM CHLORIDE 0.9 % IV SOLN
Freq: Once | INTRAVENOUS | Status: AC
  Administered 2012-11-19: 12:00:00 via INTRAVENOUS

## 2012-11-19 MED ORDER — SODIUM CHLORIDE 0.9 % IV SOLN
5.0000 mg/kg | Freq: Once | INTRAVENOUS | Status: AC
  Administered 2012-11-19: 450 mg via INTRAVENOUS
  Filled 2012-11-19: qty 18

## 2012-11-19 MED ORDER — HEPARIN SOD (PORK) LOCK FLUSH 100 UNIT/ML IV SOLN
500.0000 [IU] | Freq: Once | INTRAVENOUS | Status: DC | PRN
  Filled 2012-11-19: qty 5

## 2012-11-19 NOTE — Progress Notes (Signed)
Hematology and Oncology Follow Up Visit  Christopher Jimenez [id number] [birth year] 62 y.o. 11/19/2012 11:08 AM [name],Christopher Jimenez, MDRoss, Christopher Most, MD   Principle Diagnosis: A 62 year old gentleman diagnosed with a T3 N1 rectosigmoid adenocarcinoma. He had 1/14 lymph nodes involved diagnosed in December 2010. 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  Prior Therapy: 1. Underwent a lower anterior resection done in January 2012. 2. Underwent adjuvant radiation therapy with Xeloda concluded in March 2011. 3. Received adjuvant FOLFOX therapy concluded in July 2012. 4. The patient had involvement of his adrenal gland and underwent adrenalectomy on April 2012 under the care of Dr. Johna Sheriff. 5. He is status post a microwave ablation of the hepatic metastasis done July 06, 2011.  Current therapy: On systemic chemotherapy with FOLFIRI/Avastin started on 07/21/12. Here for cycle 6 today.   Interim History:  [name] presents today for a followup visit. 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 He is tolerating chemo well. He had constipation initially then developed diarrhea after using stool softeners. Diarrhea has resolved today. He is not reporting any abdominal pain. He has nausea and used his anti-emetics which were effective. His performance status activity level remains excellent. He does report some occasional fatigue at exertion but had not report any shortness of breath. Had not reported any cough, not associated with any hemoptysis or hematemesis. No fevers or chills. No new symptoms reported   Medications: I have reviewed the patient's current medications. No current outpatient prescriptions on file.  Allergies: No Known Allergies  Past Medical History, Surgical history, Social history, and Family  History were reviewed and updated.  Review of Systems: Constitutional:  Negative for fever, chills, night sweats, anorexia, weight loss, pain. Cardiovascular: no chest pain or dyspnea on exertion Respiratory: no cough, shortness of breath, or wheezing Neurological: no TIA or stroke symptoms Dermatological: negative ENT: negative Skin: Negative. Gastrointestinal: no abdominal pain, change in bowel habits, or black or bloody stools Genito-Urinary: no dysuria, trouble voiding, or hematuria Hematological and Lymphatic: negative Breast: negative for breast lumps Musculoskeletal: negative Remaining ROS negative.  Physical Exam: Blood pressure 111/70, pulse 72, temperature 97 F (36.1 C), temperature source Oral, resp. rate 20, height 6' (1.829 m), weight 188 lb 9.6 oz (85.548 kg). ECOG: 0 General appearance: alert, cooperative and no distress Head: Normocephalic, without obvious abnormality, atraumatic Neck: no adenopathy, no carotid bruit, no JVD, supple, symmetrical, trachea midline and thyroid not enlarged, symmetric, no tenderness/mass/nodules Lymph nodes: Cervical, supraclavicular, and axillary nodes normal. Heart:regular rate and rhythm, S1, S2 normal, no murmur, click, rub or gallop Lung:chest clear, no wheezing, rales, normal symmetric air entry, no tachypnea, retractions or cyanosis Abdomen: soft, non-tender, without masses or organomegaly EXT:no erythema, induration, or nodules   Lab Results: Lab Results  Component Value Date   WBC 3.0* 11/19/2012   HGB 13.1 11/19/2012   HCT 38.0* 11/19/2012   MCV 88.4 11/19/2012   PLT 162 11/19/2012     Chemistry      Component Value Date/Time   NA 139 11/05/2012 0838   NA 142 02/19/2012 0910   NA 140 12/20/2011 1016   K 4.3 11/05/2012 0838   K 5.0* 02/19/2012 0910   K 4.5 12/20/2011 1016   CL 105 11/05/2012 0838   CL 99 02/19/2012 0910   CL 105 12/20/2011 1016   CO2 26  11/05/2012 0838   CO2 30 02/19/2012 0910   CO2 27 12/20/2011 1016   BUN  16.6 11/05/2012 0838   BUN 14 02/19/2012 0910   BUN 18 12/20/2011 1016   CREATININE 0.8 11/05/2012 0838   CREATININE 1.0 02/19/2012 0910   CREATININE 0.90 12/20/2011 1016      Component Value Date/Time   CALCIUM 9.4 11/05/2012 0838   CALCIUM 8.5 02/19/2012 0910   CALCIUM 9.0 12/20/2011 1016   ALKPHOS 51 11/05/2012 0838   ALKPHOS 53 02/19/2012 0910   ALKPHOS 59 12/20/2011 1016   AST 16 11/05/2012 0838   AST 21 02/19/2012 0910   AST 15 12/20/2011 1016   ALT 15 11/05/2012 0838   ALT 13 12/20/2011 1016   BILITOT 0.81 11/05/2012 0838   BILITOT 0.80 02/19/2012 0910   BILITOT 0.5 12/20/2011 1016     Impression and Plan: This is a pleasant 62 year old gentleman with the following issues:  1. Stage IV colorectal cancer. 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 He had a liver metastasis that has been ablated. His CT scan from 09/2012 and showed stable disease. The plan is to proceed with Cycle 6 today and 7 in two weeks. He is tolerating chemotherapy well. He inquired about switching chemo to every 3 weeks. He plans to discuss this further at his next visit. 2. Neutropenia. Due to chemo. Resolved. 3. Port-A-Cath management. PAC in place for chemotherapy. 4. Follow-up. In 2 weeks.   Spent more than half the time coordinating care.    [place], [place] 2/26/201411:08 AM

## 2012-11-19 NOTE — Patient Instructions (Signed)
[facility] Discharge Instructions for Patients Receiving Chemotherapy  Today you received the following chemotherapy agents: avastin, 47fu, leucovorin, irinotecan,.  To help prevent nausea and vomiting after your treatment, we encourage you to take your nausea medication.  Take it as often as prescribed.     If you develop nausea and vomiting that is not controlled by your nausea medication, call the clinic. If it is after clinic hours your family physician or the after hours number for the clinic or go to the Emergency Department.   BELOW ARE SYMPTOMS THAT SHOULD BE REPORTED IMMEDIATELY:  *FEVER GREATER THAN 100.5 F  *CHILLS WITH OR WITHOUT FEVER  NAUSEA AND VOMITING THAT IS NOT CONTROLLED WITH YOUR NAUSEA MEDICATION  *UNUSUAL SHORTNESS OF BREATH  *UNUSUAL BRUISING OR BLEEDING  TENDERNESS IN MOUTH AND THROAT WITH OR WITHOUT PRESENCE OF ULCERS  *URINARY PROBLEMS  *BOWEL PROBLEMS  UNUSUAL RASH Items with * indicate a potential emergency and should be followed up as soon as possible.  Feel free to call the clinic you have any questions or concerns. The clinic phone number is [phone].   I have been informed and understand all the instructions given to me. I know to contact the clinic, my physician, or go to the Emergency Department if any problems should occur. I do not have any questions at this time, but understand that I may call the clinic during office hours   should I have any questions or need assistance in obtaining follow up care.    __________________________________________  _____________  __________ Signature of Patient or Authorized Representative            Date                   Time    __________________________________________ Nurse's Signature

## 2012-11-21 ENCOUNTER — Ambulatory Visit (HOSPITAL_BASED_OUTPATIENT_CLINIC_OR_DEPARTMENT_OTHER): Payer: BC Managed Care – PPO

## 2012-11-21 VITALS — BP 134/81 | HR 59 | Temp 96.5°F

## 2012-11-21 DIAGNOSIS — C787 Secondary malignant neoplasm of liver and intrahepatic bile duct: Secondary | ICD-10-CM

## 2012-11-21 DIAGNOSIS — C189 Malignant neoplasm of colon, unspecified: Secondary | ICD-10-CM

## 2012-11-21 DIAGNOSIS — C19 Malignant neoplasm of rectosigmoid junction: Secondary | ICD-10-CM

## 2012-11-21 MED ORDER — HEPARIN SOD (PORK) LOCK FLUSH 100 UNIT/ML IV SOLN
500.0000 [IU] | Freq: Once | INTRAVENOUS | Status: AC | PRN
  Administered 2012-11-21: 500 [IU]
  Filled 2012-11-21: qty 5

## 2012-11-21 MED ORDER — SODIUM CHLORIDE 0.9 % IJ SOLN
10.0000 mL | INTRAMUSCULAR | Status: DC | PRN
  Administered 2012-11-21: 10 mL
  Filled 2012-11-21: qty 10

## 2012-11-25 ENCOUNTER — Encounter: Payer: Self-pay | Admitting: Oncology

## 2012-11-25 NOTE — Progress Notes (Signed)
Put disability forms on nurse's desk. °

## 2012-11-26 ENCOUNTER — Encounter: Payer: Self-pay | Admitting: Oncology

## 2012-11-26 ENCOUNTER — Telehealth: Payer: Self-pay | Admitting: *Deleted

## 2012-11-26 NOTE — Telephone Encounter (Signed)
Pt called regarding Disability ppwk. Per note from [employer] in Managed care, documents were faxed this morning to [facility] @ [employer] and Long Term Disabilty ppwk to [employer]. Pt requesting the forms sent to [employer] also be sent to [place] at [facility].  Will notify [facility], Managed Care.

## 2012-11-26 NOTE — Progress Notes (Signed)
Faxed disability form to Christopher Jimenez, [employer], @ [phone].

## 2012-11-26 NOTE — Progress Notes (Signed)
Faxed long term disability form to [employer] @ [phone].

## 2012-12-03 ENCOUNTER — Ambulatory Visit (HOSPITAL_BASED_OUTPATIENT_CLINIC_OR_DEPARTMENT_OTHER): Payer: BC Managed Care – PPO

## 2012-12-03 ENCOUNTER — Other Ambulatory Visit (HOSPITAL_BASED_OUTPATIENT_CLINIC_OR_DEPARTMENT_OTHER): Payer: BC Managed Care – PPO | Admitting: Lab

## 2012-12-03 ENCOUNTER — Ambulatory Visit (HOSPITAL_BASED_OUTPATIENT_CLINIC_OR_DEPARTMENT_OTHER): Payer: BC Managed Care – PPO | Admitting: Oncology

## 2012-12-03 ENCOUNTER — Telehealth: Payer: Self-pay | Admitting: Oncology

## 2012-12-03 VITALS — BP 119/75 | HR 63 | Temp 96.7°F | Resp 18 | Ht 72.0 in | Wt 189.5 lb

## 2012-12-03 DIAGNOSIS — C19 Malignant neoplasm of rectosigmoid junction: Secondary | ICD-10-CM

## 2012-12-03 DIAGNOSIS — Z5112 Encounter for antineoplastic immunotherapy: Secondary | ICD-10-CM

## 2012-12-03 DIAGNOSIS — C189 Malignant neoplasm of colon, unspecified: Secondary | ICD-10-CM

## 2012-12-03 DIAGNOSIS — C787 Secondary malignant neoplasm of liver and intrahepatic bile duct: Secondary | ICD-10-CM

## 2012-12-03 DIAGNOSIS — Z5111 Encounter for antineoplastic chemotherapy: Secondary | ICD-10-CM

## 2012-12-03 LAB — COMPREHENSIVE METABOLIC PANEL (CC13)
AST: 15 U/L (ref 5–34)
Alkaline Phosphatase: 45 U/L (ref 40–150)
BUN: 17.8 mg/dL (ref 7.0–26.0)
Calcium: 9.1 mg/dL (ref 8.4–10.4)
Chloride: 105 mEq/L (ref 98–107)
Creatinine: 0.8 mg/dL (ref 0.7–1.3)
Total Bilirubin: 0.42 mg/dL (ref 0.20–1.20)

## 2012-12-03 LAB — CBC WITH DIFFERENTIAL/PLATELET
BASO%: 0.6 % (ref 0.0–2.0)
EOS%: 3.6 % (ref 0.0–7.0)
HCT: 38.2 % — ABNORMAL LOW (ref 38.4–49.9)
LYMPH%: 33.4 % (ref 14.0–49.0)
MCH: 30.5 pg (ref 27.2–33.4)
MCHC: 33.5 g/dL (ref 32.0–36.0)
MCV: 91 fL (ref 79.3–98.0)
MONO%: 14.2 % — ABNORMAL HIGH (ref 0.0–14.0)
NEUT%: 48.2 % (ref 39.0–75.0)
Platelets: 176 10*3/uL (ref 140–400)
RBC: 4.2 10*6/uL (ref 4.20–5.82)

## 2012-12-03 MED ORDER — ATROPINE SULFATE 0.4 MG/ML IJ SOLN
0.5000 mg | Freq: Once | INTRAMUSCULAR | Status: DC | PRN
  Filled 2012-12-03: qty 1.25

## 2012-12-03 MED ORDER — LEUCOVORIN CALCIUM INJECTION 350 MG
396.0000 mg/m2 | Freq: Once | INTRAVENOUS | Status: AC
  Administered 2012-12-03: 800 mg via INTRAVENOUS
  Filled 2012-12-03: qty 40

## 2012-12-03 MED ORDER — DEXAMETHASONE SODIUM PHOSPHATE 4 MG/ML IJ SOLN
20.0000 mg | Freq: Once | INTRAMUSCULAR | Status: AC
  Administered 2012-12-03: 20 mg via INTRAVENOUS

## 2012-12-03 MED ORDER — FLUOROURACIL CHEMO INJECTION 2.5 GM/50ML
400.0000 mg/m2 | Freq: Once | INTRAVENOUS | Status: AC
  Administered 2012-12-03: 800 mg via INTRAVENOUS
  Filled 2012-12-03: qty 16

## 2012-12-03 MED ORDER — SODIUM CHLORIDE 0.9 % IV SOLN
5.0000 mg/kg | Freq: Once | INTRAVENOUS | Status: AC
  Administered 2012-12-03: 450 mg via INTRAVENOUS
  Filled 2012-12-03: qty 18

## 2012-12-03 MED ORDER — ONDANSETRON 16 MG/50ML IVPB (CHCC)
16.0000 mg | Freq: Once | INTRAVENOUS | Status: AC
  Administered 2012-12-03: 16 mg via INTRAVENOUS

## 2012-12-03 MED ORDER — IRINOTECAN HCL CHEMO INJECTION 100 MG/5ML
178.0000 mg/m2 | Freq: Once | INTRAVENOUS | Status: AC
  Administered 2012-12-03: 360 mg via INTRAVENOUS
  Filled 2012-12-03: qty 18

## 2012-12-03 MED ORDER — SODIUM CHLORIDE 0.9 % IV SOLN
Freq: Once | INTRAVENOUS | Status: AC
  Administered 2012-12-03: 11:00:00 via INTRAVENOUS

## 2012-12-03 MED ORDER — SODIUM CHLORIDE 0.9 % IV SOLN
2400.0000 mg/m2 | INTRAVENOUS | Status: DC
  Administered 2012-12-03: 4850 mg via INTRAVENOUS
  Filled 2012-12-03: qty 97

## 2012-12-03 NOTE — Progress Notes (Signed)
Hematology and Oncology Follow Up Visit  Christopher Jimenez [id number] 1950-12-26 62 y.o. 12/03/2012 9:33 AM [name],[name] [name], MDRoss, [name], MD   Principle Diagnosis: A 62 year old gentleman diagnosed with a T3 N1 rectosigmoid adenocarcinoma. He had 1/14 lymph nodes involved diagnosed in December 2010. 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  Prior Therapy: 1. Underwent a lower anterior resection done in January 2012. 2. Underwent adjuvant radiation therapy with Xeloda concluded in March 2011. 3. Received adjuvant FOLFOX therapy concluded in July 2012. 4. The patient had involvement of his adrenal gland and underwent adrenalectomy on April 2012 under the care of Dr. Johna Sheriff. 5. He is status post a microwave ablation of the hepatic metastasis done July 06, 2011.  Current therapy: On systemic chemotherapy with FOLFIRI/Avastin started on 07/21/12. Here for cycle 7 today.   Interim History:  [name] presents today for a followup visit. 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 He is tolerating chemo well. He is not reporting any abdominal pain. He has nausea and used his anti-emetics which were effective. His performance status activity level remains excellent. He does report some occasional fatigue at exertion but had not report any shortness of breath. Had not reported any cough, not associated with any hemoptysis or hematemesis. No fevers or chills. No new symptoms reported   Medications: I have reviewed the patient's current medications. No current outpatient prescriptions on file.  Allergies: No Known Allergies  Past Medical History, Surgical history, Social history, and Family History were reviewed and updated.  Review of Systems: Constitutional:  Negative for fever, chills, night sweats,  anorexia, weight loss, pain. Cardiovascular: no chest pain or dyspnea on exertion Respiratory: no cough, shortness of breath, or wheezing Neurological: no TIA or stroke symptoms Dermatological: negative ENT: negative Skin: Negative. Gastrointestinal: no abdominal pain, change in bowel habits, or black or bloody stools Genito-Urinary: no dysuria, trouble voiding, or hematuria Hematological and Lymphatic: negative Breast: negative for breast lumps Musculoskeletal: negative Remaining ROS negative.  Physical Exam: Blood pressure 119/75, pulse 63, temperature 96.7 F (35.9 C), temperature source Oral, resp. rate 18, height 6' (1.829 m), weight 189 lb 8 oz (85.957 kg). ECOG: 0 General appearance: alert, cooperative and no distress Head: Normocephalic, without obvious abnormality, atraumatic Neck: no adenopathy, no carotid bruit, no JVD, supple, symmetrical, trachea midline and thyroid not enlarged, symmetric, no tenderness/mass/nodules Lymph nodes: Cervical, supraclavicular, and axillary nodes normal. Heart:regular rate and rhythm, S1, S2 normal, no murmur, click, rub or gallop Lung:chest clear, no wheezing, rales, normal symmetric air entry, no tachypnea, retractions or cyanosis Abdomen: soft, non-tender, without masses or organomegaly EXT:no erythema, induration, or nodules   Lab Results: Lab Results  Component Value Date   WBC 3.4* 12/03/2012   HGB 12.8* 12/03/2012   HCT 38.2* 12/03/2012   MCV 91.0 12/03/2012   PLT 176 12/03/2012     Chemistry      Component Value Date/Time   NA 141 11/19/2012 1023   NA 142 02/19/2012 0910   NA 140 12/20/2011 1016   K 4.3 11/19/2012 1023   K 5.0* 02/19/2012 0910   K 4.5 12/20/2011 1016   CL 107 11/19/2012 1023   CL 99 02/19/2012 0910   CL 105 12/20/2011 1016   CO2 26 11/19/2012 1023   CO2 30 02/19/2012 0910   CO2 27 12/20/2011 1016  BUN 15.8 11/19/2012 1023   BUN 14 02/19/2012 0910   BUN 18 12/20/2011 1016   CREATININE 0.9 11/19/2012 1023    CREATININE 1.0 02/19/2012 0910   CREATININE 0.90 12/20/2011 1016      Component Value Date/Time   CALCIUM 9.6 11/19/2012 1023   CALCIUM 8.5 02/19/2012 0910   CALCIUM 9.0 12/20/2011 1016   ALKPHOS 47 11/19/2012 1023   ALKPHOS 53 02/19/2012 0910   ALKPHOS 59 12/20/2011 1016   AST 16 11/19/2012 1023   AST 21 02/19/2012 0910   AST 15 12/20/2011 1016   ALT 13 11/19/2012 1023   ALT 13 12/20/2011 1016   BILITOT 0.98 11/19/2012 1023   BILITOT 0.80 02/19/2012 0910   BILITOT 0.5 12/20/2011 1016     Impression and Plan: This is a pleasant 62 year old gentleman with the following issues:  1. Stage IV colorectal cancer. 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 He had a liver metastasis that has been ablated. His CT scan from 09/2012 and showed stable disease. The plan is to proceed with Cycle 7 today and 8 in two weeks. He is tolerating chemotherapy well. He wants cycle 9 on 4/23 which we will arrange for.  2. Neutropenia. Due to chemo. Resolved. 3. Port-A-Cath management. PAC in place for chemotherapy. 4. Follow-up. In 2 weeks.     SHADAD,FIRAS 3/12/20149:33 AM

## 2012-12-03 NOTE — Patient Instructions (Addendum)
[facility] Discharge Instructions for Patients Receiving Chemotherapy  Today you received the following chemotherapy agents Avastin, Leucovorin, Irinotecan and Adrucil.  To help prevent nausea and vomiting after your treatment, we encourage you to take your nausea medication. Begin taking your nausea medication as often as prescribed for by Dr. Clelia Croft.    If you develop nausea and vomiting that is not controlled by your nausea medication, call the clinic. If it is after clinic hours your family physician or the after hours number for the clinic or go to the Emergency Department.   BELOW ARE SYMPTOMS THAT SHOULD BE REPORTED IMMEDIATELY:  *FEVER GREATER THAN 100.5 F  *CHILLS WITH OR WITHOUT FEVER  NAUSEA AND VOMITING THAT IS NOT CONTROLLED WITH YOUR NAUSEA MEDICATION  *UNUSUAL SHORTNESS OF BREATH  *UNUSUAL BRUISING OR BLEEDING  TENDERNESS IN MOUTH AND THROAT WITH OR WITHOUT PRESENCE OF ULCERS  *URINARY PROBLEMS  *BOWEL PROBLEMS  UNUSUAL RASH Items with * indicate a potential emergency and should be followed up as soon as possible.  One of the nurses will contact you 24 hours after your treatment. Please let the nurse know about any problems that you may have experienced. Feel free to call the clinic you have any questions or concerns. The clinic phone number is [phone].   I have been informed and understand all the instructions given to me. I know to contact the clinic, my physician, or go to the Emergency Department if any problems should occur. I do not have any questions at this time, but understand that I may call the clinic during office hours   should I have any questions or need assistance in obtaining follow up care.    __________________________________________  _____________  __________ Signature of Patient or Authorized Representative            Date                   Time    __________________________________________ Nurse's  Signature

## 2012-12-03 NOTE — Progress Notes (Signed)
1125 Result of urine check for Avastin negative.

## 2012-12-05 ENCOUNTER — Ambulatory Visit (HOSPITAL_BASED_OUTPATIENT_CLINIC_OR_DEPARTMENT_OTHER): Payer: BC Managed Care – PPO

## 2012-12-05 VITALS — BP 124/87 | HR 74 | Temp 97.7°F | Resp 18

## 2012-12-05 DIAGNOSIS — C189 Malignant neoplasm of colon, unspecified: Secondary | ICD-10-CM

## 2012-12-05 DIAGNOSIS — C19 Malignant neoplasm of rectosigmoid junction: Secondary | ICD-10-CM

## 2012-12-05 DIAGNOSIS — C787 Secondary malignant neoplasm of liver and intrahepatic bile duct: Secondary | ICD-10-CM

## 2012-12-05 MED ORDER — HEPARIN SOD (PORK) LOCK FLUSH 100 UNIT/ML IV SOLN
500.0000 [IU] | Freq: Once | INTRAVENOUS | Status: AC | PRN
  Administered 2012-12-05: 500 [IU]
  Filled 2012-12-05: qty 5

## 2012-12-05 MED ORDER — SODIUM CHLORIDE 0.9 % IJ SOLN
10.0000 mL | INTRAMUSCULAR | Status: DC | PRN
  Administered 2012-12-05: 10 mL
  Filled 2012-12-05: qty 10

## 2012-12-05 NOTE — Patient Instructions (Signed)
Call MD for problems 

## 2012-12-17 ENCOUNTER — Ambulatory Visit: Payer: BC Managed Care – PPO | Admitting: Oncology

## 2012-12-17 ENCOUNTER — Inpatient Hospital Stay: Payer: BC Managed Care – PPO

## 2012-12-17 ENCOUNTER — Other Ambulatory Visit: Payer: BC Managed Care – PPO | Admitting: Lab

## 2012-12-31 ENCOUNTER — Encounter: Payer: Self-pay | Admitting: Oncology

## 2012-12-31 NOTE — Progress Notes (Signed)
Put disability form on nurse's desk. °

## 2013-01-14 ENCOUNTER — Telehealth: Payer: Self-pay | Admitting: Oncology

## 2013-01-14 ENCOUNTER — Telehealth: Payer: Self-pay | Admitting: *Deleted

## 2013-01-14 ENCOUNTER — Ambulatory Visit (HOSPITAL_BASED_OUTPATIENT_CLINIC_OR_DEPARTMENT_OTHER): Payer: BC Managed Care – PPO

## 2013-01-14 ENCOUNTER — Other Ambulatory Visit (HOSPITAL_BASED_OUTPATIENT_CLINIC_OR_DEPARTMENT_OTHER): Payer: BC Managed Care – PPO | Admitting: Lab

## 2013-01-14 ENCOUNTER — Ambulatory Visit (HOSPITAL_BASED_OUTPATIENT_CLINIC_OR_DEPARTMENT_OTHER): Payer: BC Managed Care – PPO | Admitting: Oncology

## 2013-01-14 VITALS — BP 128/79 | HR 69 | Temp 97.6°F | Resp 18 | Ht 72.0 in | Wt 192.2 lb

## 2013-01-14 DIAGNOSIS — Z5111 Encounter for antineoplastic chemotherapy: Secondary | ICD-10-CM

## 2013-01-14 DIAGNOSIS — C19 Malignant neoplasm of rectosigmoid junction: Secondary | ICD-10-CM

## 2013-01-14 DIAGNOSIS — C787 Secondary malignant neoplasm of liver and intrahepatic bile duct: Secondary | ICD-10-CM

## 2013-01-14 DIAGNOSIS — C7970 Secondary malignant neoplasm of unspecified adrenal gland: Secondary | ICD-10-CM

## 2013-01-14 DIAGNOSIS — Z5112 Encounter for antineoplastic immunotherapy: Secondary | ICD-10-CM

## 2013-01-14 DIAGNOSIS — C189 Malignant neoplasm of colon, unspecified: Secondary | ICD-10-CM

## 2013-01-14 LAB — CBC WITH DIFFERENTIAL/PLATELET
BASO%: 0.5 % (ref 0.0–2.0)
Eosinophils Absolute: 0.6 10*3/uL — ABNORMAL HIGH (ref 0.0–0.5)
LYMPH%: 18.8 % (ref 14.0–49.0)
MCHC: 33.7 g/dL (ref 32.0–36.0)
MONO#: 0.8 10*3/uL (ref 0.1–0.9)
NEUT#: 3.6 10*3/uL (ref 1.5–6.5)
Platelets: 191 10*3/uL (ref 140–400)
RBC: 4.53 10*6/uL (ref 4.20–5.82)
RDW: 14.2 % (ref 11.0–14.6)
WBC: 6.1 10*3/uL (ref 4.0–10.3)
lymph#: 1.2 10*3/uL (ref 0.9–3.3)
nRBC: 0 % (ref 0–0)

## 2013-01-14 LAB — COMPREHENSIVE METABOLIC PANEL (CC13)
ALT: 12 U/L (ref 0–55)
AST: 16 U/L (ref 5–34)
CO2: 27 mEq/L (ref 22–29)
Calcium: 9.2 mg/dL (ref 8.4–10.4)
Chloride: 105 mEq/L (ref 98–107)
Creatinine: 0.8 mg/dL (ref 0.7–1.3)
Sodium: 139 mEq/L (ref 136–145)
Total Bilirubin: 0.84 mg/dL (ref 0.20–1.20)
Total Protein: 6.9 g/dL (ref 6.4–8.3)

## 2013-01-14 MED ORDER — SODIUM CHLORIDE 0.9 % IV SOLN
Freq: Once | INTRAVENOUS | Status: AC
  Administered 2013-01-14: 10:00:00 via INTRAVENOUS

## 2013-01-14 MED ORDER — ATROPINE SULFATE 0.4 MG/ML IJ SOLN
0.5000 mg | Freq: Once | INTRAMUSCULAR | Status: DC | PRN
  Filled 2013-01-14: qty 1.25

## 2013-01-14 MED ORDER — HEPARIN SOD (PORK) LOCK FLUSH 100 UNIT/ML IV SOLN
500.0000 [IU] | Freq: Once | INTRAVENOUS | Status: DC | PRN
  Filled 2013-01-14: qty 5

## 2013-01-14 MED ORDER — DEXAMETHASONE SODIUM PHOSPHATE 4 MG/ML IJ SOLN
20.0000 mg | Freq: Once | INTRAMUSCULAR | Status: AC
  Administered 2013-01-14: 20 mg via INTRAVENOUS

## 2013-01-14 MED ORDER — SODIUM CHLORIDE 0.9 % IV SOLN
5.0000 mg/kg | Freq: Once | INTRAVENOUS | Status: AC
  Administered 2013-01-14: 450 mg via INTRAVENOUS
  Filled 2013-01-14: qty 18

## 2013-01-14 MED ORDER — SODIUM CHLORIDE 0.9 % IJ SOLN
10.0000 mL | INTRAMUSCULAR | Status: DC | PRN
  Filled 2013-01-14: qty 10

## 2013-01-14 MED ORDER — LEUCOVORIN CALCIUM INJECTION 350 MG
400.0000 mg/m2 | Freq: Once | INTRAVENOUS | Status: AC
  Administered 2013-01-14: 808 mg via INTRAVENOUS
  Filled 2013-01-14: qty 40.4

## 2013-01-14 MED ORDER — FLUOROURACIL CHEMO INJECTION 2.5 GM/50ML
400.0000 mg/m2 | Freq: Once | INTRAVENOUS | Status: AC
  Administered 2013-01-14: 800 mg via INTRAVENOUS
  Filled 2013-01-14: qty 16

## 2013-01-14 MED ORDER — IRINOTECAN HCL CHEMO INJECTION 100 MG/5ML: 180.0000 mg/m2 | Freq: Once | INTRAVENOUS | Status: DC

## 2013-01-14 MED ORDER — SODIUM CHLORIDE 0.9 % IV SOLN
2400.0000 mg/m2 | INTRAVENOUS | Status: DC
  Administered 2013-01-14: 4850 mg via INTRAVENOUS
  Filled 2013-01-14: qty 97

## 2013-01-14 MED ORDER — ONDANSETRON 16 MG/50ML IVPB (CHCC)
16.0000 mg | Freq: Once | INTRAVENOUS | Status: AC
  Administered 2013-01-14: 16 mg via INTRAVENOUS

## 2013-01-14 MED ORDER — IRINOTECAN HCL CHEMO INJECTION 100 MG/5ML
360.0000 mg | Freq: Once | INTRAVENOUS | Status: AC
  Administered 2013-01-14: 360 mg via INTRAVENOUS
  Filled 2013-01-14: qty 18

## 2013-01-14 NOTE — Telephone Encounter (Signed)
gv and printed appt sched and avs for pt....michelle added tx...  °

## 2013-01-14 NOTE — Telephone Encounter (Signed)
Per staff message and POF I have scheduled appts.  JMW  

## 2013-01-14 NOTE — Progress Notes (Signed)
Hematology and Oncology Follow Up Visit  Christopher Jimenez [id number] [birth year] 62 y.o. 12/03/2012 9:33 AM Christopher Jimenez Christopher Jimenez, MDRoss, [name], MD   Principle Diagnosis: A 62 year old gentleman diagnosed with a T3 N1 rectosigmoid adenocarcinoma. He had 1/14 lymph nodes involved diagnosed in December 2010. 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  Prior Therapy: 1. Underwent a lower anterior resection done in January 2012. 2. Underwent adjuvant radiation therapy with Xeloda concluded in March 2011. 3. Received adjuvant FOLFOX therapy concluded in July 2012. 4. The patient had involvement of his adrenal gland and underwent adrenalectomy on April 2012 under the care of Christopher Jimenez. 5. He is status post a microwave ablation of the hepatic metastasis done July 06, 2011.  Current therapy: On systemic chemotherapy with FOLFIRI/Avastin started on 07/21/12. Here for cycle 8 Jimenez.   Interim History:  Christopher Jimenez for a followup visit. 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 He is tolerating chemo well. He is not reporting any abdominal pain. His performance status activity level remains excellent. He does report some occasional fatigue at exertion but had not report any shortness of breath. Had not reported any cough, not associated with any hemoptysis or hematemesis. No fevers or chills. No new symptoms reported.  He had a month off due to his son's wedding and now ready to proceed with chemotherapy.    Medications: I have reviewed the patient's current medications. No current outpatient prescriptions on file.  Allergies: No Known Allergies  Past Medical History, Surgical history, Social history, and Family History were reviewed and updated.  Review of Systems: Constitutional:  Negative for  fever, chills, night sweats, anorexia, weight loss, pain. Cardiovascular: no chest pain or dyspnea on exertion Respiratory: no cough, shortness of breath, or wheezing Neurological: no TIA or stroke symptoms Dermatological: negative ENT: negative Skin: Negative. Gastrointestinal: no abdominal pain, change in bowel habits, or black or bloody stools Genito-Urinary: no dysuria, trouble voiding, or hematuria Hematological and Lymphatic: negative Breast: negative for breast lumps Musculoskeletal: negative Remaining ROS negative.  Physical Exam: Blood pressure 119/75, pulse 63, temperature 96.7 F (35.9 C), temperature source Oral, resp. rate 18, height 6' (1.829 m), weight 189 lb 8 oz (85.957 kg). ECOG: 0 General appearance: alert, cooperative and no distress Head: Normocephalic, without obvious abnormality, atraumatic Neck: no adenopathy, no carotid bruit, no JVD, supple, symmetrical, trachea midline and thyroid not enlarged, symmetric, no tenderness/mass/nodules Lymph nodes: Cervical, supraclavicular, and axillary nodes normal. Heart:regular rate and rhythm, S1, S2 normal, no murmur, click, rub or gallop Lung:chest clear, no wheezing, rales, normal symmetric air entry, no tachypnea, retractions or cyanosis Abdomen: soft, non-tender, without masses or organomegaly EXT:no erythema, induration, or nodules   Labs.  CBC    Component Value Date/Time   WBC 6.1 01/14/2013 0903   WBC 7.5 07/07/2011 0530   RBC 4.53 01/14/2013 0903   RBC 4.30 07/07/2011 0530   HGB 13.7 01/14/2013 0903   HGB 12.6* 07/07/2011 0530   HCT 40.6 01/14/2013 0903   HCT 37.7* 07/07/2011 0530   PLT 191 01/14/2013 0903   PLT 173 07/07/2011 0530   MCV 89.6 01/14/2013 0903   MCV 87.7 07/07/2011 0530   MCH 30.2 01/14/2013 0903   MCH 29.3 07/07/2011 0530   MCHC 33.7 01/14/2013 0903   MCHC 33.4 07/07/2011 0530   RDW 14.2 01/14/2013  0903   RDW 12.7 07/07/2011 0530   LYMPHSABS 1.2 01/14/2013 0903   LYMPHSABS 0.7 01/02/2011  0930   MONOABS 0.8 01/14/2013 0903   MONOABS 0.7 01/02/2011 0930   EOSABS 0.6* 01/14/2013 0903   EOSABS 0.2 01/02/2011 0930   BASOSABS 0.0 01/14/2013 0903   BASOSABS 0.0 01/02/2011 0930      Impression and Plan: This is a pleasant 62 year old gentleman with the following issues:  1. Stage IV colorectal cancer. 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 He had a liver metastasis that has been ablated. His CT scan from 09/2012 and showed stable disease. The plan is to proceed with Cycle 8 Jimenez and 9 in two weeks. He will get CT scan after that.  2. Neutropenia. Due to chemo. Resolved. 3. Port-A-Cath management. PAC in place for chemotherapy. 4. Follow-up. In 2 weeks.

## 2013-01-14 NOTE — Patient Instructions (Addendum)
Fluorouracil, 5-FU injection What is this medicine? FLUOROURACIL, 5-FU (flure oh YOOR a sil) is a chemotherapy drug. It slows the growth of cancer cells. This medicine is used to treat many types of cancer like breast cancer, colon or rectal cancer, pancreatic cancer, and stomach cancer. This medicine may be used for other purposes; ask your health care provider or pharmacist if you have questions. What should I tell my health care provider before I take this medicine? They need to know if you have any of these conditions: -blood disorders -dihydropyrimidine dehydrogenase (DPD) deficiency -infection (especially a virus infection such as chickenpox, cold sores, or herpes) -kidney disease -liver disease -malnourished, poor nutrition -recent or ongoing radiation therapy -an unusual or allergic reaction to fluorouracil, other chemotherapy, other medicines, foods, dyes, or preservatives -pregnant or trying to get pregnant -breast-feeding How should I use this medicine? This drug is given as an infusion or injection into a vein. It is administered in a hospital or clinic by a specially trained health care professional. Talk to your pediatrician regarding the use of this medicine in children. Special care may be needed. Overdosage: If you think you have taken too much of this medicine contact a poison control center or emergency room at once. NOTE: This medicine is only for you. Do not share this medicine with others. What if I miss a dose? It is important not to miss your dose. Call your doctor or health care professional if you are unable to keep an appointment. What may interact with this medicine? -allopurinol -cimetidine -dapsone -digoxin -hydroxyurea -leucovorin -levamisole -medicines for seizures like ethotoin, fosphenytoin, phenytoin -medicines to increase blood counts like filgrastim, pegfilgrastim, sargramostim -medicines that treat or prevent blood clots like warfarin,  enoxaparin, and dalteparin -methotrexate -metronidazole -pyrimethamine -some other chemotherapy drugs like busulfan, cisplatin, estramustine, vinblastine -trimethoprim -trimetrexate -vaccines Talk to your doctor or health care professional before taking any of these medicines: -acetaminophen -aspirin -ibuprofen -ketoprofen -naproxen This list may not describe all possible interactions. Give your health care provider a list of all the medicines, herbs, non-prescription drugs, or dietary supplements you use. Also tell them if you smoke, drink alcohol, or use illegal drugs. Some items may interact with your medicine. What should I watch for while using this medicine? Visit your doctor for checks on your progress. This drug may make you feel generally unwell. This is not uncommon, as chemotherapy can affect healthy cells as well as cancer cells. Report any side effects. Continue your course of treatment even though you feel ill unless your doctor tells you to stop. In some cases, you may be given additional medicines to help with side effects. Follow all directions for their use. Call your doctor or health care professional for advice if you get a fever, chills or sore throat, or other symptoms of a cold or flu. Do not treat yourself. This drug decreases your body's ability to fight infections. Try to avoid being around people who are sick. This medicine may increase your risk to bruise or bleed. Call your doctor or health care professional if you notice any unusual bleeding. Be careful brushing and flossing your teeth or using a toothpick because you may get an infection or bleed more easily. If you have any dental work done, tell your dentist you are receiving this medicine. Avoid taking products that contain aspirin, acetaminophen, ibuprofen, naproxen, or ketoprofen unless instructed by your doctor. These medicines may hide a fever. Do not become pregnant while taking this medicine. Women should    inform their doctor if they wish to become pregnant or think they might be pregnant. There is a potential for serious side effects to an unborn child. Talk to your health care professional or pharmacist for more information. Do not breast-feed an infant while taking this medicine. Men should inform their doctor if they wish to father a child. This medicine may lower sperm counts. Do not treat diarrhea with over the counter products. Contact your doctor if you have diarrhea that lasts more than 2 days or if it is severe and watery. This medicine can make you more sensitive to the sun. Keep out of the sun. If you cannot avoid being in the sun, wear protective clothing and use sunscreen. Do not use sun lamps or tanning beds/booths. What side effects may I notice from receiving this medicine? Side effects that you should report to your doctor or health care professional as soon as possible: -allergic reactions like skin rash, itching or hives, swelling of the face, lips, or tongue -low blood counts - this medicine may decrease the number of white blood cells, red blood cells and platelets. You may be at increased risk for infections and bleeding. -signs of infection - fever or chills, cough, sore throat, pain or difficulty passing urine -signs of decreased platelets or bleeding - bruising, pinpoint red spots on the skin, black, tarry stools, blood in the urine -signs of decreased red blood cells - unusually weak or tired, fainting spells, lightheadedness -breathing problems -changes in vision -chest pain -mouth sores -nausea and vomiting -pain, swelling, redness at site where injected -pain, tingling, numbness in the hands or feet -redness, swelling, or sores on hands or feet -stomach pain -unusual bleeding Side effects that usually do not require medical attention (report to your doctor or health care professional if they continue or are bothersome): -changes in finger or toe  nails -diarrhea -dry or itchy skin -hair loss -headache -loss of appetite -sensitivity of eyes to the light -stomach upset -unusually teary eyes This list may not describe all possible side effects. Call your doctor for medical advice about side effects. You may report side effects to FDA at 1-800-FDA-1088. Where should I keep my medicine? This drug is given in a hospital or clinic and will not be stored at home. NOTE: This sheet is a summary. It may not cover all possible information. If you have questions about this medicine, talk to your doctor, pharmacist, or health care provider.  2013, [employer]. (01/14/2008 1:53:16 PM) Leucovorin injection What is this medicine? LEUCOVORIN (loo koe VOR in) is used to prevent or treat the harmful effects of some medicines. This medicine is used to treat anemia caused by a low amount of folic acid in the body. It is also used with 5-fluorouracil (5-FU) to treat colon cancer. This medicine may be used for other purposes; ask your health care provider or pharmacist if you have questions. What should I tell my health care provider before I take this medicine? They need to know if you have any of these conditions: -anemia from low levels of vitamin B-12 in the blood -an unusual or allergic reaction to leucovorin, folic acid, other medicines, foods, dyes, or preservatives -pregnant or trying to get pregnant -breast-feeding How should I use this medicine? This medicine is for injection into a muscle or into a vein. It is given by a health care professional in a hospital or clinic setting. Talk to your pediatrician regarding the use of this medicine in children. Special  care may be needed. Overdosage: If you think you have taken too much of this medicine contact a poison control center or emergency room at once. NOTE: This medicine is only for you. Do not share this medicine with others. What if I miss a dose? This does not apply. What may  interact with this medicine? -capecitabine -fluorouracil -phenobarbital -phenytoin -primidone -trimethoprim-sulfamethoxazole This list may not describe all possible interactions. Give your health care provider a list of all the medicines, herbs, non-prescription drugs, or dietary supplements you use. Also tell them if you smoke, drink alcohol, or use illegal drugs. Some items may interact with your medicine. What should I watch for while using this medicine? Your condition will be monitored carefully while you are receiving this medicine. This medicine may increase the side effects of 5-fluorouracil, 5-FU. Tell your doctor or health care professional if you have diarrhea or mouth sores that do not get better or that get worse. What side effects may I notice from receiving this medicine? Side effects that you should report to your doctor or health care professional as soon as possible: -allergic reactions like skin rash, itching or hives, swelling of the face, lips, or tongue -breathing problems -fever, infection -mouth sores -unusual bleeding or bruising -unusually weak or tired Side effects that usually do not require medical attention (report to your doctor or health care professional if they continue or are bothersome): -constipation or diarrhea -loss of appetite -nausea, vomiting This list may not describe all possible side effects. Call your doctor for medical advice about side effects. You may report side effects to FDA at 1-800-FDA-1088. Where should I keep my medicine? This drug is given in a hospital or clinic and will not be stored at home. NOTE: This sheet is a summary. It may not cover all possible information. If you have questions about this medicine, talk to your doctor, pharmacist, or health care provider.  2012, [employer]. (03/16/2008 4:50:29 PM)Irinotecan injection What is this medicine? IRINOTECAN (ir in oh TEE kan ) is a chemotherapy drug. It is used to  treat colon and rectal cancer. This medicine may be used for other purposes; ask your health care provider or pharmacist if you have questions. What should I tell my health care provider before I take this medicine? They need to know if you have any of these conditions: -blood disorders -dehydration -diarrhea -infection (especially a virus infection such as chickenpox, cold sores, or herpes) -liver disease -low blood counts, like low white cell, platelet, or red cell counts -recent or ongoing radiation therapy -an unusual or allergic reaction to irinotecan, sorbitol, other chemotherapy, other medicines, foods, dyes, or preservatives -pregnant or trying to get pregnant -breast-feeding How should I use this medicine? This drug is given as an infusion into a vein. It is administered in a hospital or clinic by a specially trained health care professional. Talk to your pediatrician regarding the use of this medicine in children. Special care may be needed. Overdosage: If you think you have taken too much of this medicine contact a poison control center or emergency room at once. NOTE: This medicine is only for you. Do not share this medicine with others. What if I miss a dose? It is important not to miss your dose. Call your doctor or health care professional if you are unable to keep an appointment. What may interact with this medicine? Do not take this medicine with any of the following medications: -atazanavir -ketoconazole -St. John's Wort This medicine may  also interact with the following medications: -dexamethasone -diuretics -laxatives -medicines for seizures like carbamazepine, mephobarbital, phenobarbital, phenytoin, primidone -medicines to increase blood counts like filgrastim, pegfilgrastim, sargramostim -prochlorperazine -vaccines This list may not describe all possible interactions. Give your health care provider a list of all the medicines, herbs, non-prescription drugs, or  dietary supplements you use. Also tell them if you smoke, drink alcohol, or use illegal drugs. Some items may interact with your medicine. What should I watch for while using this medicine? Your condition will be monitored carefully while you are receiving this medicine. You will need important blood work done while you are taking this medicine. This drug may make you feel generally unwell. This is not uncommon, as chemotherapy can affect healthy cells as well as cancer cells. Report any side effects. Continue your course of treatment even though you feel ill unless your doctor tells you to stop. In some cases, you may be given additional medicines to help with side effects. Follow all directions for their use. You may get drowsy or dizzy. Do not drive, use machinery, or do anything that needs mental alertness until you know how this medicine affects you. Do not stand or sit up quickly, especially if you are an older patient. This reduces the risk of dizzy or fainting spells. Call your doctor or health care professional for advice if you get a fever, chills or sore throat, or other symptoms of a cold or flu. Do not treat yourself. This drug decreases your body's ability to fight infections. Try to avoid being around people who are sick. This medicine may increase your risk to bruise or bleed. Call your doctor or health care professional if you notice any unusual bleeding. Be careful brushing and flossing your teeth or using a toothpick because you may get an infection or bleed more easily. If you have any dental work done, tell your dentist you are receiving this medicine. Avoid taking products that contain aspirin, acetaminophen, ibuprofen, naproxen, or ketoprofen unless instructed by your doctor. These medicines may hide a fever. Do not become pregnant while taking this medicine. Women should inform their doctor if they wish to become pregnant or think they might be pregnant. There is a potential for  serious side effects to an unborn child. Talk to your health care professional or pharmacist for more information. Do not breast-feed an infant while taking this medicine. What side effects may I notice from receiving this medicine? Side effects that you should report to your doctor or health care professional as soon as possible: -allergic reactions like skin rash, itching or hives, swelling of the face, lips, or tongue -low blood counts - this medicine may decrease the number of white blood cells, red blood cells and platelets. You may be at increased risk for infections and bleeding. -signs of infection - fever or chills, cough, sore throat, pain or difficulty passing urine -signs of decreased platelets or bleeding - bruising, pinpoint red spots on the skin, black, tarry stools, blood in the urine -signs of decreased red blood cells - unusually weak or tired, fainting spells, lightheadedness -breathing problems -chest pain -diarrhea -feeling faint or lightheaded, falls -flushing, runny nose, sweating during infusion -mouth sores or pain -pain, swelling, redness or irritation where injected -pain, swelling, warmth in the leg -pain, tingling, numbness in the hands or feet -problems with balance, talking, walking -stomach cramps, pain -trouble passing urine or change in the amount of urine -vomiting as to be unable to hold down drinks or  food -yellowing of the eyes or skin Side effects that usually do not require medical attention (report to your doctor or health care professional if they continue or are bothersome): -constipation -hair loss -headache -loss of appetite -nausea, vomiting -stomach upset This list may not describe all possible side effects. Call your doctor for medical advice about side effects. You may report side effects to FDA at 1-800-FDA-1088. Where should I keep my medicine? This drug is given in a hospital or clinic and will not be stored at home. NOTE: This sheet  is a summary. It may not cover all possible information. If you have questions about this medicine, talk to your doctor, pharmacist, or health care provider.  2013, [employer]. (01/27/2008 4:29:12 PM) Bevacizumab injection What is this medicine? BEVACIZUMAB (be va SIZ yoo mab) is a chemotherapy drug. It targets a protein found in many cancer cell types, and halts cancer growth. This drug treats many cancers including non-small cell lung cancer, and colon or rectal cancer. It is usually given with other chemotherapy drugs. This medicine may be used for other purposes; ask your health care provider or pharmacist if you have questions. What should I tell my health care provider before I take this medicine? They need to know if you have any of these conditions: -blood clots -heart disease, including heart failure, heart attack, or chest pain (angina) -high blood pressure -infection (especially a virus infection such as chickenpox, cold sores, or herpes) -kidney disease -lung disease -prior chemotherapy with doxorubicin, daunorubicin, epirubicin, or other anthracycline type chemotherapy agents -recent or ongoing radiation therapy -recent surgery -stroke -an unusual or allergic reaction to bevacizumab, hamster proteins, mouse proteins, other medicines, foods, dyes, or preservatives -pregnant or trying to get pregnant -breast-feeding How should I use this medicine? This medicine is for infusion into a vein. It is given by a health care professional in a hospital or clinic setting. Talk to your pediatrician regarding the use of this medicine in children. Special care may be needed. Overdosage: If you think you have taken too much of this medicine contact a poison control center or emergency room at once. NOTE: This medicine is only for you. Do not share this medicine with others. What if I miss a dose? It is important not to miss your dose. Call your doctor or health care professional  if you are unable to keep an appointment. What may interact with this medicine? Interactions are not expected. This list may not describe all possible interactions. Give your health care provider a list of all the medicines, herbs, non-prescription drugs, or dietary supplements you use. Also tell them if you smoke, drink alcohol, or use illegal drugs. Some items may interact with your medicine. What should I watch for while using this medicine? Your condition will be monitored carefully while you are receiving this medicine. You will need important blood work and urine testing done while you are taking this medicine. During your treatment, let your health care professional know if you have any unusual symptoms, such as difficulty breathing. This medicine may rarely cause 'gastrointestinal perforation' (holes in the stomach, intestines or colon), a serious side effect requiring surgery to repair. This medicine should be started at least 28 days following major surgery and the site of the surgery should be totally healed. Check with your doctor before scheduling dental work or surgery while you are receiving this treatment. Talk to your doctor if you have recently had surgery or if you have a wound that has  not healed. Do not become pregnant while taking this medicine. Women should inform their doctor if they wish to become pregnant or think they might be pregnant. There is a potential for serious side effects to an unborn child. Talk to your health care professional or pharmacist for more information. Do not breast-feed an infant while taking this medicine. This medicine has caused ovarian failure in some women. This medicine may interfere with the ability to have a child. You should talk to your doctor or health care professional if you are concerned about your fertility. What side effects may I notice from receiving this medicine? Side effects that you should report to your doctor or health care  professional as soon as possible: -allergic reactions like skin rash, itching or hives, swelling of the face, lips, or tongue -signs of infection - fever or chills, cough, sore throat, pain or trouble passing urine -signs of decreased platelets or bleeding - bruising, pinpoint red spots on the skin, black, tarry stools, nosebleeds, blood in the urine -breathing problems -changes in vision -chest pain -confusion -jaw pain, especially after dental work -mouth sores -seizures -severe abdominal pain -severe headache -sudden numbness or weakness of the face, arm or leg -swelling of legs or ankles -symptoms of a stroke: change in mental awareness, inability to talk or move one side of the body (especially in patients with lung cancer) -trouble passing urine or change in the amount of urine -trouble speaking or understanding -trouble walking, dizziness, loss of balance or coordination Side effects that usually do not require medical attention (report to your doctor or health care professional if they continue or are bothersome): -constipation -diarrhea -dry skin -headache -loss of appetite -nausea, vomiting This list may not describe all possible side effects. Call your doctor for medical advice about side effects. You may report side effects to FDA at 1-800-FDA-1088. Where should I keep my medicine? This drug is given in a hospital or clinic and will not be stored at home. NOTE: This sheet is a summary. It may not cover all possible information. If you have questions about this medicine, talk to your doctor, pharmacist, or health care provider.  2013, [employer]. (08/11/2010 4:25:37 PM)

## 2013-01-16 ENCOUNTER — Ambulatory Visit (HOSPITAL_BASED_OUTPATIENT_CLINIC_OR_DEPARTMENT_OTHER): Payer: BC Managed Care – PPO

## 2013-01-16 VITALS — BP 132/92 | HR 61 | Temp 98.1°F

## 2013-01-16 DIAGNOSIS — C189 Malignant neoplasm of colon, unspecified: Secondary | ICD-10-CM

## 2013-01-16 DIAGNOSIS — C7970 Secondary malignant neoplasm of unspecified adrenal gland: Secondary | ICD-10-CM

## 2013-01-16 DIAGNOSIS — C787 Secondary malignant neoplasm of liver and intrahepatic bile duct: Secondary | ICD-10-CM

## 2013-01-16 DIAGNOSIS — C19 Malignant neoplasm of rectosigmoid junction: Secondary | ICD-10-CM

## 2013-01-16 MED ORDER — HEPARIN SOD (PORK) LOCK FLUSH 100 UNIT/ML IV SOLN
500.0000 [IU] | Freq: Once | INTRAVENOUS | Status: AC | PRN
  Administered 2013-01-16: 500 [IU]
  Filled 2013-01-16: qty 5

## 2013-01-16 MED ORDER — SODIUM CHLORIDE 0.9 % IJ SOLN
10.0000 mL | INTRAMUSCULAR | Status: DC | PRN
  Administered 2013-01-16: 10 mL
  Filled 2013-01-16: qty 10

## 2013-01-16 NOTE — Progress Notes (Signed)
Pt in for pump D/C, states he "passed out" this morning.  Pt states he was going to throw up, but felt this coming on and eased himself down.  Pt did not fall and states he did not have any apparent injury.  Pt denies H/A, dizziness, blurred vision, chest pain, or other symptoms.  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  Pt verbalizes understanding.

## 2013-01-28 ENCOUNTER — Ambulatory Visit: Payer: BC Managed Care – PPO

## 2013-01-28 ENCOUNTER — Other Ambulatory Visit (HOSPITAL_BASED_OUTPATIENT_CLINIC_OR_DEPARTMENT_OTHER): Payer: BC Managed Care – PPO | Admitting: Lab

## 2013-01-28 ENCOUNTER — Encounter: Payer: Self-pay | Admitting: Oncology

## 2013-01-28 ENCOUNTER — Telehealth: Payer: Self-pay | Admitting: Oncology

## 2013-01-28 ENCOUNTER — Ambulatory Visit (HOSPITAL_BASED_OUTPATIENT_CLINIC_OR_DEPARTMENT_OTHER): Payer: BC Managed Care – PPO | Admitting: Oncology

## 2013-01-28 VITALS — BP 117/57 | HR 70 | Temp 97.8°F | Resp 17 | Ht 73.0 in | Wt 189.1 lb

## 2013-01-28 DIAGNOSIS — C189 Malignant neoplasm of colon, unspecified: Secondary | ICD-10-CM

## 2013-01-28 LAB — CBC WITH DIFFERENTIAL/PLATELET
Basophils Absolute: 0 10*3/uL (ref 0.0–0.1)
Eosinophils Absolute: 0.2 10*3/uL (ref 0.0–0.5)
HGB: 12.6 g/dL — ABNORMAL LOW (ref 13.0–17.1)
LYMPH%: 33 % (ref 14.0–49.0)
MCH: 30.2 pg (ref 27.2–33.4)
MCV: 89 fL (ref 79.3–98.0)
MONO%: 11.2 % (ref 0.0–14.0)
NEUT#: 1.3 10*3/uL — ABNORMAL LOW (ref 1.5–6.5)
NEUT%: 47.8 % (ref 39.0–75.0)
Platelets: 179 10*3/uL (ref 140–400)

## 2013-01-28 NOTE — Progress Notes (Unsigned)
Treatment cancelled per Dr Clelia Croft.

## 2013-01-28 NOTE — Progress Notes (Signed)
Hematology and Oncology Follow Up Visit  Christopher Jimenez [id number] 1950/12/11 62 y.o. 01/28/2013 11:05 AM [name],Christopher Jimenez, MDRoss, Christopher Most, MD   Principle Diagnosis: A 62 year old gentleman diagnosed with a T3 N1 rectosigmoid adenocarcinoma. He had 1/14 lymph nodes involved diagnosed in December 2010. 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  Prior Therapy: 1. Underwent a lower anterior resection done in January 2012. 2. Underwent adjuvant radiation therapy with Xeloda concluded in March 2011. 3. Received adjuvant FOLFOX therapy concluded in July 2012. 4. The patient had involvement of his adrenal gland and underwent adrenalectomy on April 2012 under the care of Dr. Johna Sheriff. 5. He is status post a microwave ablation of the hepatic metastasis done July 06, 2011.  Current therapy: On systemic chemotherapy with FOLFIRI/Avastin started on 07/21/12. Here for cycle 9 today.  Interim History:  [name] presents today for a followup visit. 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 He is tolerating chemo well. He is not reporting any abdominal pain. His performance status activity level remains excellent. He does report some occasional fatigue at exertion but had not report any shortness of breath. Had not reported any cough, not associated with any hemoptysis or hematemesis. No fevers or chills. States that he passed out about 2 weeks ago while waling to the bathroom. No episodes since. No headaches, visual changes, or other neurological symptoms. States he had episodes like this as a child.  Medications: I have reviewed the patient's current medications. No current outpatient prescriptions on file.  Allergies: No Known Allergies  Past Medical History, Surgical history, Social history, and Family History  were reviewed and updated.  Review of Systems: Constitutional:  Negative for fever, chills, night sweats, anorexia, weight loss, pain. Cardiovascular: no chest pain or dyspnea on exertion Respiratory: no cough, shortness of breath, or wheezing Neurological: no TIA or stroke symptoms Dermatological: negative ENT: negative Skin: Negative. Gastrointestinal: no abdominal pain, change in bowel habits, or black or bloody stools Genito-Urinary: no dysuria, trouble voiding, or hematuria Hematological and Lymphatic: negative Breast: negative for breast lumps Musculoskeletal: negative Remaining ROS negative.  Physical Exam: Blood pressure 117/57, pulse 70, temperature 97.8 F (36.6 C), temperature source Oral, resp. rate 17, height 6\' 1"  (1.854 m), weight 189 lb 1.6 oz (85.775 kg). ECOG: 0 General appearance: alert, cooperative and no distress Head: Normocephalic, without obvious abnormality, atraumatic Neck: no adenopathy, no carotid bruit, no JVD, supple, symmetrical, trachea midline and thyroid not enlarged, symmetric, no tenderness/mass/nodules Lymph nodes: Cervical, supraclavicular, and axillary nodes normal. Heart:regular rate and rhythm, S1, S2 normal, no murmur, click, rub or gallop Lung:chest clear, no wheezing, rales, normal symmetric air entry, no tachypnea, retractions or cyanosis Abdomin: soft, non-tender, without masses or organomegaly EXT:no erythema, induration, or nodules   Lab Results: Lab Results  Component Value Date   WBC 2.8* 01/28/2013   HGB 12.6* 01/28/2013   HCT 37.1* 01/28/2013   MCV 89.0 01/28/2013   PLT 179 01/28/2013     Chemistry      Component Value Date/Time   NA 139 01/14/2013 0903   NA 142 02/19/2012 0910   NA 140 12/20/2011 1016   K 4.6 01/14/2013 0903   K 5.0* 02/19/2012 0910   K 4.5 12/20/2011 1016   CL 105 01/14/2013 0903   CL 99 02/19/2012 0910   CL 105 12/20/2011 1016  CO2 27 01/14/2013 0903   CO2 30 02/19/2012 0910   CO2 27 12/20/2011 1016   BUN 10.8  01/14/2013 0903   BUN 14 02/19/2012 0910   BUN 18 12/20/2011 1016   CREATININE 0.8 01/14/2013 0903   CREATININE 1.0 02/19/2012 0910   CREATININE 0.90 12/20/2011 1016      Component Value Date/Time   CALCIUM 9.2 01/14/2013 0903   CALCIUM 8.5 02/19/2012 0910   CALCIUM 9.0 12/20/2011 1016   ALKPHOS 51 01/14/2013 0903   ALKPHOS 53 02/19/2012 0910   ALKPHOS 59 12/20/2011 1016   AST 16 01/14/2013 0903   AST 21 02/19/2012 0910   AST 15 12/20/2011 1016   ALT 12 01/14/2013 0903   ALT 13 12/20/2011 1016   BILITOT 0.84 01/14/2013 0903   BILITOT 0.80 02/19/2012 0910   BILITOT 0.5 12/20/2011 1016     Impression and Plan: This is a pleasant 62 year old gentleman with the following issues:  1. Stage IV colorectal cancer. 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 He had a liver metastasis that has been ablated. His CT scan from 09/2012 and showed stable disease. He prefers not to proceed with chemo today since his daughter will be graduating this weekend. Will hold off on his treatment. He will get CT scan prior to his next visit.  2. Neutropenia. Due to chemo. Resolved.  3. Syncopal episode. Likely a vagal response. He was instructed to go to the ER for any neurological changes or chest pain.  4. Port-A-Cath management. PAC in place for chemotherapy.  5. Follow-up. In 2 weeks.   Spent more than half the time coordinating care.    [place], [place] 5/7/201411:05 AM

## 2013-02-09 ENCOUNTER — Ambulatory Visit (HOSPITAL_COMMUNITY)
Admission: RE | Admit: 2013-02-09 | Discharge: 2013-02-09 | Disposition: A | Discharge status: Home / Self Care | Payer: BC Managed Care – PPO | Source: Ambulatory Visit | Attending: Oncology | Admitting: Oncology

## 2013-02-09 DIAGNOSIS — C189 Malignant neoplasm of colon, unspecified: Secondary | ICD-10-CM | POA: Insufficient documentation

## 2013-02-09 DIAGNOSIS — J438 Other emphysema: Secondary | ICD-10-CM | POA: Insufficient documentation

## 2013-02-09 DIAGNOSIS — Z9221 Personal history of antineoplastic chemotherapy: Secondary | ICD-10-CM | POA: Insufficient documentation

## 2013-02-09 DIAGNOSIS — Z923 Personal history of irradiation: Secondary | ICD-10-CM | POA: Insufficient documentation

## 2013-02-09 DIAGNOSIS — C7800 Secondary malignant neoplasm of unspecified lung: Secondary | ICD-10-CM | POA: Insufficient documentation

## 2013-02-09 DIAGNOSIS — R599 Enlarged lymph nodes, unspecified: Secondary | ICD-10-CM | POA: Insufficient documentation

## 2013-02-09 DIAGNOSIS — N281 Cyst of kidney, acquired: Secondary | ICD-10-CM | POA: Insufficient documentation

## 2013-02-09 DIAGNOSIS — R918 Other nonspecific abnormal finding of lung field: Secondary | ICD-10-CM | POA: Insufficient documentation

## 2013-02-09 DIAGNOSIS — I2510 Atherosclerotic heart disease of native coronary artery without angina pectoris: Secondary | ICD-10-CM | POA: Insufficient documentation

## 2013-02-09 MED ORDER — IOHEXOL 300 MG/ML  SOLN
80.0000 mL | Freq: Once | INTRAMUSCULAR | Status: AC | PRN
  Administered 2013-02-09: 100 mL via INTRAVENOUS

## 2013-02-11 ENCOUNTER — Encounter: Payer: Self-pay | Admitting: Oncology

## 2013-02-11 ENCOUNTER — Telehealth: Payer: Self-pay | Admitting: Oncology

## 2013-02-11 ENCOUNTER — Ambulatory Visit (HOSPITAL_BASED_OUTPATIENT_CLINIC_OR_DEPARTMENT_OTHER): Payer: BC Managed Care – PPO | Admitting: Oncology

## 2013-02-11 ENCOUNTER — Ambulatory Visit (HOSPITAL_BASED_OUTPATIENT_CLINIC_OR_DEPARTMENT_OTHER): Payer: BC Managed Care – PPO

## 2013-02-11 ENCOUNTER — Other Ambulatory Visit (HOSPITAL_BASED_OUTPATIENT_CLINIC_OR_DEPARTMENT_OTHER): Payer: BC Managed Care – PPO | Admitting: Lab

## 2013-02-11 VITALS — BP 135/83 | HR 65 | Temp 97.7°F | Resp 18 | Ht 73.0 in | Wt 191.4 lb

## 2013-02-11 DIAGNOSIS — C787 Secondary malignant neoplasm of liver and intrahepatic bile duct: Secondary | ICD-10-CM

## 2013-02-11 DIAGNOSIS — C19 Malignant neoplasm of rectosigmoid junction: Secondary | ICD-10-CM

## 2013-02-11 DIAGNOSIS — Z5111 Encounter for antineoplastic chemotherapy: Secondary | ICD-10-CM

## 2013-02-11 DIAGNOSIS — R55 Syncope and collapse: Secondary | ICD-10-CM

## 2013-02-11 DIAGNOSIS — C189 Malignant neoplasm of colon, unspecified: Secondary | ICD-10-CM

## 2013-02-11 DIAGNOSIS — Z5112 Encounter for antineoplastic immunotherapy: Secondary | ICD-10-CM

## 2013-02-11 DIAGNOSIS — C7970 Secondary malignant neoplasm of unspecified adrenal gland: Secondary | ICD-10-CM

## 2013-02-11 LAB — COMPREHENSIVE METABOLIC PANEL (CC13)
Alkaline Phosphatase: 48 U/L (ref 40–150)
BUN: 14.1 mg/dL (ref 7.0–26.0)
Creatinine: 0.8 mg/dL (ref 0.7–1.3)
Glucose: 91 mg/dl (ref 70–99)
Total Bilirubin: 0.56 mg/dL (ref 0.20–1.20)

## 2013-02-11 LAB — CBC WITH DIFFERENTIAL/PLATELET
Basophils Absolute: 0.1 10*3/uL (ref 0.0–0.1)
EOS%: 2.3 % (ref 0.0–7.0)
Eosinophils Absolute: 0.2 10*3/uL (ref 0.0–0.5)
HGB: 13.6 g/dL (ref 13.0–17.1)
LYMPH%: 19.2 % (ref 14.0–49.0)
MCH: 30.3 pg (ref 27.2–33.4)
MCV: 89.5 fL (ref 79.3–98.0)
MONO%: 13.4 % (ref 0.0–14.0)
NEUT#: 4.2 10*3/uL (ref 1.5–6.5)
Platelets: 211 10*3/uL (ref 140–400)
RBC: 4.49 10*6/uL (ref 4.20–5.82)

## 2013-02-11 MED ORDER — SODIUM CHLORIDE 0.9 % IV SOLN
Freq: Once | INTRAVENOUS | Status: AC
  Administered 2013-02-11: 11:00:00 via INTRAVENOUS

## 2013-02-11 MED ORDER — ATROPINE SULFATE 0.4 MG/ML IJ SOLN
0.5000 mg | Freq: Once | INTRAMUSCULAR | Status: AC | PRN
  Administered 2013-02-11: 0.5 mg via INTRAVENOUS
  Filled 2013-02-11: qty 1.25

## 2013-02-11 MED ORDER — FLUOROURACIL CHEMO INJECTION 2.5 GM/50ML
400.0000 mg/m2 | Freq: Once | INTRAVENOUS | Status: AC
  Administered 2013-02-11: 800 mg via INTRAVENOUS
  Filled 2013-02-11: qty 16

## 2013-02-11 MED ORDER — ONDANSETRON 16 MG/50ML IVPB (CHCC)
16.0000 mg | Freq: Once | INTRAVENOUS | Status: AC
  Administered 2013-02-11: 16 mg via INTRAVENOUS

## 2013-02-11 MED ORDER — HEPARIN SOD (PORK) LOCK FLUSH 100 UNIT/ML IV SOLN
500.0000 [IU] | Freq: Once | INTRAVENOUS | Status: DC | PRN
  Filled 2013-02-11: qty 5

## 2013-02-11 MED ORDER — SODIUM CHLORIDE 0.9 % IJ SOLN
10.0000 mL | INTRAMUSCULAR | Status: DC | PRN
  Filled 2013-02-11: qty 10

## 2013-02-11 MED ORDER — DEXAMETHASONE SODIUM PHOSPHATE 20 MG/5ML IJ SOLN
20.0000 mg | Freq: Once | INTRAMUSCULAR | Status: AC
  Administered 2013-02-11: 20 mg via INTRAVENOUS

## 2013-02-11 MED ORDER — IRINOTECAN HCL CHEMO INJECTION 100 MG/5ML
180.0000 mg/m2 | Freq: Once | INTRAVENOUS | Status: AC
  Administered 2013-02-11: 364 mg via INTRAVENOUS
  Filled 2013-02-11: qty 18.2

## 2013-02-11 MED ORDER — SODIUM CHLORIDE 0.9 % IV SOLN
2400.0000 mg/m2 | INTRAVENOUS | Status: DC
  Administered 2013-02-11: 4850 mg via INTRAVENOUS
  Filled 2013-02-11: qty 97

## 2013-02-11 MED ORDER — SODIUM CHLORIDE 0.9 % IV SOLN
5.0000 mg/kg | Freq: Once | INTRAVENOUS | Status: AC
  Administered 2013-02-11: 450 mg via INTRAVENOUS
  Filled 2013-02-11: qty 18

## 2013-02-11 MED ORDER — LEUCOVORIN CALCIUM INJECTION 350 MG
400.0000 mg/m2 | Freq: Once | INTRAVENOUS | Status: AC
  Administered 2013-02-11: 808 mg via INTRAVENOUS
  Filled 2013-02-11: qty 40.4

## 2013-02-11 NOTE — Progress Notes (Signed)
Hematology and Oncology Follow Up Visit  Christopher Jimenez [id number] [birth year] 62 y.o. 02/11/2013 9:14 AM [name],Christopher Jimenez, MDRoss, Christopher Most, MD   Principle Diagnosis: A 62 year old gentleman diagnosed with a T3 N1 rectosigmoid adenocarcinoma. He had 1/14 lymph nodes involved diagnosed in December 2010. 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  Prior Therapy: 1. Underwent a lower anterior resection done in January 2012. 2. Underwent adjuvant radiation therapy with Xeloda concluded in March 2011. 3. Received adjuvant FOLFOX therapy concluded in July 2012. 4. The patient had involvement of his adrenal gland and underwent adrenalectomy on April 2012 under the care of Dr. Johna Sheriff. 5. He is status post a microwave ablation of the hepatic metastasis done July 06, 2011.  Current therapy: On systemic chemotherapy with FOLFIRI/Avastin started on 07/21/12. Here for cycle 9 today.  Interim History:  [name] presents today for a followup visit. 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 He is tolerating chemo well. He is not reporting any abdominal pain. His performance status activity level remains excellent. He does report some occasional fatigue at exertion but had not report any shortness of breath. Had not reported any cough, not associated with any hemoptysis or hematemesis. No fevers or chills. No headaches, visual changes, or other neurological symptoms. He skipped chemotherapy 2 weeks ago and ready today.   Medications: I have reviewed the patient's current medications. No current outpatient prescriptions on file.  Allergies: No Known Allergies  Past Medical History, Surgical history, Social history, and Family History were reviewed and updated.  Review of Systems: Constitutional:  Negative for fever,  chills, night sweats, anorexia, weight loss, pain. Cardiovascular: no chest pain or dyspnea on exertion Respiratory: no cough, shortness of breath, or wheezing Neurological: no TIA or stroke symptoms Dermatological: negative ENT: negative Skin: Negative. Gastrointestinal: no abdominal pain, change in bowel habits, or black or bloody stools Genito-Urinary: no dysuria, trouble voiding, or hematuria Hematological and Lymphatic: negative Breast: negative for breast lumps Musculoskeletal: negative Remaining ROS negative.  Physical Exam: Blood pressure 135/83, pulse 65, temperature 97.7 F (36.5 C), temperature source Oral, resp. rate 18, height 6\' 1"  (1.854 m), weight 191 lb 6.4 oz (86.818 kg). ECOG: 0 General appearance: alert, cooperative and no distress Head: Normocephalic, without obvious abnormality, atraumatic Neck: no adenopathy, no carotid bruit, no JVD, supple, symmetrical, trachea midline and thyroid not enlarged, symmetric, no tenderness/mass/nodules Lymph nodes: Cervical, supraclavicular, and axillary nodes normal. Heart:regular rate and rhythm, S1, S2 normal, no murmur, click, rub or gallop Lung:chest clear, no wheezing, rales, normal symmetric air entry, no tachypnea, retractions or cyanosis Abdomin: soft, non-tender, without masses or organomegaly EXT:no erythema, induration, or nodules   Lab Results: Lab Results  Component Value Date   WBC 6.5 02/11/2013   HGB 13.6 02/11/2013   HCT 40.2 02/11/2013   MCV 89.5 02/11/2013   PLT 211 02/11/2013     Chemistry      Component Value Date/Time   NA 139 01/14/2013 0903   NA 142 02/19/2012 0910   NA 140 12/20/2011 1016   K 4.6 01/14/2013 0903   K 5.0* 02/19/2012 0910   K 4.5 12/20/2011 1016   CL 105 01/14/2013 0903   CL 99 02/19/2012 0910   CL 105 12/20/2011 1016   CO2 27 01/14/2013 0903   CO2 30 02/19/2012 0910   CO2 27  12/20/2011 1016   BUN 10.8 01/14/2013 0903   BUN 14 02/19/2012 0910   BUN 18 12/20/2011 1016   CREATININE 0.8  01/14/2013 0903   CREATININE 1.0 02/19/2012 0910   CREATININE 0.90 12/20/2011 1016      Component Value Date/Time   CALCIUM 9.2 01/14/2013 0903   CALCIUM 8.5 02/19/2012 0910   CALCIUM 9.0 12/20/2011 1016   ALKPHOS 51 01/14/2013 0903   ALKPHOS 53 02/19/2012 0910   ALKPHOS 59 12/20/2011 1016   AST 16 01/14/2013 0903   AST 21 02/19/2012 0910   AST 15 12/20/2011 1016   ALT 12 01/14/2013 0903   ALT 13 12/20/2011 1016   BILITOT 0.84 01/14/2013 0903   BILITOT 0.80 02/19/2012 0910   BILITOT 0.5 12/20/2011 1016     CT CHEST  Findings: Approximately 15-20 pulmonary nodules throughout all  lobes. Dominant nodules/metastases have progressed, including:  --6 x 5 mm nodule in the right upper lobe (series 6/image 20),  previously 5 x 4 mm  --18 x 9 mm irregular nodule in the posterior right upper lobe  (series 6/image 38), previously 14 x 5 mm  --8 x 5 mm subpleural nodule in the lingula (series 6/image 65),  previously 6 x 4 mm  --5 mm cavitary nodule in the left lower lobe (series 6/image 66),  previously 4 mm  Mild paraseptal emphysematous changes. No pleural effusion or  pneumothorax.  Visualized thyroid is unremarkable.  The heart is normal in size. No pericardial effusion. Coronary  atherosclerosis.  Thoracic lymphadenopathy, including:  --6 mm short-axis high right paratracheal node (series 4/image 22),  previously 4 mm  --14 mm short-axis right paratracheal node (series 4/image 38),  previously 17 mm  --12 mm short-axis right hilar node (series 4/image 47), previously  8 mm  Calcified mediastinal and left hilar nodes related to prior  granulomatous disease.  Mild degenerative changes of the thoracic spine.  IMPRESSION:  Mild progression of pulmonary metastases, as described above.  Overall mild progression of thoracic lymphadenopathy.  CT ABDOMEN AND PELVIS  Findings: 3.3 x 1.6 cm hypoenhancing lesion in the anterior  segment right hepatic lobe (series 4/image 111), previously 3.3 x  1.8  cm, corresponding to the site of prior ablation. Associated  mild intrahepatic ductal dilatation seen in the inferior right  hepatic lobe (series 4/image 119).  Calcified splenic granulomata.  Pancreas and left adrenal gland are within normal limits. Prior  right adrenalectomy.  Gallbladder is unremarkable. No intrahepatic or extrahepatic  ductal dilatation.  Bilateral renal cysts, measuring up to 1.8 x 1.7 cm on the left and  1.6 x 2.2 cm on the right. No hydronephrosis.  No evidence of bowel obstruction. Normal appendix. Status post  low anterior resection with left lower quadrant colostomy and short  Hartmann's pouch.  Atherosclerotic calcifications of the abdominal aorta and branch  vessels.  No abdominopelvic ascites.  No suspicious abdominopelvic lymphadenopathy.  Stranding in the presacral region (series 4/image 180), likely  reflecting radiation changes.  Prostate is grossly unremarkable.  Bladder is mildly thick-walled although underdistended.  Mild degenerative changes of the lumbar spine.  IMPRESSION:  Status post low anterior resection with left lower quadrant  colostomy.  Status post right adrenalectomy.  Ablation zone in the anterior segment right hepatic lobe is stable  versus mildly decreased.  Otherwise, no evidence of metastatic disease in the abdomen/pelvis.    Impression and Plan: This is a pleasant 62 year old gentleman with the following issues:  1. Stage IV colorectal cancer. He  had an adrenal metastasis that was resected. He had a liver metastasis that has been ablated. His CT scan from 01/2013 reviewed and showed stable disease with slight progression. We will proceed with chemotherapy today and likely switch to maintenance chemotherapy after 12 cycles, sooner if needed 2. Neutropenia. Due to chemo. Resolved.  3. Syncopal episode. Likely a vagal response. He was instructed to go to the ER for any neurological changes or chest pain.  4. Port-A-Cath  management. PAC in place for chemotherapy.  5. Follow-up. In 2 weeks for cycle 10.      Kody Brandl 5/21/20149:14 AM

## 2013-02-11 NOTE — Progress Notes (Signed)
Checked patient's urine for protein, prior to avastin infusion and it was trace amount, notified brandi in pharmacy and patient's nurse, anita.

## 2013-02-11 NOTE — Progress Notes (Signed)
Put disability form on nurse's desk. °

## 2013-02-11 NOTE — Patient Instructions (Addendum)
[facility] Discharge Instructions for Patients Receiving Chemotherapy  Today you received the following chemotherapy agents : 5FU, Leucovorin, Camptosar, Avastin  To help prevent nausea and vomiting after your treatment, we encourage you to take your nausea medication as directed by your MD.  If you develop nausea and vomiting that is not controlled by your nausea medication, call the clinic. If it is after clinic hours your family physician or the after hours number for the clinic or go to the Emergency Department.   BELOW ARE SYMPTOMS THAT SHOULD BE REPORTED IMMEDIATELY:  *FEVER GREATER THAN 100.5 F  *CHILLS WITH OR WITHOUT FEVER  NAUSEA AND VOMITING THAT IS NOT CONTROLLED WITH YOUR NAUSEA MEDICATION  *UNUSUAL SHORTNESS OF BREATH  *UNUSUAL BRUISING OR BLEEDING  TENDERNESS IN MOUTH AND THROAT WITH OR WITHOUT PRESENCE OF ULCERS  *URINARY PROBLEMS  *BOWEL PROBLEMS  UNUSUAL RASH Items with * indicate a potential emergency and should be followed up as soon as possible.  Feel free to call the clinic you have any questions or concerns. The clinic phone number is [phone].

## 2013-02-13 ENCOUNTER — Ambulatory Visit (HOSPITAL_BASED_OUTPATIENT_CLINIC_OR_DEPARTMENT_OTHER): Payer: BC Managed Care – PPO

## 2013-02-13 VITALS — BP 116/81 | HR 65 | Temp 97.4°F

## 2013-02-13 DIAGNOSIS — C189 Malignant neoplasm of colon, unspecified: Secondary | ICD-10-CM

## 2013-02-13 DIAGNOSIS — C787 Secondary malignant neoplasm of liver and intrahepatic bile duct: Secondary | ICD-10-CM

## 2013-02-13 DIAGNOSIS — C801 Malignant (primary) neoplasm, unspecified: Secondary | ICD-10-CM

## 2013-02-13 MED ORDER — SODIUM CHLORIDE 0.9 % IJ SOLN
10.0000 mL | INTRAMUSCULAR | Status: DC | PRN
  Administered 2013-02-13: 10 mL
  Filled 2013-02-13: qty 10

## 2013-02-13 MED ORDER — HEPARIN SOD (PORK) LOCK FLUSH 100 UNIT/ML IV SOLN
500.0000 [IU] | Freq: Once | INTRAVENOUS | Status: AC | PRN
  Administered 2013-02-13: 500 [IU]
  Filled 2013-02-13: qty 5

## 2013-02-17 ENCOUNTER — Telehealth: Payer: Self-pay | Admitting: Emergency Medicine

## 2013-02-17 ENCOUNTER — Telehealth: Payer: Self-pay | Admitting: Interventional Radiology

## 2013-02-17 NOTE — Telephone Encounter (Signed)
S/w pt, and made him aware that his liver ablation site is stable, no new liver lesions and we will f/u with him again after his next set of scans. Pt was very happy and agreed.

## 2013-02-17 NOTE — Telephone Encounter (Signed)
Message copied by Jari Sportsman on Tue Feb 17, 2013  1:35 PM ------      Message from: Berdine Dance      Created: Tue Feb 17, 2013  1:09 PM      Regarding: RE: F/U- MAY 2014       Liver ablation site is stable.  No new liver lesions.  No further treatment at this time and continue chemo and surveillence imaging with [facility].            Please call and let him know this. And f/u with me after next round of CTs in 8/14 or 9/14            Thanks      Trevor                        ----- Message -----         From: Jari Sportsman, EMT         Sent: 02/11/2013   1:28 PM           To: Berdine Dance, MD      Subject: Annell Greening: F/U- MAY 2014                                        ANY F/U OR ADDITIONAL ABLATIONS NEEDED BY YOU??            TAMS      ----- Message -----         From: Jari Sportsman, EMT         Sent: 02/09/2013   2:31 PM           To: Jari Sportsman, EMT, Henri Ann Maki, RN      Subject: FW: F/U- MAY 2014                                        SEEING DR Clelia Croft 02-11-13            ----- Message -----         From: Jari Sportsman, EMT         Sent: 01/29/2013  12:32 PM           To: Jari Sportsman, EMT, Henri Ann Maki, RN      Subject: FW: F/U- MAY 2014                                        CT SCHE FOR 02-09-13 AT 800AM            ----- Message -----         From: Jari Sportsman, EMT         Sent: 11/04/2012   1:53 PM           To: Jari Sportsman, EMT, Henri Ann Maki, RN      Subject: F/U- MAY 2014                                            CK TO SEE IF DR SHADAD HAS ORDERED F/U IMAGING AND HAVE  DR [facility] TO REVIEW AND SEE IF AN ADDITIONAL ABLATION IS NEEDED.                         ------

## 2013-02-24 ENCOUNTER — Other Ambulatory Visit (HOSPITAL_BASED_OUTPATIENT_CLINIC_OR_DEPARTMENT_OTHER): Payer: BC Managed Care – PPO | Admitting: Lab

## 2013-02-24 ENCOUNTER — Telehealth: Payer: Self-pay | Admitting: Oncology

## 2013-02-24 ENCOUNTER — Other Ambulatory Visit: Payer: Self-pay | Admitting: *Deleted

## 2013-02-24 ENCOUNTER — Telehealth: Payer: Self-pay | Admitting: *Deleted

## 2013-02-24 ENCOUNTER — Ambulatory Visit (HOSPITAL_BASED_OUTPATIENT_CLINIC_OR_DEPARTMENT_OTHER): Payer: BC Managed Care – PPO | Admitting: Oncology

## 2013-02-24 VITALS — BP 127/82 | HR 71 | Temp 96.9°F | Resp 18 | Ht 73.0 in | Wt 187.0 lb

## 2013-02-24 DIAGNOSIS — C19 Malignant neoplasm of rectosigmoid junction: Secondary | ICD-10-CM

## 2013-02-24 DIAGNOSIS — C189 Malignant neoplasm of colon, unspecified: Secondary | ICD-10-CM

## 2013-02-24 DIAGNOSIS — C7970 Secondary malignant neoplasm of unspecified adrenal gland: Secondary | ICD-10-CM

## 2013-02-24 DIAGNOSIS — C787 Secondary malignant neoplasm of liver and intrahepatic bile duct: Secondary | ICD-10-CM

## 2013-02-24 DIAGNOSIS — R5381 Other malaise: Secondary | ICD-10-CM

## 2013-02-24 LAB — CBC WITH DIFFERENTIAL/PLATELET
Basophils Absolute: 0 10*3/uL (ref 0.0–0.1)
EOS%: 4.9 % (ref 0.0–7.0)
MCHC: 34.9 g/dL (ref 32.0–36.0)
MONO#: 0.5 10*3/uL (ref 0.1–0.9)
MONO%: 10.2 % (ref 0.0–14.0)
NEUT#: 2.8 10*3/uL (ref 1.5–6.5)
NEUT%: 59.2 % (ref 39.0–75.0)
Platelets: 157 10*3/uL (ref 140–400)
RBC: 4.3 10*6/uL (ref 4.20–5.82)
RDW: 14.4 % (ref 11.0–14.6)
WBC: 4.7 10*3/uL (ref 4.0–10.3)
lymph#: 1.2 10*3/uL (ref 0.9–3.3)

## 2013-02-24 LAB — COMPREHENSIVE METABOLIC PANEL (CC13)
AST: 17 U/L (ref 5–34)
Alkaline Phosphatase: 51 U/L (ref 40–150)
BUN: 13.8 mg/dL (ref 7.0–26.0)
Calcium: 9.2 mg/dL (ref 8.4–10.4)
Chloride: 105 mEq/L (ref 98–107)
Creatinine: 0.8 mg/dL (ref 0.7–1.3)

## 2013-02-24 NOTE — Telephone Encounter (Signed)
Per staff phone call and POF I have schedueld appts.  JMW  

## 2013-02-24 NOTE — Progress Notes (Signed)
Hematology and Oncology Follow Up Visit  Christopher Jimenez [id number] 08/16/1951 62 y.o. 02/24/2013 1:47 PM Christopher Jimenez Christopher Jimenez, MDRoss, [name], MD   Principle Diagnosis: A 62 year old gentleman diagnosed with a T3 N1 rectosigmoid adenocarcinoma. He had 1/14 lymph nodes involved diagnosed in December 2010. 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  Prior Therapy: 1. Underwent a lower anterior resection done in January 2012. 2. Underwent adjuvant radiation therapy with Xeloda concluded in March 2011. 3. Received adjuvant FOLFOX therapy concluded in July 2012. 4. The patient had involvement of his adrenal gland and underwent adrenalectomy on April 2012 under the care of Dr. Johna Jimenez. 5. He is status post a microwave ablation of the hepatic metastasis done July 06, 2011.  Current therapy: On systemic chemotherapy with FOLFIRI/Avastin started on 07/21/12. Here for cycle 10 evaluation  today.  Interim History:  Christopher Jimenez presents today for a followup visit. 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 He is tolerating chemo well. He is not reporting any abdominal pain. His performance status activity level remains excellent. He does report some occasional fatigue at exertion but had not report any shortness of breath. Had not reported any cough, not associated with any hemoptysis or hematemesis. No fevers or chills. No headaches, visual changes, or other neurological symptoms. No new complications at this time.   Medications: I have reviewed the patient's current medications. No current outpatient prescriptions on file.  Allergies: No Known Allergies  Past Medical History, Surgical history, Social history, and Family History were reviewed and updated.  Review of Systems: Constitutional:  Negative for fever,  chills, night sweats, anorexia, weight loss, pain. Cardiovascular: no chest pain or dyspnea on exertion Respiratory: no cough, shortness of breath, or wheezing Neurological: no TIA or stroke symptoms Dermatological: negative ENT: negative Skin: Negative. Gastrointestinal: no abdominal pain, change in bowel habits, or black or bloody stools Genito-Urinary: no dysuria, trouble voiding, or hematuria Hematological and Lymphatic: negative Breast: negative for breast lumps Musculoskeletal: negative Remaining ROS negative.  Physical Exam: Blood pressure 127/82, pulse 71, temperature 96.9 F (36.1 C), temperature source Oral, resp. rate 18, height 6\' 1"  (1.854 m), weight 187 lb (84.823 kg). ECOG: 0 General appearance: alert, cooperative and no distress Head: Normocephalic, without obvious abnormality, atraumatic Neck: no adenopathy, no carotid bruit, no JVD, supple, symmetrical, trachea midline and thyroid not enlarged, symmetric, no tenderness/mass/nodules Lymph nodes: Cervical, supraclavicular, and axillary nodes normal. Heart:regular rate and rhythm, S1, S2 normal, no murmur, click, rub or gallop Lung:chest clear, no wheezing, rales, normal symmetric air entry, no tachypnea, retractions or cyanosis Abdomin: soft, non-tender, without masses or organomegaly EXT:no erythema, induration, or nodules   Lab Results: Lab Results  Component Value Date   WBC 4.7 02/24/2013   HGB 13.2 02/24/2013   HCT 37.8* 02/24/2013   MCV 88.1 02/24/2013   PLT 157 02/24/2013     Chemistry      Component Value Date/Time   NA 140 02/11/2013 0836   NA 142 02/19/2012 0910   NA 140 12/20/2011 1016   K 4.3 02/11/2013 0836   K 5.0* 02/19/2012 0910   K 4.5 12/20/2011 1016   CL 106 02/11/2013 0836   CL 99 02/19/2012 0910   CL 105 12/20/2011 1016   CO2 25 02/11/2013 0836   CO2 30 02/19/2012 0910   CO2 27 12/20/2011 1016  BUN 14.1 02/11/2013 0836   BUN 14 02/19/2012 0910   BUN 18 12/20/2011 1016   CREATININE 0.8 02/11/2013 0836    CREATININE 1.0 02/19/2012 0910   CREATININE 0.90 12/20/2011 1016      Component Value Date/Time   CALCIUM 8.9 02/11/2013 0836   CALCIUM 8.5 02/19/2012 0910   CALCIUM 9.0 12/20/2011 1016   ALKPHOS 48 02/11/2013 0836   ALKPHOS 53 02/19/2012 0910   ALKPHOS 59 12/20/2011 1016   AST 18 02/11/2013 0836   AST 21 02/19/2012 0910   AST 15 12/20/2011 1016   ALT 15 02/11/2013 0836   ALT 13 12/20/2011 1016   BILITOT 0.56 02/11/2013 0836   BILITOT 0.80 02/19/2012 0910   BILITOT 0.5 12/20/2011 1016      Impression and Plan: This is a pleasant 62 year old gentleman with the following issues:  1. Stage IV colorectal cancer. 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 He had a liver metastasis that has been ablated. His CT scan from 01/2013 showed stable disease with slight progression. We will proceed with chemotherapy today and likely switch to maintenance chemotherapy after 12 cycles, sooner if needed 2. Neutropenia. Due to chemo. Resolved.  3. Syncopal episode. Likely a vagal response. Resolved now.  4. Port-A-Cath management. PAC in place for chemotherapy.  5. Follow-up. In 2 weeks for cycle 11.      Christopher Jimenez 6/3/20141:47 PM

## 2013-02-24 NOTE — Telephone Encounter (Signed)
Pt is aware of appt on July 2014 lab , MD and chemo

## 2013-02-24 NOTE — Telephone Encounter (Signed)
Gave pt appt for lab , chemo and MD on June , and July waiting for chemo from [facility]

## 2013-02-25 ENCOUNTER — Other Ambulatory Visit: Payer: BC Managed Care – PPO | Admitting: Lab

## 2013-02-25 ENCOUNTER — Ambulatory Visit: Payer: BC Managed Care – PPO | Admitting: Oncology

## 2013-02-25 ENCOUNTER — Ambulatory Visit (HOSPITAL_BASED_OUTPATIENT_CLINIC_OR_DEPARTMENT_OTHER): Payer: BC Managed Care – PPO

## 2013-02-25 VITALS — BP 126/74 | HR 62 | Temp 97.0°F | Resp 18

## 2013-02-25 DIAGNOSIS — C189 Malignant neoplasm of colon, unspecified: Secondary | ICD-10-CM

## 2013-02-25 DIAGNOSIS — C787 Secondary malignant neoplasm of liver and intrahepatic bile duct: Secondary | ICD-10-CM

## 2013-02-25 DIAGNOSIS — Z5111 Encounter for antineoplastic chemotherapy: Secondary | ICD-10-CM

## 2013-02-25 DIAGNOSIS — Z5112 Encounter for antineoplastic immunotherapy: Secondary | ICD-10-CM

## 2013-02-25 DIAGNOSIS — C19 Malignant neoplasm of rectosigmoid junction: Secondary | ICD-10-CM

## 2013-02-25 MED ORDER — DEXAMETHASONE SODIUM PHOSPHATE 20 MG/5ML IJ SOLN
20.0000 mg | Freq: Once | INTRAMUSCULAR | Status: AC
  Administered 2013-02-25: 20 mg via INTRAVENOUS

## 2013-02-25 MED ORDER — IRINOTECAN HCL CHEMO INJECTION 100 MG/5ML
180.0000 mg/m2 | Freq: Once | INTRAVENOUS | Status: AC
  Administered 2013-02-25: 364 mg via INTRAVENOUS
  Filled 2013-02-25: qty 18.2

## 2013-02-25 MED ORDER — FLUOROURACIL CHEMO INJECTION 2.5 GM/50ML
400.0000 mg/m2 | Freq: Once | INTRAVENOUS | Status: AC
  Administered 2013-02-25: 800 mg via INTRAVENOUS
  Filled 2013-02-25: qty 16

## 2013-02-25 MED ORDER — SODIUM CHLORIDE 0.9 % IV SOLN
Freq: Once | INTRAVENOUS | Status: AC
  Administered 2013-02-25: 13:00:00 via INTRAVENOUS

## 2013-02-25 MED ORDER — SODIUM CHLORIDE 0.9 % IV SOLN
2400.0000 mg/m2 | INTRAVENOUS | Status: DC
  Administered 2013-02-25: 4850 mg via INTRAVENOUS
  Filled 2013-02-25: qty 97

## 2013-02-25 MED ORDER — ONDANSETRON 16 MG/50ML IVPB (CHCC)
16.0000 mg | Freq: Once | INTRAVENOUS | Status: AC
  Administered 2013-02-25: 16 mg via INTRAVENOUS

## 2013-02-25 MED ORDER — ATROPINE SULFATE 0.4 MG/ML IJ SOLN
0.5000 mg | Freq: Once | INTRAMUSCULAR | Status: AC | PRN
  Administered 2013-02-25: 0.5 mg via INTRAVENOUS
  Filled 2013-02-25: qty 1.25

## 2013-02-25 MED ORDER — LEUCOVORIN CALCIUM INJECTION 350 MG
400.0000 mg/m2 | Freq: Once | INTRAVENOUS | Status: AC
  Administered 2013-02-25: 800 mg via INTRAVENOUS
  Filled 2013-02-25: qty 40

## 2013-02-25 MED ORDER — SODIUM CHLORIDE 0.9 % IV SOLN
5.0000 mg/kg | Freq: Once | INTRAVENOUS | Status: AC
  Administered 2013-02-25: 450 mg via INTRAVENOUS
  Filled 2013-02-25: qty 18

## 2013-02-25 NOTE — Patient Instructions (Addendum)
[facility] Discharge Instructions for Patients Receiving Chemotherapy  Today you received the following chemotherapy agents :  Avastin, Camptosar, Leucovorin, 5FU.  To help prevent nausea and vomiting after your treatment, we encourage you to take your nausea medication as instructed by your physician.   If you develop nausea and vomiting that is not controlled by your nausea medication, call the clinic.   BELOW ARE SYMPTOMS THAT SHOULD BE REPORTED IMMEDIATELY:  *FEVER GREATER THAN 100.5 F  *CHILLS WITH OR WITHOUT FEVER  NAUSEA AND VOMITING THAT IS NOT CONTROLLED WITH YOUR NAUSEA MEDICATION  *UNUSUAL SHORTNESS OF BREATH  *UNUSUAL BRUISING OR BLEEDING  TENDERNESS IN MOUTH AND THROAT WITH OR WITHOUT PRESENCE OF ULCERS  *URINARY PROBLEMS  *BOWEL PROBLEMS  UNUSUAL RASH Items with * indicate a potential emergency and should be followed up as soon as possible.  Feel free to call the clinic you have any questions or concerns. The clinic phone number is [phone].    

## 2013-02-27 ENCOUNTER — Ambulatory Visit (HOSPITAL_BASED_OUTPATIENT_CLINIC_OR_DEPARTMENT_OTHER): Payer: BC Managed Care – PPO

## 2013-02-27 VITALS — BP 114/78 | HR 64 | Temp 98.0°F | Resp 20

## 2013-02-27 DIAGNOSIS — Z452 Encounter for adjustment and management of vascular access device: Secondary | ICD-10-CM

## 2013-02-27 DIAGNOSIS — C189 Malignant neoplasm of colon, unspecified: Secondary | ICD-10-CM

## 2013-02-27 DIAGNOSIS — C19 Malignant neoplasm of rectosigmoid junction: Secondary | ICD-10-CM

## 2013-02-27 DIAGNOSIS — C787 Secondary malignant neoplasm of liver and intrahepatic bile duct: Secondary | ICD-10-CM

## 2013-02-27 MED ORDER — SODIUM CHLORIDE 0.9 % IJ SOLN
10.0000 mL | INTRAMUSCULAR | Status: DC | PRN
  Administered 2013-02-27: 10 mL
  Filled 2013-02-27: qty 10

## 2013-02-27 MED ORDER — HEPARIN SOD (PORK) LOCK FLUSH 100 UNIT/ML IV SOLN
500.0000 [IU] | Freq: Once | INTRAVENOUS | Status: AC | PRN
  Administered 2013-02-27: 500 [IU]
  Filled 2013-02-27: qty 5

## 2013-03-10 ENCOUNTER — Ambulatory Visit: Payer: BC Managed Care – PPO | Admitting: Oncology

## 2013-03-10 ENCOUNTER — Other Ambulatory Visit: Payer: BC Managed Care – PPO | Admitting: Lab

## 2013-03-11 ENCOUNTER — Ambulatory Visit (HOSPITAL_BASED_OUTPATIENT_CLINIC_OR_DEPARTMENT_OTHER): Payer: BC Managed Care – PPO | Admitting: Oncology

## 2013-03-11 ENCOUNTER — Encounter: Payer: Self-pay | Admitting: Oncology

## 2013-03-11 ENCOUNTER — Ambulatory Visit: Payer: BC Managed Care – PPO

## 2013-03-11 ENCOUNTER — Other Ambulatory Visit (HOSPITAL_BASED_OUTPATIENT_CLINIC_OR_DEPARTMENT_OTHER): Payer: BC Managed Care – PPO | Admitting: Lab

## 2013-03-11 VITALS — BP 131/88 | HR 60 | Temp 97.5°F | Resp 18 | Ht 73.0 in | Wt 186.7 lb

## 2013-03-11 DIAGNOSIS — C787 Secondary malignant neoplasm of liver and intrahepatic bile duct: Secondary | ICD-10-CM

## 2013-03-11 DIAGNOSIS — C19 Malignant neoplasm of rectosigmoid junction: Secondary | ICD-10-CM

## 2013-03-11 DIAGNOSIS — C189 Malignant neoplasm of colon, unspecified: Secondary | ICD-10-CM

## 2013-03-11 LAB — CBC WITH DIFFERENTIAL/PLATELET
Basophils Absolute: 0 10*3/uL (ref 0.0–0.1)
EOS%: 5.3 % (ref 0.0–7.0)
Eosinophils Absolute: 0.1 10*3/uL (ref 0.0–0.5)
LYMPH%: 35.4 % (ref 14.0–49.0)
MCH: 29.7 pg (ref 27.2–33.4)
MCV: 88.7 fL (ref 79.3–98.0)
MONO%: 18.6 % — ABNORMAL HIGH (ref 0.0–14.0)
NEUT#: 0.9 10*3/uL — ABNORMAL LOW (ref 1.5–6.5)
Platelets: 179 10*3/uL (ref 140–400)
RBC: 4.17 10*6/uL — ABNORMAL LOW (ref 4.20–5.82)
RDW: 14.1 % (ref 11.0–14.6)
nRBC: 0 % (ref 0–0)

## 2013-03-11 NOTE — Progress Notes (Signed)
Hematology and Oncology Follow Up Visit  Christopher Jimenez [id number] August 13, 1951 62 y.o. 03/11/2013 1:30 PM Christopher Jimenez [name], MDRoss, Christopher Most, MD   Principle Diagnosis: A 62 year old gentleman diagnosed with a T3 N1 rectosigmoid adenocarcinoma. He had 1/14 lymph nodes involved diagnosed in December 2010. 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  Prior Therapy: 1. Underwent a lower anterior resection done in January 2012. 2. Underwent adjuvant radiation therapy with Xeloda concluded in March 2011. 3. Received adjuvant FOLFOX therapy concluded in July 2012. 4. The patient had involvement of his adrenal gland and underwent adrenalectomy on April 2012 under the care of Dr. Johna Sheriff. 5. He is status post a microwave ablation of the hepatic metastasis done July 06, 2011.  Current therapy: On systemic chemotherapy with FOLFIRI/Avastin started on 07/21/12. Here for cycle 10 evaluation  today.  Interim History:  Christopher Jimenez presents today for a followup visit. 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 He is tolerating chemo well. He is not reporting any abdominal pain. His performance status activity level remains excellent. He does report some occasional fatigue at exertion but had not report any shortness of breath. Had not reported any cough, not associated with any hemoptysis or hematemesis. No fevers or chills. No headaches, visual changes, or other neurological symptoms. No new complications at this time.   Medications: I have reviewed the patient's current medications. No current outpatient prescriptions on file.  Allergies: No Known Allergies  Past Medical History, Surgical history, Social history, and Family History were reviewed and updated.  Review of Systems: Constitutional:  Negative for fever,  chills, night sweats, anorexia, weight loss, pain. Cardiovascular: no chest pain or dyspnea on exertion Respiratory: no cough, shortness of breath, or wheezing Neurological: no TIA or stroke symptoms Dermatological: negative ENT: negative Skin: Negative. Gastrointestinal: no abdominal pain, change in bowel habits, or black or bloody stools Genito-Urinary: no dysuria, trouble voiding, or hematuria Hematological and Lymphatic: negative Breast: negative for breast lumps Musculoskeletal: negative Remaining ROS negative.  Physical Exam: Blood pressure 131/88, pulse 60, temperature 97.5 F (36.4 C), temperature source Oral, resp. rate 18, height 6\' 1"  (1.854 m), weight 186 lb 11.2 oz (84.687 kg). ECOG: 0 General appearance: alert, cooperative and no distress Head: Normocephalic, without obvious abnormality, atraumatic Neck: no adenopathy, no carotid bruit, no JVD, supple, symmetrical, trachea midline and thyroid not enlarged, symmetric, no tenderness/mass/nodules Lymph nodes: Cervical, supraclavicular, and axillary nodes normal. Heart:regular rate and rhythm, S1, S2 normal, no murmur, click, rub or gallop Lung:chest clear, no wheezing, rales, normal symmetric air entry, no tachypnea, retractions or cyanosis Abdomen: soft, non-tender, without masses or organomegaly EXT:no erythema, induration, or nodules   Lab Results: Lab Results  Component Value Date   WBC 2.3* 03/11/2013   HGB 12.4* 03/11/2013   HCT 37.0* 03/11/2013   MCV 88.7 03/11/2013   PLT 179 03/11/2013     Chemistry      Component Value Date/Time   NA 139 02/24/2013 1317   NA 142 02/19/2012 0910   NA 140 12/20/2011 1016   K 3.9 02/24/2013 1317   K 5.0* 02/19/2012 0910   K 4.5 12/20/2011 1016   CL 105 02/24/2013 1317   CL 99 02/19/2012 0910   CL 105 12/20/2011 1016   CO2 22 02/24/2013 1317   CO2 30 02/19/2012 0910   CO2 27 12/20/2011  1016   BUN 13.8 02/24/2013 1317   BUN 14 02/19/2012 0910   BUN 18 12/20/2011 1016   CREATININE 0.8  02/24/2013 1317   CREATININE 1.0 02/19/2012 0910   CREATININE 0.90 12/20/2011 1016      Component Value Date/Time   CALCIUM 9.2 02/24/2013 1317   CALCIUM 8.5 02/19/2012 0910   CALCIUM 9.0 12/20/2011 1016   ALKPHOS 51 02/24/2013 1317   ALKPHOS 53 02/19/2012 0910   ALKPHOS 59 12/20/2011 1016   AST 17 02/24/2013 1317   AST 21 02/19/2012 0910   AST 15 12/20/2011 1016   ALT 16 02/24/2013 1317   ALT 13 12/20/2011 1016   BILITOT 0.88 02/24/2013 1317   BILITOT 0.80 02/19/2012 0910   BILITOT 0.5 12/20/2011 1016      Impression and Plan: This is a pleasant 62 year old gentleman with the following issues:  1. Stage IV colorectal cancer. 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 He had a liver metastasis that has been ablated. His CT scan from 01/2013 showed stable disease with slight progression. Will hold chemo today due to neutropenia. We will likely switch to maintenance chemotherapy after 12 cycles, sooner if needed 2. Neutropenia. Due to chemo. ANC is 0.9 and will hold chemo today. I have cautioned him about neutropenic precautions. He is to call for temp >100.5.  3. Syncopal episode. Likely a vagal response. Resolved now.  4. Port-A-Cath management. PAC in place for chemotherapy.  5. Follow-up. In 2 weeks.      Clenton Pare 6/18/20141:30 PM

## 2013-03-31 ENCOUNTER — Other Ambulatory Visit (HOSPITAL_BASED_OUTPATIENT_CLINIC_OR_DEPARTMENT_OTHER): Payer: BC Managed Care – PPO | Admitting: Lab

## 2013-03-31 ENCOUNTER — Ambulatory Visit (HOSPITAL_BASED_OUTPATIENT_CLINIC_OR_DEPARTMENT_OTHER): Payer: BC Managed Care – PPO | Admitting: Oncology

## 2013-03-31 ENCOUNTER — Ambulatory Visit (HOSPITAL_BASED_OUTPATIENT_CLINIC_OR_DEPARTMENT_OTHER): Payer: BC Managed Care – PPO

## 2013-03-31 VITALS — BP 105/67 | HR 70 | Temp 97.4°F | Resp 18 | Ht 73.0 in | Wt 186.0 lb

## 2013-03-31 DIAGNOSIS — C189 Malignant neoplasm of colon, unspecified: Secondary | ICD-10-CM

## 2013-03-31 DIAGNOSIS — C787 Secondary malignant neoplasm of liver and intrahepatic bile duct: Secondary | ICD-10-CM

## 2013-03-31 DIAGNOSIS — Z5111 Encounter for antineoplastic chemotherapy: Secondary | ICD-10-CM

## 2013-03-31 DIAGNOSIS — C19 Malignant neoplasm of rectosigmoid junction: Secondary | ICD-10-CM

## 2013-03-31 DIAGNOSIS — C7970 Secondary malignant neoplasm of unspecified adrenal gland: Secondary | ICD-10-CM

## 2013-03-31 LAB — CBC WITH DIFFERENTIAL/PLATELET
BASO%: 0.5 % (ref 0.0–2.0)
EOS%: 4.6 % (ref 0.0–7.0)
HCT: 39 % (ref 38.4–49.9)
LYMPH%: 19.7 % (ref 14.0–49.0)
MCH: 30.3 pg (ref 27.2–33.4)
MCHC: 33.8 g/dL (ref 32.0–36.0)
NEUT%: 61.6 % (ref 39.0–75.0)
Platelets: 200 10*3/uL (ref 140–400)
RBC: 4.35 10*6/uL (ref 4.20–5.82)
nRBC: 0 % (ref 0–0)

## 2013-03-31 LAB — COMPREHENSIVE METABOLIC PANEL (CC13)
Albumin: 3.9 g/dL (ref 3.5–5.0)
Alkaline Phosphatase: 41 U/L (ref 40–150)
BUN: 17.2 mg/dL (ref 7.0–26.0)
CO2: 28 mEq/L (ref 22–29)
Calcium: 9.6 mg/dL (ref 8.4–10.4)
Chloride: 105 mEq/L (ref 98–109)
Glucose: 87 mg/dl (ref 70–140)
Potassium: 4 mEq/L (ref 3.5–5.1)
Sodium: 139 mEq/L (ref 136–145)
Total Protein: 6.8 g/dL (ref 6.4–8.3)

## 2013-03-31 LAB — POCT UA - GLUCOSE/PROTEIN

## 2013-03-31 LAB — CEA: CEA: 13.8 ng/mL — ABNORMAL HIGH (ref 0.0–5.0)

## 2013-03-31 MED ORDER — SODIUM CHLORIDE 0.9 % IV SOLN
Freq: Once | INTRAVENOUS | Status: AC
  Administered 2013-03-31: 10:00:00 via INTRAVENOUS

## 2013-03-31 MED ORDER — IRINOTECAN HCL CHEMO INJECTION 100 MG/5ML
180.0000 mg/m2 | Freq: Once | INTRAVENOUS | Status: AC
  Administered 2013-03-31: 364 mg via INTRAVENOUS
  Filled 2013-03-31: qty 18.2

## 2013-03-31 MED ORDER — DEXAMETHASONE SODIUM PHOSPHATE 20 MG/5ML IJ SOLN
20.0000 mg | Freq: Once | INTRAMUSCULAR | Status: AC
  Administered 2013-03-31: 20 mg via INTRAVENOUS

## 2013-03-31 MED ORDER — ONDANSETRON 16 MG/50ML IVPB (CHCC)
16.0000 mg | Freq: Once | INTRAVENOUS | Status: AC
  Administered 2013-03-31: 16 mg via INTRAVENOUS

## 2013-03-31 MED ORDER — SODIUM CHLORIDE 0.9 % IV SOLN
5.0000 mg/kg | Freq: Once | INTRAVENOUS | Status: AC
  Administered 2013-03-31: 450 mg via INTRAVENOUS
  Filled 2013-03-31: qty 18

## 2013-03-31 MED ORDER — SODIUM CHLORIDE 0.9 % IV SOLN
2400.0000 mg/m2 | INTRAVENOUS | Status: DC
  Administered 2013-03-31: 4850 mg via INTRAVENOUS
  Filled 2013-03-31: qty 97

## 2013-03-31 MED ORDER — ATROPINE SULFATE 0.4 MG/ML IJ SOLN
0.5000 mg | Freq: Once | INTRAMUSCULAR | Status: AC | PRN
  Administered 2013-03-31: 0.5 mg via INTRAVENOUS
  Filled 2013-03-31: qty 1.25

## 2013-03-31 MED ORDER — FLUOROURACIL CHEMO INJECTION 2.5 GM/50ML
400.0000 mg/m2 | Freq: Once | INTRAVENOUS | Status: AC
  Administered 2013-03-31: 800 mg via INTRAVENOUS
  Filled 2013-03-31: qty 16

## 2013-03-31 MED ORDER — LEUCOVORIN CALCIUM INJECTION 350 MG
400.0000 mg/m2 | Freq: Once | INTRAVENOUS | Status: AC
  Administered 2013-03-31: 808 mg via INTRAVENOUS
  Filled 2013-03-31: qty 40.4

## 2013-03-31 NOTE — Patient Instructions (Signed)
[facility] Discharge Instructions for Patients Receiving Chemotherapy  Today you received the following chemotherapy agents :  Avastin, Camptosar, Leucovorin, 5FU.  To help prevent nausea and vomiting after your treatment, we encourage you to take your nausea medication as instructed by your physician.   If you develop nausea and vomiting that is not controlled by your nausea medication, call the clinic.   BELOW ARE SYMPTOMS THAT SHOULD BE REPORTED IMMEDIATELY:  *FEVER GREATER THAN 100.5 F  *CHILLS WITH OR WITHOUT FEVER  NAUSEA AND VOMITING THAT IS NOT CONTROLLED WITH YOUR NAUSEA MEDICATION  *UNUSUAL SHORTNESS OF BREATH  *UNUSUAL BRUISING OR BLEEDING  TENDERNESS IN MOUTH AND THROAT WITH OR WITHOUT PRESENCE OF ULCERS  *URINARY PROBLEMS  *BOWEL PROBLEMS  UNUSUAL RASH Items with * indicate a potential emergency and should be followed up as soon as possible.  Feel free to call the clinic you have any questions or concerns. The clinic phone number is [phone].

## 2013-03-31 NOTE — Progress Notes (Signed)
Hematology and Oncology Follow Up Visit  Christopher Jimenez [id number] Feb 23, 1951 62 y.o. 03/31/2013 9:54 AM Christopher Jimenez Christopher Jimenez, MDRoss, [name], MD   Principle Diagnosis: A 62 year old gentleman diagnosed with a T3 N1 rectosigmoid adenocarcinoma. He had 1/14 lymph nodes involved diagnosed in December 2010. 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  Prior Therapy: 1. Underwent a lower anterior resection done in January 2012. 2. Underwent adjuvant radiation therapy with Xeloda concluded in March 2011. 3. Received adjuvant FOLFOX therapy concluded in July 2012. 4. The patient had involvement of his adrenal gland and underwent adrenalectomy on April 2012 under the care of Dr. Johna Sheriff. 5. He is status post a microwave ablation of the hepatic metastasis done July 06, 2011.  Current therapy: On systemic chemotherapy with FOLFIRI/Avastin started on 07/21/12. Here for cycle 10 evaluation  today.  Interim History:  Christopher Jimenez presents today for a followup visit. 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 He is tolerating chemo well. He is not reporting any abdominal pain. His performance status activity level remains excellent. He does report some occasional fatigue at exertion but had not report any shortness of breath. Had not reported any cough, not associated with any hemoptysis or hematemesis. No fevers or chills. No headaches, visual changes, or other neurological symptoms. No new complications at this time. Chemotherapy was held 2 weeks ago.   Medications: I have reviewed the patient's current medications. No current outpatient prescriptions on file.  Allergies: No Known Allergies  Past Medical History, Surgical history, Social history, and Family History were reviewed and updated.  Review of  Systems: Constitutional:  Negative for fever, chills, night sweats, anorexia, weight loss, pain. Cardiovascular: no chest pain or dyspnea on exertion Respiratory: no cough, shortness of breath, or wheezing Neurological: no TIA or stroke symptoms Dermatological: negative ENT: negative Skin: Negative. Gastrointestinal: no abdominal pain, change in bowel habits, or black or bloody stools Genito-Urinary: no dysuria, trouble voiding, or hematuria Hematological and Lymphatic: negative Breast: negative for breast lumps Musculoskeletal: negative Remaining ROS negative.  Physical Exam: Blood pressure 105/67, pulse 70, temperature 97.4 F (36.3 C), temperature source Oral, resp. rate 18, height 6\' 1"  (1.854 m), weight 186 lb (84.369 kg). ECOG: 0 General appearance: alert, cooperative and no distress Head: Normocephalic, without obvious abnormality, atraumatic Neck: no adenopathy, no carotid bruit, no JVD, supple, symmetrical, trachea midline and thyroid not enlarged, symmetric, no tenderness/mass/nodules Lymph nodes: Cervical, supraclavicular, and axillary nodes normal. Heart:regular rate and rhythm, S1, S2 normal, no murmur, click, rub or gallop Lung:chest clear, no wheezing, rales, normal symmetric air entry, no tachypnea, retractions or cyanosis Abdomen: soft, non-tender, without masses or organomegaly EXT:no erythema, induration, or nodules   Lab Results: Lab Results  Component Value Date   WBC 6.3 03/31/2013   HGB 13.2 03/31/2013   HCT 39.0 03/31/2013   MCV 89.7 03/31/2013   PLT 200 03/31/2013     Chemistry      Component Value Date/Time   NA 139 02/24/2013 1317   NA 142 02/19/2012 0910   NA 140 12/20/2011 1016   K 3.9 02/24/2013 1317   K 5.0* 02/19/2012 0910   K 4.5 12/20/2011 1016   CL 105 02/24/2013 1317   CL 99 02/19/2012 0910   CL 105 12/20/2011 1016   CO2 22 02/24/2013 1317   CO2 30 02/19/2012 0910  CO2 27 12/20/2011 1016   BUN 13.8 02/24/2013 1317   BUN 14 02/19/2012 0910   BUN 18  12/20/2011 1016   CREATININE 0.8 02/24/2013 1317   CREATININE 1.0 02/19/2012 0910   CREATININE 0.90 12/20/2011 1016      Component Value Date/Time   CALCIUM 9.2 02/24/2013 1317   CALCIUM 8.5 02/19/2012 0910   CALCIUM 9.0 12/20/2011 1016   ALKPHOS 51 02/24/2013 1317   ALKPHOS 53 02/19/2012 0910   ALKPHOS 59 12/20/2011 1016   AST 17 02/24/2013 1317   AST 21 02/19/2012 0910   AST 15 12/20/2011 1016   ALT 16 02/24/2013 1317   ALT 13 12/20/2011 1016   BILITOT 0.88 02/24/2013 1317   BILITOT 0.80 02/19/2012 0910   BILITOT 0.5 12/20/2011 1016      Impression and Plan: This is a pleasant 62 year old gentleman with the following issues:  1. Stage IV colorectal cancer. 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 He had a liver metastasis that has been ablated. His CT scan from 01/2013 showed stable disease with slight progression. Counts are back to normal, we will resume chemotherapy at this time. I will repeat scan after 12 cycles.  2. Neutropenia. Resolved now.  3. Syncopal episode. Likely a vagal response. Resolved now.  4. Port-A-Cath management. PAC in place for chemotherapy.  5. Follow-up. In 2 weeks.      SHADAD,FIRAS 7/8/20149:54 AM

## 2013-04-02 ENCOUNTER — Telehealth: Payer: Self-pay | Admitting: Oncology

## 2013-04-02 ENCOUNTER — Ambulatory Visit (HOSPITAL_BASED_OUTPATIENT_CLINIC_OR_DEPARTMENT_OTHER): Payer: BC Managed Care – PPO

## 2013-04-02 VITALS — BP 104/76 | HR 60 | Temp 96.1°F | Resp 18

## 2013-04-02 DIAGNOSIS — C787 Secondary malignant neoplasm of liver and intrahepatic bile duct: Secondary | ICD-10-CM

## 2013-04-02 DIAGNOSIS — C19 Malignant neoplasm of rectosigmoid junction: Secondary | ICD-10-CM

## 2013-04-02 DIAGNOSIS — C189 Malignant neoplasm of colon, unspecified: Secondary | ICD-10-CM

## 2013-04-02 MED ORDER — HEPARIN SOD (PORK) LOCK FLUSH 100 UNIT/ML IV SOLN
500.0000 [IU] | Freq: Once | INTRAVENOUS | Status: AC | PRN
  Administered 2013-04-02: 500 [IU]
  Filled 2013-04-02: qty 5

## 2013-04-02 MED ORDER — SODIUM CHLORIDE 0.9 % IJ SOLN
10.0000 mL | INTRAMUSCULAR | Status: DC | PRN
  Administered 2013-04-02: 10 mL
  Filled 2013-04-02: qty 10

## 2013-04-02 NOTE — Patient Instructions (Signed)
Call MD for problems or concerns 

## 2013-04-02 NOTE — Telephone Encounter (Signed)
, °

## 2013-04-03 ENCOUNTER — Telehealth: Payer: Self-pay | Admitting: *Deleted

## 2013-04-03 NOTE — Telephone Encounter (Signed)
Per staff message and POF I have scheduled appts.  JMW  

## 2013-04-15 ENCOUNTER — Ambulatory Visit: Payer: BC Managed Care – PPO | Admitting: Oncology

## 2013-04-15 ENCOUNTER — Other Ambulatory Visit: Payer: BC Managed Care – PPO | Admitting: Lab

## 2013-04-15 ENCOUNTER — Ambulatory Visit: Payer: BC Managed Care – PPO

## 2013-04-29 ENCOUNTER — Other Ambulatory Visit (HOSPITAL_BASED_OUTPATIENT_CLINIC_OR_DEPARTMENT_OTHER): Payer: BC Managed Care – PPO | Admitting: Lab

## 2013-04-29 ENCOUNTER — Ambulatory Visit (HOSPITAL_BASED_OUTPATIENT_CLINIC_OR_DEPARTMENT_OTHER): Payer: BC Managed Care – PPO

## 2013-04-29 ENCOUNTER — Ambulatory Visit (HOSPITAL_BASED_OUTPATIENT_CLINIC_OR_DEPARTMENT_OTHER): Payer: BC Managed Care – PPO | Admitting: Oncology

## 2013-04-29 VITALS — BP 125/82 | HR 61 | Temp 97.0°F | Resp 20 | Ht 73.0 in | Wt 184.9 lb

## 2013-04-29 DIAGNOSIS — Z5112 Encounter for antineoplastic immunotherapy: Secondary | ICD-10-CM

## 2013-04-29 DIAGNOSIS — C7970 Secondary malignant neoplasm of unspecified adrenal gland: Secondary | ICD-10-CM

## 2013-04-29 DIAGNOSIS — C19 Malignant neoplasm of rectosigmoid junction: Secondary | ICD-10-CM

## 2013-04-29 DIAGNOSIS — C189 Malignant neoplasm of colon, unspecified: Secondary | ICD-10-CM

## 2013-04-29 DIAGNOSIS — Z5111 Encounter for antineoplastic chemotherapy: Secondary | ICD-10-CM

## 2013-04-29 DIAGNOSIS — C787 Secondary malignant neoplasm of liver and intrahepatic bile duct: Secondary | ICD-10-CM

## 2013-04-29 DIAGNOSIS — K1370 Unspecified lesions of oral mucosa: Secondary | ICD-10-CM

## 2013-04-29 LAB — CBC WITH DIFFERENTIAL/PLATELET
BASO%: 0.6 % (ref 0.0–2.0)
EOS%: 0.9 % (ref 0.0–7.0)
LYMPH%: 23.8 % (ref 14.0–49.0)
MCHC: 33.8 g/dL (ref 32.0–36.0)
MCV: 88.2 fL (ref 79.3–98.0)
MONO%: 13.6 % (ref 0.0–14.0)
Platelets: 181 10*3/uL (ref 140–400)
RBC: 4.56 10*6/uL (ref 4.20–5.82)
RDW: 14 % (ref 11.0–14.6)
WBC: 6.6 10*3/uL (ref 4.0–10.3)

## 2013-04-29 LAB — POCT UA - GLUCOSE/PROTEIN: Protein, UA: 30

## 2013-04-29 LAB — COMPREHENSIVE METABOLIC PANEL (CC13)
ALT: 13 U/L (ref 0–55)
AST: 15 U/L (ref 5–34)
Alkaline Phosphatase: 53 U/L (ref 40–150)
Potassium: 3.8 mEq/L (ref 3.5–5.1)
Sodium: 142 mEq/L (ref 136–145)
Total Bilirubin: 0.62 mg/dL (ref 0.20–1.20)
Total Protein: 6.8 g/dL (ref 6.4–8.3)

## 2013-04-29 MED ORDER — LEUCOVORIN CALCIUM INJECTION 350 MG
400.0000 mg/m2 | Freq: Once | INTRAVENOUS | Status: AC
  Administered 2013-04-29: 808 mg via INTRAVENOUS
  Filled 2013-04-29: qty 40.4

## 2013-04-29 MED ORDER — SODIUM CHLORIDE 0.9 % IV SOLN
5.0000 mg/kg | Freq: Once | INTRAVENOUS | Status: AC
  Administered 2013-04-29: 450 mg via INTRAVENOUS
  Filled 2013-04-29: qty 18

## 2013-04-29 MED ORDER — FLUOROURACIL CHEMO INJECTION 2.5 GM/50ML
400.0000 mg/m2 | Freq: Once | INTRAVENOUS | Status: AC
  Administered 2013-04-29: 800 mg via INTRAVENOUS
  Filled 2013-04-29: qty 16

## 2013-04-29 MED ORDER — DEXAMETHASONE SODIUM PHOSPHATE 20 MG/5ML IJ SOLN
20.0000 mg | Freq: Once | INTRAMUSCULAR | Status: AC
  Administered 2013-04-29: 20 mg via INTRAVENOUS

## 2013-04-29 MED ORDER — SODIUM CHLORIDE 0.9 % IV SOLN
Freq: Once | INTRAVENOUS | Status: AC
  Administered 2013-04-29: 10:00:00 via INTRAVENOUS

## 2013-04-29 MED ORDER — ATROPINE SULFATE 0.4 MG/ML IJ SOLN
0.5000 mg | Freq: Once | INTRAMUSCULAR | Status: AC | PRN
  Administered 2013-04-29: 0.5 mg via INTRAVENOUS
  Filled 2013-04-29: qty 1.25

## 2013-04-29 MED ORDER — IRINOTECAN HCL CHEMO INJECTION 100 MG/5ML
180.0000 mg/m2 | Freq: Once | INTRAVENOUS | Status: AC
  Administered 2013-04-29: 364 mg via INTRAVENOUS
  Filled 2013-04-29: qty 18.2

## 2013-04-29 MED ORDER — ONDANSETRON 16 MG/50ML IVPB (CHCC)
16.0000 mg | Freq: Once | INTRAVENOUS | Status: AC
  Administered 2013-04-29: 16 mg via INTRAVENOUS

## 2013-04-29 MED ORDER — SODIUM CHLORIDE 0.9 % IV SOLN
2400.0000 mg/m2 | INTRAVENOUS | Status: DC
  Administered 2013-04-29: 4850 mg via INTRAVENOUS
  Filled 2013-04-29: qty 97

## 2013-04-29 MED ORDER — MAGIC MOUTHWASH W/LIDOCAINE: 5.0000 mL | Freq: Three times a day (TID) | ORAL | Status: DC | PRN

## 2013-04-29 NOTE — Progress Notes (Signed)
Hematology and Oncology Follow Up Visit  Christopher Jimenez [id number] [birth year] 62 y.o. 04/29/2013 9:23 AM Christopher Jimenez [name], MDRoss, [name], MD   Principle Diagnosis: A 62 year old gentleman diagnosed with a T3 N1 rectosigmoid adenocarcinoma. He had 1/14 lymph nodes involved diagnosed in December 2010. 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  Prior Therapy: 1. Underwent a lower anterior resection done in January 2012. 2. Underwent adjuvant radiation therapy with Xeloda concluded in March 2011. 3. Received adjuvant FOLFOX therapy concluded in July 2012. 4. The patient had involvement of his adrenal gland and underwent adrenalectomy on April 2012 under the care of Dr. Johna Sheriff. 5. He is status post a microwave ablation of the hepatic metastasis done July 06, 2011.  Current therapy: On systemic chemotherapy with FOLFIRI/Avastin started on 07/21/12. Here for cycle 11 evaluation  today.  Interim History:  Christopher Jimenez presents today for a followup visit. 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 He is tolerating last chemotherapy well. He is not reporting any abdominal pain. His performance status activity level remains excellent. He does report some occasional fatigue at exertion but had not report any shortness of breath. Had not reported any cough, not associated with any hemoptysis or hematemesis. No fevers or chills. No headaches, visual changes, or other neurological symptoms. He did report mouth pain with the last treatment.   Medications: I have reviewed the patient's current medications.  Current Outpatient Prescriptions  Medication Sig Dispense Refill  . Alum & Mag Hydroxide-Simeth (MAGIC MOUTHWASH W/LIDOCAINE) SOLN Take 5 mLs by mouth 3 (three) times daily as needed.  240 mL  1   No current  facility-administered medications for this visit.    Allergies: No Known Allergies  Past Medical History, Surgical history, Social history, and Family History were reviewed and updated.  Review of Systems: Constitutional:  Negative for fever, chills, night sweats, anorexia, weight loss, pain. Cardiovascular: no chest pain or dyspnea on exertion Respiratory: no cough, shortness of breath, or wheezing Neurological: no TIA or stroke symptoms Dermatological: negative ENT: negative Skin: Negative. Gastrointestinal: no abdominal pain, change in bowel habits, or black or bloody stools Genito-Urinary: no dysuria, trouble voiding, or hematuria Hematological and Lymphatic: negative Breast: negative for breast lumps Musculoskeletal: negative Remaining ROS negative.  Physical Exam: Blood pressure 125/82, pulse 61, temperature 97 F (36.1 C), temperature source Oral, resp. rate 20, height 6\' 1"  (1.854 m), weight 184 lb 14.4 oz (83.87 kg). ECOG: 0 General appearance: alert, cooperative and no distress Head: Normocephalic, without obvious abnormality, atraumatic Neck: no adenopathy, no carotid bruit, no JVD, supple, symmetrical, trachea midline and thyroid not enlarged, symmetric, no tenderness/mass/nodules Lymph nodes: Cervical, supraclavicular, and axillary nodes normal. Heart:regular rate and rhythm, S1, S2 normal, no murmur, click, rub or gallop Lung:chest clear, no wheezing, rales, normal symmetric air entry, no tachypnea, retractions or cyanosis Abdomen: soft, non-tender, without masses or organomegaly EXT:no erythema, induration, or nodules   Lab Results: Lab Results  Component Value Date   WBC 6.6 04/29/2013   HGB 13.6 04/29/2013   HCT 40.2 04/29/2013   MCV 88.2 04/29/2013   PLT 181 04/29/2013     Chemistry      Component Value Date/Time   NA 139 03/31/2013 0841   NA 142 02/19/2012 0910   NA 140 12/20/2011 1016   K 4.0 03/31/2013 0841   K  5.0* 02/19/2012 0910   K 4.5 12/20/2011 1016   CL  105 02/24/2013 1317   CL 99 02/19/2012 0910   CL 105 12/20/2011 1016   CO2 28 03/31/2013 0841   CO2 30 02/19/2012 0910   CO2 27 12/20/2011 1016   BUN 17.2 03/31/2013 0841   BUN 14 02/19/2012 0910   BUN 18 12/20/2011 1016   CREATININE 0.8 03/31/2013 0841   CREATININE 1.0 02/19/2012 0910   CREATININE 0.90 12/20/2011 1016      Component Value Date/Time   CALCIUM 9.6 03/31/2013 0841   CALCIUM 8.5 02/19/2012 0910   CALCIUM 9.0 12/20/2011 1016   ALKPHOS 41 03/31/2013 0841   ALKPHOS 53 02/19/2012 0910   ALKPHOS 59 12/20/2011 1016   AST 15 03/31/2013 0841   AST 21 02/19/2012 0910   AST 15 12/20/2011 1016   ALT 13 03/31/2013 0841   ALT 24 02/19/2012 0910   ALT 13 12/20/2011 1016   BILITOT 0.70 03/31/2013 0841   BILITOT 0.80 02/19/2012 0910   BILITOT 0.5 12/20/2011 1016      Impression and Plan: This is a pleasant 62 year old gentleman with the following issues:  1. Stage IV colorectal cancer. 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 He had a liver metastasis that has been ablated. His CT scan from 01/2013 showed stable disease with slight progression. He will continue with chemotherapy at this time with cycle 11 today, cycle 12 on 8/27. I will repeat scan on 9/8 and determine the course of action after that. I have requested KRAS mutation status on his tumor to see weather he will be able to get EGFR targeted treatment.  3. Mouth pain: I gave RX for magic mouth wash.   4. Port-A-Cath management. PAC in place for chemotherapy.  5. Follow-up. In 3 weeks for the next cycle of chemotherapy.       [facility] 8/6/20149:23 AM

## 2013-04-29 NOTE — Patient Instructions (Signed)
[facility] Discharge Instructions for Patients Receiving Chemotherapy  Today you received the following chemotherapy agents :  Avastin, Camptosar, Leucovorin, 5FU.  To help prevent nausea and vomiting after your treatment, we encourage you to take your nausea medication as instructed by your physician.   If you develop nausea and vomiting that is not controlled by your nausea medication, call the clinic.   BELOW ARE SYMPTOMS THAT SHOULD BE REPORTED IMMEDIATELY:  *FEVER GREATER THAN 100.5 F  *CHILLS WITH OR WITHOUT FEVER  NAUSEA AND VOMITING THAT IS NOT CONTROLLED WITH YOUR NAUSEA MEDICATION  *UNUSUAL SHORTNESS OF BREATH  *UNUSUAL BRUISING OR BLEEDING  TENDERNESS IN MOUTH AND THROAT WITH OR WITHOUT PRESENCE OF ULCERS  *URINARY PROBLEMS  *BOWEL PROBLEMS  UNUSUAL RASH Items with * indicate a potential emergency and should be followed up as soon as possible.  Feel free to call the clinic you have any questions or concerns. The clinic phone number is [phone].    

## 2013-04-30 ENCOUNTER — Telehealth: Payer: Self-pay | Admitting: Oncology

## 2013-05-01 ENCOUNTER — Ambulatory Visit (HOSPITAL_BASED_OUTPATIENT_CLINIC_OR_DEPARTMENT_OTHER): Payer: BC Managed Care – PPO

## 2013-05-01 VITALS — BP 126/86 | HR 58 | Temp 98.0°F | Resp 16

## 2013-05-01 DIAGNOSIS — C189 Malignant neoplasm of colon, unspecified: Secondary | ICD-10-CM

## 2013-05-01 DIAGNOSIS — Z452 Encounter for adjustment and management of vascular access device: Secondary | ICD-10-CM

## 2013-05-01 DIAGNOSIS — C801 Malignant (primary) neoplasm, unspecified: Secondary | ICD-10-CM

## 2013-05-01 DIAGNOSIS — C787 Secondary malignant neoplasm of liver and intrahepatic bile duct: Secondary | ICD-10-CM

## 2013-05-01 MED ORDER — SODIUM CHLORIDE 0.9 % IJ SOLN
10.0000 mL | INTRAMUSCULAR | Status: DC | PRN
  Administered 2013-05-01: 10 mL
  Filled 2013-05-01: qty 10

## 2013-05-01 MED ORDER — HEPARIN SOD (PORK) LOCK FLUSH 100 UNIT/ML IV SOLN
500.0000 [IU] | Freq: Once | INTRAVENOUS | Status: AC | PRN
  Administered 2013-05-01: 500 [IU]
  Filled 2013-05-01: qty 5

## 2013-05-12 ENCOUNTER — Other Ambulatory Visit (HOSPITAL_COMMUNITY): Payer: Self-pay | Admitting: Interventional Radiology

## 2013-05-12 DIAGNOSIS — C787 Secondary malignant neoplasm of liver and intrahepatic bile duct: Secondary | ICD-10-CM

## 2013-05-12 DIAGNOSIS — C189 Malignant neoplasm of colon, unspecified: Secondary | ICD-10-CM

## 2013-05-20 ENCOUNTER — Encounter: Payer: Self-pay | Admitting: Oncology

## 2013-05-20 ENCOUNTER — Other Ambulatory Visit (HOSPITAL_BASED_OUTPATIENT_CLINIC_OR_DEPARTMENT_OTHER): Payer: BC Managed Care – PPO | Admitting: Lab

## 2013-05-20 ENCOUNTER — Ambulatory Visit (HOSPITAL_BASED_OUTPATIENT_CLINIC_OR_DEPARTMENT_OTHER): Payer: BC Managed Care – PPO | Admitting: Oncology

## 2013-05-20 VITALS — BP 104/76 | HR 84 | Temp 97.2°F | Resp 19 | Ht 73.0 in | Wt 182.4 lb

## 2013-05-20 DIAGNOSIS — C189 Malignant neoplasm of colon, unspecified: Secondary | ICD-10-CM

## 2013-05-20 DIAGNOSIS — C7970 Secondary malignant neoplasm of unspecified adrenal gland: Secondary | ICD-10-CM

## 2013-05-20 DIAGNOSIS — C787 Secondary malignant neoplasm of liver and intrahepatic bile duct: Secondary | ICD-10-CM

## 2013-05-20 DIAGNOSIS — C19 Malignant neoplasm of rectosigmoid junction: Secondary | ICD-10-CM

## 2013-05-20 LAB — CBC WITH DIFFERENTIAL/PLATELET
BASO%: 0.6 % (ref 0.0–2.0)
EOS%: 6.1 % (ref 0.0–7.0)
HCT: 39.9 % (ref 38.4–49.9)
MCH: 29.6 pg (ref 27.2–33.4)
MCHC: 33.6 g/dL (ref 32.0–36.0)
MONO#: 0.9 10*3/uL (ref 0.1–0.9)
NEUT%: 48.7 % (ref 39.0–75.0)
RBC: 4.53 10*6/uL (ref 4.20–5.82)
RDW: 14.6 % (ref 11.0–14.6)
WBC: 4.9 10*3/uL (ref 4.0–10.3)
lymph#: 1.3 10*3/uL (ref 0.9–3.3)

## 2013-05-20 LAB — COMPREHENSIVE METABOLIC PANEL (CC13)
Albumin: 3.9 g/dL (ref 3.5–5.0)
CO2: 23 mEq/L (ref 22–29)
Chloride: 107 mEq/L (ref 98–109)
Potassium: 4.7 mEq/L (ref 3.5–5.1)
Sodium: 141 mEq/L (ref 136–145)

## 2013-05-20 MED ORDER — MAGIC MOUTHWASH W/LIDOCAINE: 5.0000 mL | Freq: Three times a day (TID) | ORAL | Status: DC | PRN

## 2013-05-20 NOTE — Progress Notes (Signed)
Hematology and Oncology Follow Up Visit  Christopher Jimenez [id number] Jun 11, 1951 62 y.o. 05/20/2013 2:37 PM [name],[name] [name], MDRoss, [name], MD   Principle Diagnosis: A 62 year old gentleman diagnosed with a T3 N1 rectosigmoid adenocarcinoma. He had 1/14 lymph nodes involved diagnosed in December 2010. 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  Prior Therapy: 1. Underwent a lower anterior resection done in January 2012. 2. Underwent adjuvant radiation therapy with Xeloda concluded in March 2011. 3. Received adjuvant FOLFOX therapy concluded in July 2012. 4. The patient had involvement of his adrenal gland and underwent adrenalectomy on April 2012 under the care of Dr. Johna Sheriff. 5. He is status post a microwave ablation of the hepatic metastasis done July 06, 2011.  Current therapy: On systemic chemotherapy with FOLFIRI/Avastin started on 07/21/12. Here for cycle 12 evaluation  today.  Interim History:  Mr. Cuffe presents today for a followup visit. 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 He had more side effects from his last chemo. He is not reporting any abdominal pain. His performance status activity level remains excellent. He does report some occasional fatigue at exertion but had not report any shortness of breath. Had not reported any cough, not associated with any hemoptysis or hematemesis. No fevers or chills. No headaches, visual changes, or other neurological symptoms. He did report mouth pain with the last treatment. Thinks he needs dental treatment for this. He would like to hold off on chemo today due to not feeling 100%.  Medications: I have reviewed the patient's current medications.  Current Outpatient Prescriptions  Medication Sig Dispense Refill  . Alum & Mag Hydroxide-Simeth (MAGIC  MOUTHWASH W/LIDOCAINE) SOLN Take 5 mLs by mouth 3 (three) times daily as needed.  240 mL  1   No current facility-administered medications for this visit.    Allergies: No Known Allergies  Past Medical History, Surgical history, Social history, and Family History were reviewed and updated.  Review of Systems: Constitutional:  Negative for fever, chills, night sweats, anorexia, weight loss, pain. Cardiovascular: no chest pain or dyspnea on exertion Respiratory: no cough, shortness of breath, or wheezing Neurological: no TIA or stroke symptoms Dermatological: negative ENT: negative Skin: Negative. Gastrointestinal: no abdominal pain, change in bowel habits, or black or bloody stools Genito-Urinary: no dysuria, trouble voiding, or hematuria Hematological and Lymphatic: negative Breast: negative for breast lumps Musculoskeletal: negative Remaining ROS negative.  Physical Exam: Blood pressure 104/76, pulse 84, temperature 97.2 F (36.2 C), temperature source Oral, resp. rate 19, height 6\' 1"  (1.854 m), weight 182 lb 6.4 oz (82.736 kg). ECOG: 0 General appearance: alert, cooperative and no distress Head: Normocephalic, without obvious abnormality, atraumatic Neck: no adenopathy, no carotid bruit, no JVD, supple, symmetrical, trachea midline and thyroid not enlarged, symmetric, no tenderness/mass/nodules Lymph nodes: Cervical, supraclavicular, and axillary nodes normal. Heart:regular rate and rhythm, S1, S2 normal, no murmur, click, rub or gallop Lung:chest clear, no wheezing, rales, normal symmetric air entry, no tachypnea, retractions or cyanosis Abdomen: soft, non-tender, without masses or organomegaly EXT:no erythema, induration, or nodules   Lab Results: Lab Results  Component Value Date   WBC 4.9 05/20/2013   HGB 13.4 05/20/2013   HCT 39.9 05/20/2013   MCV 88.1 05/20/2013   PLT 260 05/20/2013     Chemistry      Component Value Date/Time   NA 141  05/20/2013 1141   NA 142  02/19/2012 0910   NA 140 12/20/2011 1016   K 4.7 05/20/2013 1141   K 5.0* 02/19/2012 0910   K 4.5 12/20/2011 1016   CL 105 02/24/2013 1317   CL 99 02/19/2012 0910   CL 105 12/20/2011 1016   CO2 23 05/20/2013 1141   CO2 30 02/19/2012 0910   CO2 27 12/20/2011 1016   BUN 15.5 05/20/2013 1141   BUN 14 02/19/2012 0910   BUN 18 12/20/2011 1016   CREATININE 1.0 05/20/2013 1141   CREATININE 1.0 02/19/2012 0910   CREATININE 0.90 12/20/2011 1016      Component Value Date/Time   CALCIUM 9.8 05/20/2013 1141   CALCIUM 8.5 02/19/2012 0910   CALCIUM 9.0 12/20/2011 1016   ALKPHOS 49 05/20/2013 1141   ALKPHOS 53 02/19/2012 0910   ALKPHOS 59 12/20/2011 1016   AST 15 05/20/2013 1141   AST 21 02/19/2012 0910   AST 15 12/20/2011 1016   ALT 17 05/20/2013 1141   ALT 24 02/19/2012 0910   ALT 13 12/20/2011 1016   BILITOT 1.05 05/20/2013 1141   BILITOT 0.80 02/19/2012 0910   BILITOT 0.5 12/20/2011 1016      Impression and Plan: This is a pleasant 62 year old gentleman with the following issues:  1. Stage IV colorectal cancer. 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 He had a liver metastasis that has been ablated. His CT scan from 01/2013 showed stable disease with slight progression. Per his request, I will hold his chemo today. I will repeat scan on 9/8 and determine the course of action after that. I have requested KRAS mutation status on his tumor to see weather he will be able to get EGFR targeted treatment.  2. Mouth pain: I gave RX for magic mouth wash.   3. Port-A-Cath management. PAC in place for chemotherapy.  4. Follow-up. In 2 weeks for the next cycle of chemotherapy.       [place], [place] 8/27/20142:37 PM

## 2013-06-01 ENCOUNTER — Encounter (HOSPITAL_COMMUNITY): Payer: Self-pay

## 2013-06-01 ENCOUNTER — Ambulatory Visit (HOSPITAL_COMMUNITY)
Admission: RE | Admit: 2013-06-01 | Discharge: 2013-06-01 | Disposition: A | Discharge status: Home / Self Care | Payer: BC Managed Care – PPO | Source: Ambulatory Visit | Attending: Oncology | Admitting: Oncology

## 2013-06-01 ENCOUNTER — Other Ambulatory Visit: Payer: BC Managed Care – PPO | Admitting: Lab

## 2013-06-01 DIAGNOSIS — Z933 Colostomy status: Secondary | ICD-10-CM | POA: Insufficient documentation

## 2013-06-01 DIAGNOSIS — N281 Cyst of kidney, acquired: Secondary | ICD-10-CM | POA: Insufficient documentation

## 2013-06-01 DIAGNOSIS — R918 Other nonspecific abnormal finding of lung field: Secondary | ICD-10-CM | POA: Insufficient documentation

## 2013-06-01 DIAGNOSIS — C7970 Secondary malignant neoplasm of unspecified adrenal gland: Secondary | ICD-10-CM | POA: Insufficient documentation

## 2013-06-01 DIAGNOSIS — C189 Malignant neoplasm of colon, unspecified: Secondary | ICD-10-CM | POA: Insufficient documentation

## 2013-06-01 DIAGNOSIS — R599 Enlarged lymph nodes, unspecified: Secondary | ICD-10-CM | POA: Insufficient documentation

## 2013-06-01 MED ORDER — IOHEXOL 300 MG/ML  SOLN
125.0000 mL | Freq: Once | INTRAMUSCULAR | Status: AC | PRN
  Administered 2013-06-01: 125 mL via INTRAVENOUS

## 2013-06-02 ENCOUNTER — Ambulatory Visit
Admission: RE | Admit: 2013-06-02 | Discharge: 2013-06-02 | Disposition: A | Discharge status: Home / Self Care | Payer: BC Managed Care – PPO | Source: Ambulatory Visit | Attending: Interventional Radiology | Admitting: Interventional Radiology

## 2013-06-02 DIAGNOSIS — C787 Secondary malignant neoplasm of liver and intrahepatic bile duct: Secondary | ICD-10-CM

## 2013-06-02 DIAGNOSIS — C189 Malignant neoplasm of colon, unspecified: Secondary | ICD-10-CM

## 2013-06-03 ENCOUNTER — Ambulatory Visit (HOSPITAL_BASED_OUTPATIENT_CLINIC_OR_DEPARTMENT_OTHER): Payer: BC Managed Care – PPO | Admitting: Oncology

## 2013-06-03 ENCOUNTER — Other Ambulatory Visit: Payer: BC Managed Care – PPO | Admitting: Lab

## 2013-06-03 VITALS — BP 108/67 | HR 64 | Temp 97.8°F | Resp 18 | Ht 73.0 in | Wt 186.4 lb

## 2013-06-03 DIAGNOSIS — C189 Malignant neoplasm of colon, unspecified: Secondary | ICD-10-CM

## 2013-06-03 NOTE — Progress Notes (Signed)
Hematology and Oncology Follow Up Visit  Christopher Jimenez [id number] 1950/12/07 62 y.o. 06/03/2013 2:04 PM [name],[name] [name], MDRoss, [name], MD   Principle Diagnosis: A 62 year old gentleman diagnosed with a T3 N1 rectosigmoid adenocarcinoma. He had 1/14 lymph nodes involved diagnosed in December 2010. 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  Prior Therapy: 1. Underwent a lower anterior resection done in January 2012. 2. Underwent adjuvant radiation therapy with Xeloda concluded in March 2011. 3. Received adjuvant FOLFOX therapy concluded in July 2012. 4. The patient had involvement of his adrenal gland and underwent adrenalectomy on April 2012 under the care of Dr. Johna Sheriff. 5. He is status post a microwave ablation of the hepatic metastasis done July 06, 2011.  Current therapy: On systemic chemotherapy with FOLFIRI/Avastin started on 07/21/12. S/P 12 cycles completed on 04/2013.   Interim History:  [name] presents today for a followup visit. 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 He had more side effects from his last chemo before the last treatment but doing better now. He is not reporting any abdominal pain. His performance status activity level remains excellent. He does report some occasional fatigue at exertion but had not report any shortness of breath. Had not reported any cough, not associated with any hemoptysis or hematemesis. No fevers or chills. No headaches, visual changes, or other neurological symptoms. He did report mouth pain with the last treatment.   Medications: I have reviewed the patient's current medications.  Current Outpatient Prescriptions  Medication Sig Dispense Refill  . Alum & Mag Hydroxide-Simeth (MAGIC MOUTHWASH W/LIDOCAINE) SOLN Take 5 mLs by mouth 3 (three)  times daily as needed.  240 mL  1   No current facility-administered medications for this visit.    Allergies: No Known Allergies  Past Medical History, Surgical history, Social history, and Family History were reviewed and updated.  Review of Systems:  Remaining ROS negative.  Physical Exam: Blood pressure 108/67, pulse 64, temperature 97.8 F (36.6 C), temperature source Oral, resp. rate 18, height 6\' 1"  (1.854 m), weight 186 lb 6.4 oz (84.55 kg), SpO2 100.00%. ECOG: 0 General appearance: alert, cooperative and no distress Head: Normocephalic, without obvious abnormality, atraumatic Neck: no adenopathy, no carotid bruit, no JVD, supple, symmetrical, trachea midline and thyroid not enlarged, symmetric, no tenderness/mass/nodules Lymph nodes: Cervical, supraclavicular, and axillary nodes normal. Heart:regular rate and rhythm, S1, S2 normal, no murmur, click, rub or gallop Lung:chest clear, no wheezing, rales, normal symmetric air entry, no tachypnea, retractions or cyanosis Abdomen: soft, non-tender, without masses or organomegaly EXT:no erythema, induration, or nodules   Lab Results: Lab Results  Component Value Date   WBC 4.9 05/20/2013   HGB 13.4 05/20/2013   HCT 39.9 05/20/2013   MCV 88.1 05/20/2013   PLT 260 05/20/2013     Chemistry      Component Value Date/Time   NA 141 05/20/2013 1141   NA 142 02/19/2012 0910   NA 140 12/20/2011 1016   K 4.7 05/20/2013 1141   K 5.0* 02/19/2012 0910   K 4.5 12/20/2011 1016   CL 105 02/24/2013 1317   CL 99 02/19/2012 0910   CL 105 12/20/2011 1016   CO2 23 05/20/2013 1141   CO2 30 02/19/2012 0910   CO2 27 12/20/2011 1016   BUN 15.5 05/20/2013 1141   BUN 14 02/19/2012 0910  BUN 18 12/20/2011 1016   CREATININE 1.0 05/20/2013 1141   CREATININE 1.0 02/19/2012 0910   CREATININE 0.90 12/20/2011 1016      Component Value Date/Time   CALCIUM 9.8 05/20/2013 1141   CALCIUM 8.5 02/19/2012 0910   CALCIUM 9.0 12/20/2011 1016   ALKPHOS 49 05/20/2013 1141    ALKPHOS 53 02/19/2012 0910   ALKPHOS 59 12/20/2011 1016   AST 15 05/20/2013 1141   AST 21 02/19/2012 0910   AST 15 12/20/2011 1016   ALT 17 05/20/2013 1141   ALT 24 02/19/2012 0910   ALT 13 12/20/2011 1016   BILITOT 1.05 05/20/2013 1141   BILITOT 0.80 02/19/2012 0910   BILITOT 0.5 12/20/2011 1016     CONTRAST: OMNIPAQUE IOHEXOL 300 MG/ML SOLN  COMPARISON: 02/09/13  FINDINGS:  CT CHEST FINDINGS  Scattered small less than 1 cm pulmonary nodules the in both lungs  are mostly stable, although there is 1 new nodule measuring 5 mm  seen in the posterior left lower lobe on image 34. This could be  related to spatial misregistration artifact.  There are 2 ill-defined nodular opacities in the posterior right  upper lobe which are increased in size, now measuring 1.7 cm and 1.6  cm compared to previous measurements of 1.2 and 1.3 cm respectively.  One of these opacities shows a central lucency which may represent a  air bronchogram or less likely cavitation. The segmental  distribution of these opacities is suggestive of superimposed  inflammatory or infectious process, with progression of pulmonary  metastases considered less likely. No evidence of pleural or  pericardial effusion.  Mild right paratracheal mediastinal adenopathy and right hilar  lymphadenopathy remains stable. No new or progressive adenopathy  seen elsewhere within the thorax. No evidence of chest wall mass or  suspicious bone lesions.  CT ABDOMEN AND PELVIS FINDINGS  Lesion at site of previous microwave ablation in the right hepatic  lobe currently measures 1.4 x 3.3 cm has mildly decreased in size  since previous study. No new or enlarging liver masses are  identified. The pancreas, spleen, and left adrenal gland are normal  in appearance. Prior right adrenalectomy again noted. Small  bilateral renal cysts are stable and there is no evidence of renal  masses or hydronephrosis.  No other pelvic soft tissue masses or  lymphadenopathy identified.  Left lower quadrant colostomy is stable in appearance. Soft tissue  stranding and presacral region is also unchanged in appearance. No  evidence of inflammatory process or abnormal fluid collections. No  evidence of bowel obstruction. No suspicious bone lesions  identified.  IMPRESSION:  CT CHEST IMPRESSION  Increased size of segmental ill-defined nodular opacities in the  posterior right upper lobe. This is suggestive of a superimposed  inflammatory or infectious process, with progression of pulmonary  metastases considered less likely.  Stable right paratracheal and right hilar lymphadenopathy.  CT ABDOMEN AND PELVIS IMPRESSION  Mild decrease in size of right hepatic lobe lesion at site of prior  the microwave ablation.  Stable left lower quadrant colostomy and presacral soft tissue  density consistent with post treatment change. No new or progressive  disease within the abdomen or pelvis.   Impression and Plan: This is a pleasant 62 year old gentleman with the following issues:  1. Stage IV colorectal cancer. 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 He had a liver metastasis that has been ablated. His CT scan from 06/01/2013 was reviewed and showed stable disease without progression. For now, we  will resume chemotherapy with only 5 FU/LV and Avastin only starting on 07/01/2013.  2. Inflammatory changes in the lung. He is asymptomatic and no clinical evidence of pneumonia.  3. Port-A-Cath management. PAC in place for chemotherapy.  4. Follow-up. On 10/8 for the restart of chemotherapy.      Deira Shimer 9/10/20142:04 PM

## 2013-06-04 ENCOUNTER — Telehealth: Payer: Self-pay | Admitting: Oncology

## 2013-06-04 ENCOUNTER — Telehealth: Payer: Self-pay | Admitting: *Deleted

## 2013-06-04 NOTE — Telephone Encounter (Signed)
s.w. pt and advised on OCT appts....pt ok and aware °

## 2013-06-04 NOTE — Telephone Encounter (Signed)
Per staff message and POF I have scheduled appts.  JMW  

## 2013-07-01 ENCOUNTER — Ambulatory Visit (HOSPITAL_BASED_OUTPATIENT_CLINIC_OR_DEPARTMENT_OTHER): Payer: BC Managed Care – PPO

## 2013-07-01 ENCOUNTER — Other Ambulatory Visit (HOSPITAL_BASED_OUTPATIENT_CLINIC_OR_DEPARTMENT_OTHER): Payer: BC Managed Care – PPO | Admitting: Lab

## 2013-07-01 VITALS — BP 139/85 | HR 57 | Temp 97.5°F

## 2013-07-01 DIAGNOSIS — C19 Malignant neoplasm of rectosigmoid junction: Secondary | ICD-10-CM

## 2013-07-01 DIAGNOSIS — C787 Secondary malignant neoplasm of liver and intrahepatic bile duct: Secondary | ICD-10-CM

## 2013-07-01 DIAGNOSIS — C189 Malignant neoplasm of colon, unspecified: Secondary | ICD-10-CM

## 2013-07-01 DIAGNOSIS — Z5112 Encounter for antineoplastic immunotherapy: Secondary | ICD-10-CM

## 2013-07-01 DIAGNOSIS — Z5111 Encounter for antineoplastic chemotherapy: Secondary | ICD-10-CM

## 2013-07-01 LAB — COMPREHENSIVE METABOLIC PANEL (CC13)
ALT: 11 U/L (ref 0–55)
AST: 16 U/L (ref 5–34)
Anion Gap: 7 mEq/L (ref 3–11)
CO2: 27 mEq/L (ref 22–29)
Calcium: 9.5 mg/dL (ref 8.4–10.4)
Chloride: 107 mEq/L (ref 98–109)
Potassium: 4.6 mEq/L (ref 3.5–5.1)
Sodium: 141 mEq/L (ref 136–145)
Total Protein: 7.1 g/dL (ref 6.4–8.3)

## 2013-07-01 LAB — CBC WITH DIFFERENTIAL/PLATELET
BASO%: 0.7 % (ref 0.0–2.0)
HCT: 40.6 % (ref 38.4–49.9)
MCHC: 33.7 g/dL (ref 32.0–36.0)
MONO#: 0.5 10*3/uL (ref 0.1–0.9)
NEUT%: 56.2 % (ref 39.0–75.0)
RBC: 4.62 10*6/uL (ref 4.20–5.82)
RDW: 14 % (ref 11.0–14.6)
WBC: 4.3 10*3/uL (ref 4.0–10.3)
lymph#: 1 10*3/uL (ref 0.9–3.3)

## 2013-07-01 MED ORDER — SODIUM CHLORIDE 0.9 % IJ SOLN
10.0000 mL | INTRAMUSCULAR | Status: DC | PRN
  Filled 2013-07-01: qty 10

## 2013-07-01 MED ORDER — HEPARIN SOD (PORK) LOCK FLUSH 100 UNIT/ML IV SOLN
500.0000 [IU] | Freq: Once | INTRAVENOUS | Status: DC | PRN
  Filled 2013-07-01: qty 5

## 2013-07-01 MED ORDER — ONDANSETRON 16 MG/50ML IVPB (CHCC)
16.0000 mg | Freq: Once | INTRAVENOUS | Status: AC
  Administered 2013-07-01: 16 mg via INTRAVENOUS

## 2013-07-01 MED ORDER — DEXAMETHASONE SODIUM PHOSPHATE 20 MG/5ML IJ SOLN
INTRAMUSCULAR | Status: AC
  Filled 2013-07-01: qty 5

## 2013-07-01 MED ORDER — LEUCOVORIN CALCIUM INJECTION 350 MG
400.0000 mg/m2 | Freq: Once | INTRAVENOUS | Status: AC
  Administered 2013-07-01: 808 mg via INTRAVENOUS
  Filled 2013-07-01: qty 40.4

## 2013-07-01 MED ORDER — SODIUM CHLORIDE 0.9 % IV SOLN
Freq: Once | INTRAVENOUS | Status: AC
  Administered 2013-07-01: 11:00:00 via INTRAVENOUS

## 2013-07-01 MED ORDER — DEXAMETHASONE SODIUM PHOSPHATE 20 MG/5ML IJ SOLN
20.0000 mg | Freq: Once | INTRAMUSCULAR | Status: AC
  Administered 2013-07-01: 20 mg via INTRAVENOUS

## 2013-07-01 MED ORDER — ONDANSETRON 16 MG/50ML IVPB (CHCC)
INTRAVENOUS | Status: AC
  Filled 2013-07-01: qty 16

## 2013-07-01 MED ORDER — SODIUM CHLORIDE 0.9 % IV SOLN
5.0000 mg/kg | Freq: Once | INTRAVENOUS | Status: AC
  Administered 2013-07-01: 450 mg via INTRAVENOUS
  Filled 2013-07-01: qty 18

## 2013-07-01 MED ORDER — FLUOROURACIL CHEMO INJECTION 2.5 GM/50ML
400.0000 mg/m2 | Freq: Once | INTRAVENOUS | Status: AC
  Administered 2013-07-01: 800 mg via INTRAVENOUS
  Filled 2013-07-01: qty 16

## 2013-07-01 MED ORDER — SODIUM CHLORIDE 0.9 % IV SOLN
2400.0000 mg/m2 | INTRAVENOUS | Status: DC
  Administered 2013-07-01: 4850 mg via INTRAVENOUS
  Filled 2013-07-01: qty 97

## 2013-07-01 MED ORDER — ATROPINE SULFATE 0.4 MG/ML IJ SOLN
0.5000 mg | Freq: Once | INTRAMUSCULAR | Status: DC | PRN
  Filled 2013-07-01: qty 1.25

## 2013-07-03 ENCOUNTER — Ambulatory Visit (HOSPITAL_BASED_OUTPATIENT_CLINIC_OR_DEPARTMENT_OTHER): Payer: BC Managed Care – PPO

## 2013-07-03 VITALS — BP 104/69 | HR 56 | Temp 98.2°F

## 2013-07-03 DIAGNOSIS — C189 Malignant neoplasm of colon, unspecified: Secondary | ICD-10-CM

## 2013-07-03 DIAGNOSIS — C19 Malignant neoplasm of rectosigmoid junction: Secondary | ICD-10-CM

## 2013-07-03 DIAGNOSIS — C787 Secondary malignant neoplasm of liver and intrahepatic bile duct: Secondary | ICD-10-CM

## 2013-07-03 DIAGNOSIS — Z452 Encounter for adjustment and management of vascular access device: Secondary | ICD-10-CM

## 2013-07-03 MED ORDER — SODIUM CHLORIDE 0.9 % IJ SOLN
10.0000 mL | INTRAMUSCULAR | Status: DC | PRN
  Administered 2013-07-03: 10 mL
  Filled 2013-07-03: qty 10

## 2013-07-03 MED ORDER — HEPARIN SOD (PORK) LOCK FLUSH 100 UNIT/ML IV SOLN
500.0000 [IU] | Freq: Once | INTRAVENOUS | Status: AC | PRN
  Administered 2013-07-03: 500 [IU]
  Filled 2013-07-03: qty 5

## 2013-07-15 ENCOUNTER — Inpatient Hospital Stay: Payer: BC Managed Care – PPO

## 2013-07-15 ENCOUNTER — Other Ambulatory Visit: Payer: BC Managed Care – PPO | Admitting: Lab

## 2013-07-15 ENCOUNTER — Ambulatory Visit: Payer: BC Managed Care – PPO | Admitting: Oncology

## 2013-07-17 ENCOUNTER — Other Ambulatory Visit: Payer: Self-pay | Admitting: Oncology

## 2013-07-21 ENCOUNTER — Telehealth: Payer: Self-pay | Admitting: Oncology

## 2013-07-21 NOTE — Telephone Encounter (Signed)
Per reply from FS pt can come on 11/4. Moved 10/29 lb/fu/tx to 11/4 and pump d/c to 11/6. S/w pt re both appt d/t's.

## 2013-07-21 NOTE — Telephone Encounter (Signed)
S/w pt re appts for 10/29. Per pt he is out of town and wants to come next Tuesday or Wednesday. Message to [facility] pt aware.

## 2013-07-22 ENCOUNTER — Other Ambulatory Visit: Payer: BC Managed Care – PPO | Admitting: Lab

## 2013-07-22 ENCOUNTER — Inpatient Hospital Stay: Payer: BC Managed Care – PPO

## 2013-07-22 ENCOUNTER — Ambulatory Visit: Payer: BC Managed Care – PPO | Admitting: Oncology

## 2013-07-28 ENCOUNTER — Telehealth: Payer: Self-pay | Admitting: Oncology

## 2013-07-28 ENCOUNTER — Ambulatory Visit (HOSPITAL_BASED_OUTPATIENT_CLINIC_OR_DEPARTMENT_OTHER): Payer: BC Managed Care – PPO

## 2013-07-28 ENCOUNTER — Encounter: Payer: Self-pay | Admitting: Oncology

## 2013-07-28 ENCOUNTER — Other Ambulatory Visit (HOSPITAL_BASED_OUTPATIENT_CLINIC_OR_DEPARTMENT_OTHER): Payer: BC Managed Care – PPO | Admitting: Lab

## 2013-07-28 ENCOUNTER — Ambulatory Visit (HOSPITAL_BASED_OUTPATIENT_CLINIC_OR_DEPARTMENT_OTHER): Payer: BC Managed Care – PPO | Admitting: Oncology

## 2013-07-28 ENCOUNTER — Telehealth: Payer: Self-pay | Admitting: *Deleted

## 2013-07-28 VITALS — BP 119/74 | HR 67 | Temp 96.7°F | Resp 18 | Ht 73.0 in | Wt 184.8 lb

## 2013-07-28 DIAGNOSIS — C787 Secondary malignant neoplasm of liver and intrahepatic bile duct: Secondary | ICD-10-CM

## 2013-07-28 DIAGNOSIS — C19 Malignant neoplasm of rectosigmoid junction: Secondary | ICD-10-CM

## 2013-07-28 DIAGNOSIS — C189 Malignant neoplasm of colon, unspecified: Secondary | ICD-10-CM

## 2013-07-28 DIAGNOSIS — C7970 Secondary malignant neoplasm of unspecified adrenal gland: Secondary | ICD-10-CM

## 2013-07-28 DIAGNOSIS — Z5111 Encounter for antineoplastic chemotherapy: Secondary | ICD-10-CM

## 2013-07-28 DIAGNOSIS — R369 Urethral discharge, unspecified: Secondary | ICD-10-CM

## 2013-07-28 LAB — COMPREHENSIVE METABOLIC PANEL (CC13)
ALT: 11 U/L (ref 0–55)
AST: 19 U/L (ref 5–34)
Albumin: 3.9 g/dL (ref 3.5–5.0)
Alkaline Phosphatase: 51 U/L (ref 40–150)
Anion Gap: 11 meq/L (ref 3–11)
BUN: 13.6 mg/dL (ref 7.0–26.0)
CO2: 22 meq/L (ref 22–29)
Calcium: 9.2 mg/dL (ref 8.4–10.4)
Chloride: 107 meq/L (ref 98–109)
Creatinine: 0.8 mg/dL (ref 0.7–1.3)
Glucose: 93 mg/dL (ref 70–140)
Potassium: 4 meq/L (ref 3.5–5.1)
Sodium: 140 meq/L (ref 136–145)
Total Bilirubin: 0.68 mg/dL (ref 0.20–1.20)
Total Protein: 7.3 g/dL (ref 6.4–8.3)

## 2013-07-28 LAB — CBC WITH DIFFERENTIAL/PLATELET
BASO%: 0.8 % (ref 0.0–2.0)
EOS%: 6.3 % (ref 0.0–7.0)
MCH: 29.7 pg (ref 27.2–33.4)
MCHC: 34 g/dL (ref 32.0–36.0)
MCV: 87.5 fL (ref 79.3–98.0)
MONO%: 13.5 % (ref 0.0–14.0)
NEUT%: 57.1 % (ref 39.0–75.0)
RDW: 13.8 % (ref 11.0–14.6)
lymph#: 1.2 10*3/uL (ref 0.9–3.3)

## 2013-07-28 MED ORDER — ONDANSETRON 16 MG/50ML IVPB (CHCC)
INTRAVENOUS | Status: AC
  Filled 2013-07-28: qty 16

## 2013-07-28 MED ORDER — DEXAMETHASONE SODIUM PHOSPHATE 20 MG/5ML IJ SOLN
INTRAMUSCULAR | Status: AC
  Filled 2013-07-28: qty 5

## 2013-07-28 MED ORDER — SODIUM CHLORIDE 0.9 % IV SOLN
Freq: Once | INTRAVENOUS | Status: AC
  Administered 2013-07-28: 12:00:00 via INTRAVENOUS

## 2013-07-28 MED ORDER — LEUCOVORIN CALCIUM INJECTION 350 MG
396.0000 mg/m2 | Freq: Once | INTRAVENOUS | Status: AC
  Administered 2013-07-28: 800 mg via INTRAVENOUS
  Filled 2013-07-28: qty 40

## 2013-07-28 MED ORDER — DEXAMETHASONE SODIUM PHOSPHATE 20 MG/5ML IJ SOLN
20.0000 mg | Freq: Once | INTRAMUSCULAR | Status: AC
  Administered 2013-07-28: 20 mg via INTRAVENOUS

## 2013-07-28 MED ORDER — SODIUM CHLORIDE 0.9 % IV SOLN
2400.0000 mg/m2 | INTRAVENOUS | Status: DC
  Administered 2013-07-28: 4850 mg via INTRAVENOUS
  Filled 2013-07-28: qty 97

## 2013-07-28 MED ORDER — ONDANSETRON 16 MG/50ML IVPB (CHCC)
16.0000 mg | Freq: Once | INTRAVENOUS | Status: AC
  Administered 2013-07-28: 16 mg via INTRAVENOUS

## 2013-07-28 MED ORDER — FLUOROURACIL CHEMO INJECTION 2.5 GM/50ML
400.0000 mg/m2 | Freq: Once | INTRAVENOUS | Status: AC
  Administered 2013-07-28: 800 mg via INTRAVENOUS
  Filled 2013-07-28: qty 16

## 2013-07-28 NOTE — Telephone Encounter (Signed)
Per staff message and POF I have scheduled appts.  JMW  

## 2013-07-28 NOTE — Progress Notes (Signed)
Hematology and Oncology Follow Up Visit  Christopher Jimenez [id number] [birth year] 62 y.o. 07/28/2013 10:58 AM  [name], MDRoss, [name], MD   Principle Diagnosis: A 62 year old gentleman diagnosed with a T3 N1 rectosigmoid adenocarcinoma. He had 1/14 lymph nodes involved diagnosed in December 2010. 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  Prior Therapy: 1. Underwent a lower anterior resection done in January 2012. 2. Underwent adjuvant radiation therapy with Xeloda concluded in March 2011. 3. Received adjuvant FOLFOX therapy concluded in July 2012. 4. The patient had involvement of his adrenal gland and underwent adrenalectomy on April 2012 under the care of Dr. Johna Sheriff. 5. He is status post a microwave ablation of the hepatic metastasis done July 06, 2011.  Current therapy: On systemic chemotherapy with FOLFIRI/Avastin started on 07/21/12. He is here for his next cycle.   Interim History:  Christopher Jimenez presents today for a followup visit. 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 Since his last chemotherapy in visit, he has reported more symptoms. He is reporting more fatigue and tiredness. He also reported decrease in his exercise tolerance. And he also reported more bleeding. He reports blood-tinged mucus nasally. He also reported more frequent bleeding with ejaculation. He is not reporting any pain with intercourse or any pelvic discomfort. Is not reporting any fevers or chills or pelvic discharge but does report more bleeding with ejaculation. 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 He is not reporting any abdominal pain. His performance status activity level was slightly less. Had not reported any cough, not  associated with any hemoptysis or hematemesis. No fevers or chills. No headaches, visual changes, or other neurological symptoms. He did report mouth pain with the last treatment.   Medications: I have reviewed the patient's current medications.  Current Outpatient Prescriptions  Medication Sig Dispense Refill  . Alum & Mag Hydroxide-Simeth (MAGIC MOUTHWASH W/LIDOCAINE) SOLN Take 5 mLs by mouth 3 (three) times daily as needed.  240 mL  1   No current facility-administered medications for this visit.    Allergies: No Known Allergies  Past Medical History, Surgical history, Social history, and Family History were reviewed and updated.  Review of Systems:  Remaining ROS negative.  Physical Exam: Blood pressure 119/74, pulse 67, temperature 96.7 F (35.9 C), temperature source Oral, resp. rate 18, height 6\' 1"  (1.854 m), weight 184 lb 12.8 oz (83.825 kg), SpO2 100.00%. ECOG: 0 General appearance: alert, cooperative and no distress Head: Normocephalic, without obvious abnormality, atraumatic Neck: no adenopathy, no carotid bruit, no JVD, supple, symmetrical, trachea midline and thyroid not enlarged, symmetric, no tenderness/mass/nodules Lymph nodes: Cervical, supraclavicular, and axillary nodes normal. Heart:regular rate and rhythm, S1, S2 normal, no murmur, click, rub or gallop Lung:chest clear, no wheezing, rales, normal symmetric air entry, no tachypnea, retractions or cyanosis Abdomen: soft, non-tender, without masses or organomegaly EXT:no erythema, induration, or nodules   Lab Results: Lab Results  Component Value Date   WBC 5.2 07/28/2013   HGB 14.0 07/28/2013   HCT 41.2 07/28/2013   MCV 87.5 07/28/2013   PLT 204 07/28/2013     Chemistry      Component Value Date/Time   NA 141 07/01/2013 0948   NA 142 02/19/2012 0910   NA 140  12/20/2011 1016   K 4.6 07/01/2013 0948   K 5.0* 02/19/2012 0910   K 4.5 12/20/2011 1016   CL 105 02/24/2013 1317   CL 99 02/19/2012 0910   CL 105  12/20/2011 1016   CO2 27 07/01/2013 0948   CO2 30 02/19/2012 0910   CO2 27 12/20/2011 1016   BUN 14.8 07/01/2013 0948   BUN 14 02/19/2012 0910   BUN 18 12/20/2011 1016   CREATININE 0.8 07/01/2013 0948   CREATININE 1.0 02/19/2012 0910   CREATININE 0.90 12/20/2011 1016      Component Value Date/Time   CALCIUM 9.5 07/01/2013 0948   CALCIUM 8.5 02/19/2012 0910   CALCIUM 9.0 12/20/2011 1016   ALKPHOS 47 07/01/2013 0948   ALKPHOS 53 02/19/2012 0910   ALKPHOS 59 12/20/2011 1016   AST 16 07/01/2013 0948   AST 21 02/19/2012 0910   AST 15 12/20/2011 1016   ALT 11 07/01/2013 0948   ALT 24 02/19/2012 0910   ALT 13 12/20/2011 1016   BILITOT 0.81 07/01/2013 0948   BILITOT 0.80 02/19/2012 0910   BILITOT 0.5 12/20/2011 1016      Impression and Plan: This is a pleasant 62 year old gentleman with the following issues:  1. Stage IV colorectal cancer. 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 He had a liver metastasis that has been ablated. His CT scan from 06/01/2013 showed stable disease without progression. He is reporting more accumulation of fatigue and tiredness which is unclear to me whether it is related to disease progression on chemotherapy toxicity. For that reason I will proceed with the next cycle of chemotherapy today and repeat imaging studies in 4 weeks and proceed with chemotherapy after that depending on the results of the CT scan. 2. bloody penile discharge: Unclear etiology at this time. This could be related to avascular and versus anatomical irritation in the prostate into the bladder. For the time being I will hold avascular and and if this continues I will refer him to urology for an evaluation. 3. Port-A-Cath management. PAC in place for chemotherapy.  4. Follow-up. In 4 weeks after CT scan.     Burdette Gergely 11/4/201410:58 AM

## 2013-07-28 NOTE — Patient Instructions (Signed)
[facility] Discharge Instructions for Patients Receiving Chemotherapy  Today you received the following chemotherapy agents Leucovorin and Adrucil  To help prevent nausea and vomiting after your treatment, we encourage you to take your nausea medication.   If you develop nausea and vomiting that is not controlled by your nausea medication, call the clinic.   BELOW ARE SYMPTOMS THAT SHOULD BE REPORTED IMMEDIATELY:  *FEVER GREATER THAN 100.5 F  *CHILLS WITH OR WITHOUT FEVER  NAUSEA AND VOMITING THAT IS NOT CONTROLLED WITH YOUR NAUSEA MEDICATION  *UNUSUAL SHORTNESS OF BREATH  *UNUSUAL BRUISING OR BLEEDING  TENDERNESS IN MOUTH AND THROAT WITH OR WITHOUT PRESENCE OF ULCERS  *URINARY PROBLEMS  *BOWEL PROBLEMS  UNUSUAL RASH Items with * indicate a potential emergency and should be followed up as soon as possible.  Feel free to call the clinic you have any questions or concerns. The clinic phone number is [phone].      

## 2013-07-28 NOTE — Progress Notes (Signed)
Put [employer] form on nurse's desk. °

## 2013-07-28 NOTE — Telephone Encounter (Signed)
Worked 11/4 POF email to MW to add tx for 12/2 AVS and cal to pt CS to call w CT appt on 12/1 shh

## 2013-07-28 NOTE — Telephone Encounter (Signed)
Received call from central re moving 12/1 lb to 2pm to coord w/3pm ct. Central to give pt both times for lb/ct.

## 2013-07-29 ENCOUNTER — Encounter: Payer: Self-pay | Admitting: Oncology

## 2013-07-29 NOTE — Progress Notes (Signed)
Put disability form in registration desk. °

## 2013-07-30 ENCOUNTER — Ambulatory Visit (HOSPITAL_BASED_OUTPATIENT_CLINIC_OR_DEPARTMENT_OTHER): Payer: BC Managed Care – PPO

## 2013-07-30 ENCOUNTER — Other Ambulatory Visit: Payer: Self-pay

## 2013-07-30 VITALS — BP 130/76 | HR 60 | Temp 97.0°F

## 2013-07-30 DIAGNOSIS — C189 Malignant neoplasm of colon, unspecified: Secondary | ICD-10-CM

## 2013-07-30 DIAGNOSIS — C7970 Secondary malignant neoplasm of unspecified adrenal gland: Secondary | ICD-10-CM

## 2013-07-30 DIAGNOSIS — C787 Secondary malignant neoplasm of liver and intrahepatic bile duct: Secondary | ICD-10-CM

## 2013-07-30 DIAGNOSIS — Z452 Encounter for adjustment and management of vascular access device: Secondary | ICD-10-CM

## 2013-07-30 DIAGNOSIS — C19 Malignant neoplasm of rectosigmoid junction: Secondary | ICD-10-CM

## 2013-07-30 MED ORDER — HEPARIN SOD (PORK) LOCK FLUSH 100 UNIT/ML IV SOLN
500.0000 [IU] | Freq: Once | INTRAVENOUS | Status: AC | PRN
  Administered 2013-07-30: 500 [IU]
  Filled 2013-07-30: qty 5

## 2013-07-30 MED ORDER — SODIUM CHLORIDE 0.9 % IJ SOLN
10.0000 mL | INTRAMUSCULAR | Status: DC | PRN
  Administered 2013-07-30: 10 mL
  Filled 2013-07-30: qty 10

## 2013-08-24 ENCOUNTER — Encounter (HOSPITAL_COMMUNITY): Payer: Self-pay

## 2013-08-24 ENCOUNTER — Ambulatory Visit (HOSPITAL_COMMUNITY)
Admission: RE | Admit: 2013-08-24 | Discharge: 2013-08-24 | Disposition: A | Discharge status: Home / Self Care | Payer: BC Managed Care – PPO | Source: Ambulatory Visit | Attending: Oncology | Admitting: Oncology

## 2013-08-24 ENCOUNTER — Other Ambulatory Visit (HOSPITAL_BASED_OUTPATIENT_CLINIC_OR_DEPARTMENT_OTHER): Payer: BC Managed Care – PPO | Admitting: Lab

## 2013-08-24 DIAGNOSIS — N281 Cyst of kidney, acquired: Secondary | ICD-10-CM | POA: Insufficient documentation

## 2013-08-24 DIAGNOSIS — C189 Malignant neoplasm of colon, unspecified: Secondary | ICD-10-CM | POA: Insufficient documentation

## 2013-08-24 DIAGNOSIS — C19 Malignant neoplasm of rectosigmoid junction: Secondary | ICD-10-CM

## 2013-08-24 DIAGNOSIS — C787 Secondary malignant neoplasm of liver and intrahepatic bile duct: Secondary | ICD-10-CM

## 2013-08-24 DIAGNOSIS — C7800 Secondary malignant neoplasm of unspecified lung: Secondary | ICD-10-CM | POA: Insufficient documentation

## 2013-08-24 DIAGNOSIS — K5730 Diverticulosis of large intestine without perforation or abscess without bleeding: Secondary | ICD-10-CM | POA: Insufficient documentation

## 2013-08-24 DIAGNOSIS — R918 Other nonspecific abnormal finding of lung field: Secondary | ICD-10-CM | POA: Insufficient documentation

## 2013-08-24 DIAGNOSIS — C7970 Secondary malignant neoplasm of unspecified adrenal gland: Secondary | ICD-10-CM

## 2013-08-24 DIAGNOSIS — Z933 Colostomy status: Secondary | ICD-10-CM | POA: Insufficient documentation

## 2013-08-24 DIAGNOSIS — R599 Enlarged lymph nodes, unspecified: Secondary | ICD-10-CM | POA: Insufficient documentation

## 2013-08-24 LAB — CBC WITH DIFFERENTIAL/PLATELET
Basophils Absolute: 0.1 10*3/uL (ref 0.0–0.1)
EOS%: 5.5 % (ref 0.0–7.0)
Eosinophils Absolute: 0.4 10*3/uL (ref 0.0–0.5)
HGB: 13.9 g/dL (ref 13.0–17.1)
LYMPH%: 18.2 % (ref 14.0–49.0)
MCH: 29.5 pg (ref 27.2–33.4)
MCV: 89.9 fL (ref 79.3–98.0)
MONO%: 13.1 % (ref 0.0–14.0)
NEUT#: 4.3 10*3/uL (ref 1.5–6.5)
NEUT%: 62.5 % (ref 39.0–75.0)
Platelets: 200 10*3/uL (ref 140–400)
RDW: 14 % (ref 11.0–14.6)

## 2013-08-24 LAB — COMPREHENSIVE METABOLIC PANEL (CC13)
AST: 15 U/L (ref 5–34)
Albumin: 4.1 g/dL (ref 3.5–5.0)
Alkaline Phosphatase: 56 U/L (ref 40–150)
BUN: 15.6 mg/dL (ref 7.0–26.0)
Creatinine: 0.8 mg/dL (ref 0.7–1.3)
Glucose: 94 mg/dl (ref 70–140)
Total Bilirubin: 0.83 mg/dL (ref 0.20–1.20)

## 2013-08-24 MED ORDER — IOHEXOL 300 MG/ML  SOLN
100.0000 mL | Freq: Once | INTRAMUSCULAR | Status: AC | PRN
  Administered 2013-08-24: 100 mL via INTRAVENOUS

## 2013-08-24 MED ORDER — IOHEXOL 300 MG/ML  SOLN
50.0000 mL | Freq: Once | INTRAMUSCULAR | Status: AC | PRN
  Administered 2013-08-24: 50 mL via ORAL

## 2013-08-25 ENCOUNTER — Ambulatory Visit (HOSPITAL_BASED_OUTPATIENT_CLINIC_OR_DEPARTMENT_OTHER): Payer: BC Managed Care – PPO | Admitting: Oncology

## 2013-08-25 ENCOUNTER — Ambulatory Visit (HOSPITAL_BASED_OUTPATIENT_CLINIC_OR_DEPARTMENT_OTHER): Payer: BC Managed Care – PPO

## 2013-08-25 VITALS — BP 127/86 | HR 71 | Temp 97.4°F | Resp 18 | Ht 73.0 in | Wt 182.4 lb

## 2013-08-25 DIAGNOSIS — C7970 Secondary malignant neoplasm of unspecified adrenal gland: Secondary | ICD-10-CM

## 2013-08-25 DIAGNOSIS — C787 Secondary malignant neoplasm of liver and intrahepatic bile duct: Secondary | ICD-10-CM

## 2013-08-25 DIAGNOSIS — C19 Malignant neoplasm of rectosigmoid junction: Secondary | ICD-10-CM

## 2013-08-25 DIAGNOSIS — C189 Malignant neoplasm of colon, unspecified: Secondary | ICD-10-CM

## 2013-08-25 DIAGNOSIS — Z5111 Encounter for antineoplastic chemotherapy: Secondary | ICD-10-CM

## 2013-08-25 MED ORDER — FLUOROURACIL CHEMO INJECTION 2.5 GM/50ML
400.0000 mg/m2 | Freq: Once | INTRAVENOUS | Status: AC
  Administered 2013-08-25: 800 mg via INTRAVENOUS
  Filled 2013-08-25: qty 16

## 2013-08-25 MED ORDER — ATROPINE SULFATE 0.4 MG/ML IJ SOLN
0.5000 mg | Freq: Once | INTRAMUSCULAR | Status: DC | PRN
  Filled 2013-08-25: qty 1.25

## 2013-08-25 MED ORDER — HEPARIN SOD (PORK) LOCK FLUSH 100 UNIT/ML IV SOLN
500.0000 [IU] | Freq: Once | INTRAVENOUS | Status: DC | PRN
  Filled 2013-08-25: qty 5

## 2013-08-25 MED ORDER — DEXAMETHASONE SODIUM PHOSPHATE 20 MG/5ML IJ SOLN
INTRAMUSCULAR | Status: AC
  Filled 2013-08-25: qty 5

## 2013-08-25 MED ORDER — DEXAMETHASONE SODIUM PHOSPHATE 20 MG/5ML IJ SOLN
20.0000 mg | Freq: Once | INTRAMUSCULAR | Status: AC
  Administered 2013-08-25: 20 mg via INTRAVENOUS

## 2013-08-25 MED ORDER — ATROPINE SULFATE 1 MG/ML IJ SOLN
INTRAMUSCULAR | Status: AC
  Filled 2013-08-25: qty 1

## 2013-08-25 MED ORDER — LEUCOVORIN CALCIUM INJECTION 350 MG
396.0000 mg/m2 | Freq: Once | INTRAVENOUS | Status: AC
  Administered 2013-08-25: 800 mg via INTRAVENOUS
  Filled 2013-08-25: qty 40

## 2013-08-25 MED ORDER — ONDANSETRON 16 MG/50ML IVPB (CHCC)
16.0000 mg | Freq: Once | INTRAVENOUS | Status: AC
  Administered 2013-08-25: 16 mg via INTRAVENOUS

## 2013-08-25 MED ORDER — ONDANSETRON 16 MG/50ML IVPB (CHCC)
INTRAVENOUS | Status: AC
  Filled 2013-08-25: qty 16

## 2013-08-25 MED ORDER — SODIUM CHLORIDE 0.9 % IJ SOLN
10.0000 mL | INTRAMUSCULAR | Status: DC | PRN
  Filled 2013-08-25: qty 10

## 2013-08-25 MED ORDER — SODIUM CHLORIDE 0.9 % IV SOLN
2400.0000 mg/m2 | INTRAVENOUS | Status: DC
  Administered 2013-08-25: 4850 mg via INTRAVENOUS
  Filled 2013-08-25: qty 97

## 2013-08-25 MED ORDER — SODIUM CHLORIDE 0.9 % IV SOLN
Freq: Once | INTRAVENOUS | Status: AC
  Administered 2013-08-25: 11:00:00 via INTRAVENOUS

## 2013-08-25 NOTE — Patient Instructions (Signed)
[facility] Discharge Instructions for Patients Receiving Chemotherapy  Today you received the following chemotherapy agents Folfiri  To help prevent nausea and vomiting after your treatment, we encourage you to take your nausea medication as directed   If you develop nausea and vomiting that is not controlled by your nausea medication, call the clinic.   BELOW ARE SYMPTOMS THAT SHOULD BE REPORTED IMMEDIATELY:  *FEVER GREATER THAN 100.5 F  *CHILLS WITH OR WITHOUT FEVER  NAUSEA AND VOMITING THAT IS NOT CONTROLLED WITH YOUR NAUSEA MEDICATION  *UNUSUAL SHORTNESS OF BREATH  *UNUSUAL BRUISING OR BLEEDING  TENDERNESS IN MOUTH AND THROAT WITH OR WITHOUT PRESENCE OF ULCERS  *URINARY PROBLEMS  *BOWEL PROBLEMS  UNUSUAL RASH Items with * indicate a potential emergency and should be followed up as soon as possible.  Feel free to call the clinic you have any questions or concerns. The clinic phone number is [phone].

## 2013-08-25 NOTE — Progress Notes (Signed)
Hematology and Oncology Follow Up Visit  Christopher Jimenez [id number] 1951-04-29 62 y.o. 08/25/2013 10:57 AM  [name], MDRoss, [name], MD   Principle Diagnosis: A 62 year old gentleman diagnosed with a T3 N1 rectosigmoid adenocarcinoma. He had 1/14 lymph nodes involved diagnosed in December 2010. 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  Prior Therapy: 1. Underwent a lower anterior resection done in January 2012. 2. Underwent adjuvant radiation therapy with Xeloda concluded in March 2011. 3. Received adjuvant FOLFOX therapy concluded in July 2012. 4. The patient had involvement of his adrenal gland and underwent adrenalectomy on April 2012 under the care of Dr. Johna Sheriff. 5. He is status post a microwave ablation of the hepatic metastasis done July 06, 2011.  Current therapy: On systemic chemotherapy with FOLFIRI/Avastin started on 07/21/12. He is here for his next cycle.   Interim History:  Christopher Jimenez presents today for a followup visit. 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 Since his last chemotherapy in visit, he has reported more symptoms. He is reporting some fatigue and tiredness. He also reported decrease in his exercise tolerance. He is not reporting any abdominal pain. His performance status activity level was slightly less. Had not reported any cough, not associated with any hemoptysis or hematemesis. No fevers or chills. No headaches, visual changes, or other neurological symptoms. He did report mouth pain with the last treatment. He is reporting that his mucosal bleeding have improved after stopping a Avastin. He reports that he has bought a house on [address] and he is splitting his time they are and his current residence in [place].  Medications: I have reviewed the patient's current  medications.  Current Outpatient Prescriptions  Medication Sig Dispense Refill  . Alum & Mag Hydroxide-Simeth (MAGIC MOUTHWASH W/LIDOCAINE) SOLN Take 5 mLs by mouth 3 (three) times daily as needed.  240 mL  1   No current facility-administered medications for this visit.    Allergies: No Known Allergies  Past Medical History, Surgical history, Social history, and Family History were reviewed and updated.  Review of Systems:  Remaining ROS negative.  Physical Exam: Blood pressure 127/86, pulse 71, temperature 97.4 F (36.3 C), temperature source Oral, resp. rate 18, height 6\' 1"  (1.854 m), weight 182 lb 6.4 oz (82.736 kg), SpO2 99.00%. ECOG: 0 General appearance: alert, cooperative and no distress Head: Normocephalic, without obvious abnormality, atraumatic Neck: no adenopathy, no carotid bruit, no JVD, supple, symmetrical, trachea midline and thyroid not enlarged, symmetric, no tenderness/mass/nodules Lymph nodes: Cervical, supraclavicular, and axillary nodes normal. Heart:regular rate and rhythm, S1, S2 normal, no murmur, click, rub or gallop Lung:chest clear, no wheezing, rales, normal symmetric air entry, no tachypnea, retractions or cyanosis Abdomen: soft, non-tender, without masses or organomegaly EXT:no erythema, induration, or nodules   Lab Results: Lab Results  Component Value Date   WBC 6.9 08/24/2013   HGB 13.9 08/24/2013   HCT 42.4 08/24/2013   MCV 89.9 08/24/2013   PLT 200 08/24/2013     Chemistry      Component Value Date/Time   NA 139 08/24/2013 1427   NA 142 02/19/2012 0910   NA 140 12/20/2011 1016   K 4.3 08/24/2013 1427   K 5.0* 02/19/2012 0910   K 4.5 12/20/2011 1016   CL 105 02/24/2013 1317   CL 99 02/19/2012 0910   CL  105 12/20/2011 1016   CO2 26 08/24/2013 1427   CO2 30 02/19/2012 0910   CO2 27 12/20/2011 1016   BUN 15.6 08/24/2013 1427   BUN 14 02/19/2012 0910   BUN 18 12/20/2011 1016   CREATININE 0.8 08/24/2013 1427   CREATININE 1.0 02/19/2012 0910    CREATININE 0.90 12/20/2011 1016      Component Value Date/Time   CALCIUM 9.9 08/24/2013 1427   CALCIUM 8.5 02/19/2012 0910   CALCIUM 9.0 12/20/2011 1016   ALKPHOS 56 08/24/2013 1427   ALKPHOS 53 02/19/2012 0910   ALKPHOS 59 12/20/2011 1016   AST 15 08/24/2013 1427   AST 21 02/19/2012 0910   AST 15 12/20/2011 1016   ALT 13 08/24/2013 1427   ALT 24 02/19/2012 0910   ALT 13 12/20/2011 1016   BILITOT 0.83 08/24/2013 1427   BILITOT 0.80 02/19/2012 0910   BILITOT 0.5 12/20/2011 1016     EXAM:  CT CHEST, ABDOMEN, AND PELVIS WITH CONTRAST  TECHNIQUE:  Multidetector CT imaging of the chest, abdomen and pelvis was  performed following the standard protocol during bolus  administration of intravenous contrast.  CONTRAST: OMNIPAQUE IOHEXOL 300 MG/ML SOLN  COMPARISON: 06/01/2013  FINDINGS:  CT CHEST FINDINGS  Scattered subcentimeter pulmonary nodules in both lungs are stable,  consistent with pulmonary metastases.  2 poorly defined opacities in the central and posterior right upper  lobe, each measuring approximately 1.6 cm show no significant  changes. As this has not shown interval resolution, neoplasm cannot  be excluded.  Mild right hilar lymphadenopathy measuring 1.3 cm and right  paratracheal lymph node measuring 1.5 cm remains stable. No new  adenopathy identified within the thorax.  No evidence of pleural or pericardial effusion. No evidence of chest  wall mass or suspicious bone lesions  CT ABDOMEN AND PELVIS FINDINGS  Low-attenuation lesion in the right hepatic lobe at site of previous  microwave ablation remains stable measuring 1.3 x 3.1 cm. No other  liver masses are identified.  The gallbladder, pancreas, spleen, adrenal glands, and kidneys  remain unremarkable in appearance except for small bilateral renal  cysts. No evidence of renal masses or hydronephrosis.  No other soft tissue masses or lymphadenopathy identified within the  abdomen or pelvis. Left lower quadrant  colostomy with parastomal  hernia is stable. Presacral soft tissue density also appears stable,  consistent with post treatment change  Diverticulosis again demonstrated, without evidence of  diverticulitis. No other inflammatory process or abnormal fluid  collections identified. Normal appendix is visualized. No suspicious  bone lesions identified.  IMPRESSION:  Persistent ill-defined nodular opacities in the central and  posterior right upper lobe, without significant change. Neoplasm  cannot be excluded. Consider bronchoscopy for further evaluation.  Stable mild right hilar and paratracheal lymphadenopathy.  Stable small sub-cm bilateral pulmonary metastases.  No evidence of recurrent or metastatic carcinoma within the abdomen  or pelvis.  Impression and Plan: This is a pleasant 62 year old gentleman with the following issues:  1. Stage IV colorectal cancer. 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 He had a liver metastasis that has been ablated. His CT scan from 08/24/2013 was discussed today and showed stable disease without progression. For the time being, I will continue on the current dose of chemotherapy and we will schedule it every 3-4 weeks depending on his schedule. 2. Bloody penile discharge: This has resolved after stopping the Avastin and we will continue with chemotherapy without. 3. Port-A-Cath management. PAC in place for chemotherapy.  4. Follow-up. In 3 weeks for the next chemotherapy cycle.      Alyx Mcguirk 12/2/201410:57 AM

## 2013-08-26 ENCOUNTER — Telehealth: Payer: Self-pay | Admitting: Oncology

## 2013-08-26 NOTE — Telephone Encounter (Signed)
could not lv msg no vm set up....mailed pt appt sched and avs with letter

## 2013-08-27 ENCOUNTER — Ambulatory Visit (HOSPITAL_BASED_OUTPATIENT_CLINIC_OR_DEPARTMENT_OTHER): Payer: BC Managed Care – PPO

## 2013-08-27 VITALS — BP 127/84 | HR 69 | Temp 97.1°F | Resp 20

## 2013-08-27 DIAGNOSIS — C189 Malignant neoplasm of colon, unspecified: Secondary | ICD-10-CM

## 2013-08-27 DIAGNOSIS — Z452 Encounter for adjustment and management of vascular access device: Secondary | ICD-10-CM

## 2013-08-27 DIAGNOSIS — C787 Secondary malignant neoplasm of liver and intrahepatic bile duct: Secondary | ICD-10-CM

## 2013-08-27 DIAGNOSIS — C19 Malignant neoplasm of rectosigmoid junction: Secondary | ICD-10-CM

## 2013-08-27 DIAGNOSIS — C7970 Secondary malignant neoplasm of unspecified adrenal gland: Secondary | ICD-10-CM

## 2013-08-27 MED ORDER — SODIUM CHLORIDE 0.9 % IJ SOLN
10.0000 mL | INTRAMUSCULAR | Status: DC | PRN
  Administered 2013-08-27: 10 mL
  Filled 2013-08-27: qty 10

## 2013-08-27 MED ORDER — HEPARIN SOD (PORK) LOCK FLUSH 100 UNIT/ML IV SOLN
500.0000 [IU] | Freq: Once | INTRAVENOUS | Status: AC | PRN
  Administered 2013-08-27: 500 [IU]
  Filled 2013-08-27: qty 5

## 2013-09-08 ENCOUNTER — Encounter: Payer: Self-pay | Admitting: Oncology

## 2013-09-08 NOTE — Progress Notes (Signed)
Put [employer] form on nurse's desk. °

## 2013-09-14 ENCOUNTER — Other Ambulatory Visit (HOSPITAL_BASED_OUTPATIENT_CLINIC_OR_DEPARTMENT_OTHER): Payer: BC Managed Care – PPO

## 2013-09-14 ENCOUNTER — Ambulatory Visit (HOSPITAL_BASED_OUTPATIENT_CLINIC_OR_DEPARTMENT_OTHER): Payer: BC Managed Care – PPO | Admitting: Oncology

## 2013-09-14 ENCOUNTER — Ambulatory Visit (HOSPITAL_BASED_OUTPATIENT_CLINIC_OR_DEPARTMENT_OTHER): Payer: BC Managed Care – PPO

## 2013-09-14 ENCOUNTER — Telehealth: Payer: Self-pay | Admitting: *Deleted

## 2013-09-14 ENCOUNTER — Telehealth: Payer: Self-pay | Admitting: Oncology

## 2013-09-14 ENCOUNTER — Encounter: Payer: Self-pay | Admitting: Oncology

## 2013-09-14 VITALS — BP 133/78 | HR 62 | Temp 97.5°F | Resp 18 | Ht 73.0 in | Wt 186.1 lb

## 2013-09-14 DIAGNOSIS — C189 Malignant neoplasm of colon, unspecified: Secondary | ICD-10-CM

## 2013-09-14 DIAGNOSIS — C787 Secondary malignant neoplasm of liver and intrahepatic bile duct: Secondary | ICD-10-CM

## 2013-09-14 DIAGNOSIS — C19 Malignant neoplasm of rectosigmoid junction: Secondary | ICD-10-CM

## 2013-09-14 DIAGNOSIS — C7970 Secondary malignant neoplasm of unspecified adrenal gland: Secondary | ICD-10-CM

## 2013-09-14 DIAGNOSIS — Z5111 Encounter for antineoplastic chemotherapy: Secondary | ICD-10-CM

## 2013-09-14 LAB — CBC WITH DIFFERENTIAL/PLATELET
BASO%: 1.3 % (ref 0.0–2.0)
Basophils Absolute: 0.1 10*3/uL (ref 0.0–0.1)
HCT: 39.8 % (ref 38.4–49.9)
HGB: 13.6 g/dL (ref 13.0–17.1)
LYMPH%: 24.3 % (ref 14.0–49.0)
MCH: 29.6 pg (ref 27.2–33.4)
MCHC: 34.2 g/dL (ref 32.0–36.0)
MCV: 86.7 fL (ref 79.3–98.0)
MONO#: 0.9 10*3/uL (ref 0.1–0.9)
MONO%: 22.3 % — ABNORMAL HIGH (ref 0.0–14.0)
NEUT%: 41.9 % (ref 39.0–75.0)
Platelets: 183 10*3/uL (ref 140–400)
WBC: 3.9 10*3/uL — ABNORMAL LOW (ref 4.0–10.3)

## 2013-09-14 LAB — COMPREHENSIVE METABOLIC PANEL (CC13)
ALT: 19 U/L (ref 0–55)
AST: 18 U/L (ref 5–34)
Albumin: 3.7 g/dL (ref 3.5–5.0)
Alkaline Phosphatase: 52 U/L (ref 40–150)
Anion Gap: 8 mEq/L (ref 3–11)
CO2: 26 mEq/L (ref 22–29)
Creatinine: 0.8 mg/dL (ref 0.7–1.3)
Total Bilirubin: 1.39 mg/dL — ABNORMAL HIGH (ref 0.20–1.20)

## 2013-09-14 MED ORDER — DEXAMETHASONE SODIUM PHOSPHATE 20 MG/5ML IJ SOLN
INTRAMUSCULAR | Status: AC
  Filled 2013-09-14: qty 5

## 2013-09-14 MED ORDER — LEUCOVORIN CALCIUM INJECTION 350 MG
396.0000 mg/m2 | Freq: Once | INTRAVENOUS | Status: AC
  Administered 2013-09-14: 800 mg via INTRAVENOUS
  Filled 2013-09-14: qty 40

## 2013-09-14 MED ORDER — SODIUM CHLORIDE 0.9 % IV SOLN
Freq: Once | INTRAVENOUS | Status: AC
  Administered 2013-09-14: 11:00:00 via INTRAVENOUS

## 2013-09-14 MED ORDER — FLUOROURACIL CHEMO INJECTION 2.5 GM/50ML
400.0000 mg/m2 | Freq: Once | INTRAVENOUS | Status: AC
  Administered 2013-09-14: 800 mg via INTRAVENOUS
  Filled 2013-09-14: qty 16

## 2013-09-14 MED ORDER — DEXAMETHASONE SODIUM PHOSPHATE 20 MG/5ML IJ SOLN
20.0000 mg | Freq: Once | INTRAMUSCULAR | Status: AC
  Administered 2013-09-14: 20 mg via INTRAVENOUS

## 2013-09-14 MED ORDER — ONDANSETRON 16 MG/50ML IVPB (CHCC)
INTRAVENOUS | Status: AC
  Filled 2013-09-14: qty 16

## 2013-09-14 MED ORDER — SODIUM CHLORIDE 0.9 % IV SOLN
2400.0000 mg/m2 | INTRAVENOUS | Status: DC
  Administered 2013-09-14: 4850 mg via INTRAVENOUS
  Filled 2013-09-14: qty 97

## 2013-09-14 MED ORDER — ONDANSETRON 16 MG/50ML IVPB (CHCC)
16.0000 mg | Freq: Once | INTRAVENOUS | Status: AC
  Administered 2013-09-14: 16 mg via INTRAVENOUS

## 2013-09-14 NOTE — Progress Notes (Signed)
Hematology and Oncology Follow Up Visit  Christopher Jimenez [id number] Mar 19, 1951 62 y.o. 09/14/2013 10:32 AM  Christopher Jimenez, MDRoss, Christopher Diener, MD   Principle Diagnosis: A 62 year old gentleman diagnosed with a T3 N1 rectosigmoid adenocarcinoma. He had 1/14 lymph nodes involved diagnosed in December 2010. 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  Prior Therapy: 1. Underwent a lower anterior resection done in January 2012. 2. Underwent adjuvant radiation therapy with Xeloda concluded in March 2011. 3. Received adjuvant FOLFOX therapy concluded in July 2012. 4. The patient had involvement of his adrenal gland and underwent adrenalectomy on April 2012 under the care of Dr. Johna Jimenez. 5. He is status post a microwave ablation of the hepatic metastasis done July 06, 2011.  Current therapy: On systemic chemotherapy with FOLFIRI/Avastin started on 07/21/12. He is here for his next cycle. Irinotecan was stopped in September 2014 due to fatigue. Avastin was stopped in November 2014 due to bleeding.  Interim History:  Christopher Jimenez presents today for a followup visit. 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 Since his last chemotherapy in visit, he has has more fatigue. He has to rest frequently. He also reported decrease in his exercise tolerance. He is not reporting any abdominal pain. His performance status activity level was slightly less. Had not reported any cough, not associated with any hemoptysis or hematemesis. No fevers or chills. No headaches, visual changes, or other neurological symptoms. He did report mouth pain with the last treatment. He is living at [facility] and he is splitting his time they are and his current residence in [place].  Medications: I have reviewed the patient's current medications.  Current  Outpatient Prescriptions  Medication Sig Dispense Refill  . Alum & Mag Hydroxide-Simeth (MAGIC MOUTHWASH W/LIDOCAINE) SOLN Take 5 mLs by mouth 3 (three) times daily as needed.  240 mL  1   No current facility-administered medications for this visit.    Allergies: No Known Allergies  Past Medical History, Surgical history, Social history, and Family History were reviewed and updated.  Review of Systems:  Remaining ROS negative.  Physical Exam: Blood pressure 133/78, pulse 62, temperature 97.5 F (36.4 C), temperature source Oral, resp. rate 18, height 6\' 1"  (1.854 m), weight 186 lb 1.6 oz (84.414 kg), SpO2 98.00%. ECOG: 1 General appearance: alert, cooperative and no distress Head: Normocephalic, without obvious abnormality, atraumatic Neck: no adenopathy, no carotid bruit, no JVD, supple, symmetrical, trachea midline and thyroid not enlarged, symmetric, no tenderness/mass/nodules Lymph nodes: Cervical, supraclavicular, and axillary nodes normal. Heart:regular rate and rhythm, S1, S2 normal, no murmur, click, rub or gallop Lung:chest clear, no wheezing, rales, normal symmetric air entry, no tachypnea, retractions or cyanosis Abdomen: soft, non-tender, without masses or organomegaly EXT:no erythema, induration, or nodules   Lab Results: Lab Results  Component Value Date   WBC 3.9* 09/14/2013   HGB 13.6 09/14/2013   HCT 39.8 09/14/2013   MCV 86.7 09/14/2013   PLT 183 09/14/2013     Chemistry      Component Value Date/Time   NA 139 08/24/2013 1427   NA 142 02/19/2012 0910   NA 140 12/20/2011 1016   K 4.3 08/24/2013 1427   K 5.0* 02/19/2012 0910   K 4.5 12/20/2011 1016   CL 105 02/24/2013 1317   CL 99 02/19/2012 0910   CL 105 12/20/2011 1016  CO2 26 08/24/2013 1427   CO2 30 02/19/2012 0910   CO2 27 12/20/2011 1016   BUN 15.6 08/24/2013 1427   BUN 14 02/19/2012 0910   BUN 18 12/20/2011 1016   CREATININE 0.8 08/24/2013 1427   CREATININE 1.0 02/19/2012 0910   CREATININE 0.90  12/20/2011 1016      Component Value Date/Time   CALCIUM 9.9 08/24/2013 1427   CALCIUM 8.5 02/19/2012 0910   CALCIUM 9.0 12/20/2011 1016   ALKPHOS 56 08/24/2013 1427   ALKPHOS 53 02/19/2012 0910   ALKPHOS 59 12/20/2011 1016   AST 15 08/24/2013 1427   AST 21 02/19/2012 0910   AST 15 12/20/2011 1016   ALT 13 08/24/2013 1427   ALT 24 02/19/2012 0910   ALT 13 12/20/2011 1016   BILITOT 0.83 08/24/2013 1427   BILITOT 0.80 02/19/2012 0910   BILITOT 0.5 12/20/2011 1016      Impression and Plan: This is a pleasant 62 year old gentleman with the following issues:  1. Stage IV colorectal cancer. 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 He had a liver metastasis that has been ablated. His CT scan from 08/24/2013 showed stable disease without progression. For the time being, I will continue on the current dose of chemotherapy and we will schedule it every 3-4 weeks depending on his schedule. 2. Bloody penile discharge: This has resolved after stopping the Avastin and we will continue with chemotherapy without. 3. Port-A-Cath management. PAC in place for chemotherapy.  4. Follow-up. In 3 weeks for the next chemotherapy cycle.      Christopher Jimenez 12/22/201410:32 AM

## 2013-09-14 NOTE — Telephone Encounter (Signed)
gv and printed appt sched and avs for pt for DEC and Jan 2015....sent emailed ot MW to add tx.

## 2013-09-14 NOTE — Patient Instructions (Signed)
[facility] Discharge Instructions for Patients Receiving Chemotherapy  Today you received the following chemotherapy agents :  Leucovorin,  Fluorouracil.  To help prevent nausea and vomiting after your treatment, we encourage you to take your nausea medication as instructed by your physician.   If you develop nausea and vomiting that is not controlled by your nausea medication, call the clinic.   BELOW ARE SYMPTOMS THAT SHOULD BE REPORTED IMMEDIATELY:  *FEVER GREATER THAN 100.5 F  *CHILLS WITH OR WITHOUT FEVER  NAUSEA AND VOMITING THAT IS NOT CONTROLLED WITH YOUR NAUSEA MEDICATION  *UNUSUAL SHORTNESS OF BREATH  *UNUSUAL BRUISING OR BLEEDING  TENDERNESS IN MOUTH AND THROAT WITH OR WITHOUT PRESENCE OF ULCERS  *URINARY PROBLEMS  *BOWEL PROBLEMS  UNUSUAL RASH Items with * indicate a potential emergency and should be followed up as soon as possible.  Feel free to call the clinic you have any questions or concerns. The clinic phone number is [phone].

## 2013-09-14 NOTE — Telephone Encounter (Signed)
Per staff message and POF I have scheduled appts.  JMW  

## 2013-09-16 ENCOUNTER — Ambulatory Visit (HOSPITAL_BASED_OUTPATIENT_CLINIC_OR_DEPARTMENT_OTHER): Payer: BC Managed Care – PPO

## 2013-09-16 VITALS — BP 111/68 | HR 72 | Temp 97.6°F | Resp 20

## 2013-09-16 DIAGNOSIS — C7970 Secondary malignant neoplasm of unspecified adrenal gland: Secondary | ICD-10-CM

## 2013-09-16 DIAGNOSIS — C19 Malignant neoplasm of rectosigmoid junction: Secondary | ICD-10-CM

## 2013-09-16 DIAGNOSIS — Z452 Encounter for adjustment and management of vascular access device: Secondary | ICD-10-CM

## 2013-09-16 DIAGNOSIS — C787 Secondary malignant neoplasm of liver and intrahepatic bile duct: Secondary | ICD-10-CM

## 2013-09-16 DIAGNOSIS — C189 Malignant neoplasm of colon, unspecified: Secondary | ICD-10-CM

## 2013-09-16 MED ORDER — HEPARIN SOD (PORK) LOCK FLUSH 100 UNIT/ML IV SOLN
500.0000 [IU] | Freq: Once | INTRAVENOUS | Status: AC | PRN
  Administered 2013-09-16: 500 [IU]
  Filled 2013-09-16: qty 5

## 2013-09-16 MED ORDER — SODIUM CHLORIDE 0.9 % IJ SOLN
10.0000 mL | INTRAMUSCULAR | Status: DC | PRN
  Administered 2013-09-16: 10 mL
  Filled 2013-09-16: qty 10

## 2013-09-30 ENCOUNTER — Encounter: Payer: Self-pay | Admitting: Oncology

## 2013-09-30 ENCOUNTER — Telehealth: Payer: Self-pay | Admitting: Oncology

## 2013-09-30 NOTE — Telephone Encounter (Signed)
s.w. pt and r/s appts per pt request.

## 2013-10-01 ENCOUNTER — Ambulatory Visit: Payer: BC Managed Care – PPO | Admitting: Oncology

## 2013-10-01 ENCOUNTER — Encounter: Payer: Self-pay | Admitting: *Deleted

## 2013-10-01 ENCOUNTER — Inpatient Hospital Stay: Payer: BC Managed Care – PPO

## 2013-10-01 ENCOUNTER — Other Ambulatory Visit: Payer: BC Managed Care – PPO

## 2013-10-01 NOTE — Progress Notes (Signed)
Per dr Alen Blew, okay to cancel chemo appt for  10/01/13. Keep regularly scheduled appt for 10/22/13

## 2013-10-22 ENCOUNTER — Ambulatory Visit (HOSPITAL_BASED_OUTPATIENT_CLINIC_OR_DEPARTMENT_OTHER): Payer: BC Managed Care – PPO | Admitting: Oncology

## 2013-10-22 ENCOUNTER — Ambulatory Visit (HOSPITAL_BASED_OUTPATIENT_CLINIC_OR_DEPARTMENT_OTHER): Payer: BC Managed Care – PPO

## 2013-10-22 ENCOUNTER — Other Ambulatory Visit (HOSPITAL_BASED_OUTPATIENT_CLINIC_OR_DEPARTMENT_OTHER): Payer: BC Managed Care – PPO

## 2013-10-22 ENCOUNTER — Encounter: Payer: Self-pay | Admitting: Oncology

## 2013-10-22 VITALS — BP 131/73 | HR 66 | Temp 97.3°F | Resp 18 | Ht 73.0 in | Wt 189.3 lb

## 2013-10-22 DIAGNOSIS — C19 Malignant neoplasm of rectosigmoid junction: Secondary | ICD-10-CM

## 2013-10-22 DIAGNOSIS — C787 Secondary malignant neoplasm of liver and intrahepatic bile duct: Secondary | ICD-10-CM

## 2013-10-22 DIAGNOSIS — C189 Malignant neoplasm of colon, unspecified: Secondary | ICD-10-CM

## 2013-10-22 DIAGNOSIS — Z5111 Encounter for antineoplastic chemotherapy: Secondary | ICD-10-CM

## 2013-10-22 DIAGNOSIS — C7970 Secondary malignant neoplasm of unspecified adrenal gland: Secondary | ICD-10-CM

## 2013-10-22 LAB — CBC WITH DIFFERENTIAL/PLATELET
BASO%: 0.5 % (ref 0.0–2.0)
Basophils Absolute: 0 10*3/uL (ref 0.0–0.1)
EOS ABS: 0.5 10*3/uL (ref 0.0–0.5)
EOS%: 8.3 % — AB (ref 0.0–7.0)
HCT: 40.5 % (ref 38.4–49.9)
HGB: 13.6 g/dL (ref 13.0–17.1)
LYMPH%: 15.8 % (ref 14.0–49.0)
MCH: 29.7 pg (ref 27.2–33.4)
MCHC: 33.6 g/dL (ref 32.0–36.0)
MCV: 88.4 fL (ref 79.3–98.0)
MONO#: 0.7 10*3/uL (ref 0.1–0.9)
MONO%: 11.2 % (ref 0.0–14.0)
NEUT%: 64.2 % (ref 39.0–75.0)
NEUTROS ABS: 3.8 10*3/uL (ref 1.5–6.5)
Platelets: 200 10*3/uL (ref 140–400)
RBC: 4.58 10*6/uL (ref 4.20–5.82)
RDW: 14.6 % (ref 11.0–14.6)
WBC: 5.9 10*3/uL (ref 4.0–10.3)
lymph#: 0.9 10*3/uL (ref 0.9–3.3)

## 2013-10-22 LAB — COMPREHENSIVE METABOLIC PANEL (CC13)
ALBUMIN: 4.1 g/dL (ref 3.5–5.0)
ALT: 20 U/L (ref 0–55)
AST: 21 U/L (ref 5–34)
Alkaline Phosphatase: 58 U/L (ref 40–150)
Anion Gap: 9 mEq/L (ref 3–11)
BILIRUBIN TOTAL: 1.01 mg/dL (ref 0.20–1.20)
BUN: 14.7 mg/dL (ref 7.0–26.0)
CO2: 26 mEq/L (ref 22–29)
Calcium: 9.6 mg/dL (ref 8.4–10.4)
Chloride: 105 mEq/L (ref 98–109)
Creatinine: 0.8 mg/dL (ref 0.7–1.3)
Glucose: 93 mg/dl (ref 70–140)
Potassium: 4.1 mEq/L (ref 3.5–5.1)
SODIUM: 140 meq/L (ref 136–145)
Total Protein: 7 g/dL (ref 6.4–8.3)

## 2013-10-22 MED ORDER — SODIUM CHLORIDE 0.9 % IJ SOLN
10.0000 mL | INTRAMUSCULAR | Status: DC | PRN
  Filled 2013-10-22: qty 10

## 2013-10-22 MED ORDER — DEXAMETHASONE SODIUM PHOSPHATE 20 MG/5ML IJ SOLN
20.0000 mg | Freq: Once | INTRAMUSCULAR | Status: AC
  Administered 2013-10-22: 20 mg via INTRAVENOUS

## 2013-10-22 MED ORDER — LEUCOVORIN CALCIUM INJECTION 350 MG
396.0000 mg/m2 | Freq: Once | INTRAMUSCULAR | Status: AC
  Administered 2013-10-22: 800 mg via INTRAVENOUS
  Filled 2013-10-22: qty 40

## 2013-10-22 MED ORDER — FLUOROURACIL CHEMO INJECTION 2.5 GM/50ML
400.0000 mg/m2 | Freq: Once | INTRAVENOUS | Status: AC
  Administered 2013-10-22: 800 mg via INTRAVENOUS
  Filled 2013-10-22: qty 16

## 2013-10-22 MED ORDER — ONDANSETRON 16 MG/50ML IVPB (CHCC)
16.0000 mg | Freq: Once | INTRAVENOUS | Status: AC
  Administered 2013-10-22: 16 mg via INTRAVENOUS

## 2013-10-22 MED ORDER — ONDANSETRON 16 MG/50ML IVPB (CHCC)
INTRAVENOUS | Status: AC
  Filled 2013-10-22: qty 16

## 2013-10-22 MED ORDER — DEXAMETHASONE SODIUM PHOSPHATE 20 MG/5ML IJ SOLN
INTRAMUSCULAR | Status: AC
  Filled 2013-10-22: qty 5

## 2013-10-22 MED ORDER — ATROPINE SULFATE 0.4 MG/ML IJ SOLN
0.5000 mg | Freq: Once | INTRAMUSCULAR | Status: DC | PRN
  Filled 2013-10-22: qty 1.25

## 2013-10-22 MED ORDER — SODIUM CHLORIDE 0.9 % IV SOLN
Freq: Once | INTRAVENOUS | Status: AC
  Administered 2013-10-22: 10:00:00 via INTRAVENOUS

## 2013-10-22 MED ORDER — FLUOROURACIL CHEMO INJECTION 5 GM/100ML
2400.0000 mg/m2 | INTRAVENOUS | Status: DC
  Administered 2013-10-22: 4850 mg via INTRAVENOUS
  Filled 2013-10-22: qty 97

## 2013-10-22 MED ORDER — SILDENAFIL CITRATE 25 MG PO TABS: 25.0000 mg | ORAL_TABLET | Freq: Every day | ORAL | Status: DC | PRN

## 2013-10-22 MED ORDER — HEPARIN SOD (PORK) LOCK FLUSH 100 UNIT/ML IV SOLN
500.0000 [IU] | Freq: Once | INTRAVENOUS | Status: DC | PRN
  Filled 2013-10-22: qty 5

## 2013-10-22 NOTE — Patient Instructions (Signed)
[facility] Discharge Instructions for Patients Receiving Chemotherapy  Today you received the following chemotherapy agents: Leucovorin. Adrucil (5FU)  To help prevent nausea and vomiting after your treatment, we encourage you to take your nausea medication as prescribed by your physician.   If you develop nausea and vomiting that is not controlled by your nausea medication, call the clinic.   BELOW ARE SYMPTOMS THAT SHOULD BE REPORTED IMMEDIATELY:  *FEVER GREATER THAN 100.5 F  *CHILLS WITH OR WITHOUT FEVER  NAUSEA AND VOMITING THAT IS NOT CONTROLLED WITH YOUR NAUSEA MEDICATION  *UNUSUAL SHORTNESS OF BREATH  *UNUSUAL BRUISING OR BLEEDING  TENDERNESS IN MOUTH AND THROAT WITH OR WITHOUT PRESENCE OF ULCERS  *URINARY PROBLEMS  *BOWEL PROBLEMS  UNUSUAL RASH Items with * indicate a potential emergency and should be followed up as soon as possible.  Feel free to call the clinic you have any questions or concerns. The clinic phone number is [phone].

## 2013-10-22 NOTE — Progress Notes (Signed)
Hematology and Oncology Follow Up Visit  Christopher Jimenez [id number] 31-Aug-1951 63 y.o. 10/22/2013 9:48 AM  Melinda Crutch, MDRoss, Antony Haste, MD   Principle Diagnosis: A 63 year old gentleman diagnosed with a T3 N1 rectosigmoid adenocarcinoma. He had 1/14 lymph nodes involved diagnosed in December 2010. 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  Prior Therapy: 1. Underwent a lower anterior resection done in January 2012. 2. Underwent adjuvant radiation therapy with Xeloda concluded in March 2011. 3. Received adjuvant FOLFOX therapy concluded in July 2012. 4. The patient had involvement of his adrenal gland and underwent adrenalectomy on April 2012 under the care of Dr. Excell Seltzer. 5. He is status post a microwave ablation of the hepatic metastasis done July 06, 2011.  Current therapy: On systemic chemotherapy with FOLFIRI/Avastin started on 07/21/12. He is here for his next cycle. Irinotecan was stopped in September 2014 due to fatigue. Avastin was stopped in November 2014 due to bleeding.  Interim History:  [name] presents today for a followup visit. 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 Since his last chemotherapy in visit, he has been doing relatively well. He has to rest frequently. He also reported decrease in his exercise tolerance. He is not reporting any abdominal pain. His performance status activity level was slightly less. Had not reported any cough, not associated with any hemoptysis or hematemesis. No fevers or chills. No headaches, visual changes, or other neurological symptoms. He did report mouth pain with the last treatment. He is living at [employer] [facility] and he is splitting his time they are and his current residence in [place]. He have reported some anxiety and palpitations at times as well as  occasional erectile dysfunction.  Medications: I have reviewed the patient's current medications.  Current Outpatient Prescriptions  Medication Sig Dispense Refill  . Alum & Mag Hydroxide-Simeth (MAGIC MOUTHWASH W/LIDOCAINE) SOLN Take 5 mLs by mouth 3 (three) times daily as needed.  240 mL  1  . sildenafil (VIAGRA) 25 MG tablet Take 1 tablet (25 mg total) by mouth daily as needed for erectile dysfunction.  10 tablet  0   No current facility-administered medications for this visit.    Allergies: No Known Allergies  Past Medical History, Surgical history, Social history, and Family History were reviewed and updated.  Review of Systems:  Remaining ROS negative.  Physical Exam: Blood pressure 131/73, pulse 66, temperature 97.3 F (36.3 C), temperature source Oral, resp. rate 18, height 6\' 1"  (1.854 m), weight 189 lb 4.8 oz (85.866 kg). ECOG: 1 General appearance: alert, cooperative and no distress Head: Normocephalic, without obvious abnormality, atraumatic Neck: no adenopathy, no carotid bruit, no JVD, supple, symmetrical, trachea midline and thyroid not enlarged, symmetric, no tenderness/mass/nodules Lymph nodes: Cervical, supraclavicular, and axillary nodes normal. Heart:regular rate and rhythm, S1, S2 normal, no murmur, click, rub or gallop Lung:chest clear, no wheezing, rales, normal symmetric air entry, no tachypnea, retractions or cyanosis Abdomen: soft, non-tender, without masses or organomegaly EXT:no erythema, induration, or nodules   Lab Results: Lab Results  Component Value Date   WBC 5.9 10/22/2013   HGB 13.6 10/22/2013   HCT 40.5 10/22/2013   MCV 88.4 10/22/2013   PLT 200 10/22/2013     Chemistry      Component Value Date/Time   NA 139 09/14/2013 0952   NA 142 02/19/2012 0910  NA 140 12/20/2011 1016   K 4.2 09/14/2013 0952   K 5.0* 02/19/2012 0910   K 4.5 12/20/2011 1016   CL 105 02/24/2013 1317   CL 99 02/19/2012 0910   CL 105 12/20/2011 1016   CO2 26 09/14/2013  0952   CO2 30 02/19/2012 0910   CO2 27 12/20/2011 1016   BUN 13.7 09/14/2013 0952   BUN 14 02/19/2012 0910   BUN 18 12/20/2011 1016   CREATININE 0.8 09/14/2013 0952   CREATININE 1.0 02/19/2012 0910   CREATININE 0.90 12/20/2011 1016      Component Value Date/Time   CALCIUM 9.0 09/14/2013 0952   CALCIUM 8.5 02/19/2012 0910   CALCIUM 9.0 12/20/2011 1016   ALKPHOS 52 09/14/2013 0952   ALKPHOS 53 02/19/2012 0910   ALKPHOS 59 12/20/2011 1016   AST 18 09/14/2013 0952   AST 21 02/19/2012 0910   AST 15 12/20/2011 1016   ALT 19 09/14/2013 0952   ALT 24 02/19/2012 0910   ALT 13 12/20/2011 1016   BILITOT 1.39* 09/14/2013 0952   BILITOT 0.80 02/19/2012 0910   BILITOT 0.5 12/20/2011 1016      Impression and Plan: This is a pleasant 63 year old gentleman with the following issues:  1. Stage IV colorectal cancer. 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 He had a liver metastasis that has been ablated. His CT scan from 08/24/2013 showed stable disease without progression. For the time being, I will continue on the current dose of chemotherapy and we will schedule it every 3 weeks depending on his schedule. 2. Bloody penile discharge: This has resolved after stopping the Avastin and we will continue with chemotherapy without. 3. Port-A-Cath management. PAC in place for chemotherapy.  4. Follow-up. In 3 weeks for the next chemotherapy cycle.      Christopher Jimenez 1/29/20159:48 AM

## 2013-10-23 ENCOUNTER — Telehealth: Payer: Self-pay | Admitting: Oncology

## 2013-10-23 ENCOUNTER — Telehealth: Payer: Self-pay | Admitting: *Deleted

## 2013-10-23 NOTE — Telephone Encounter (Signed)
, °

## 2013-10-23 NOTE — Telephone Encounter (Signed)
Per staff message and POF I have scheduled appts.  JMW  

## 2013-10-23 NOTE — Telephone Encounter (Signed)
s.w. pt and advised on Feb appt.Marland KitchenMarland Kitchen

## 2013-10-24 ENCOUNTER — Ambulatory Visit (HOSPITAL_BASED_OUTPATIENT_CLINIC_OR_DEPARTMENT_OTHER): Payer: BC Managed Care – PPO

## 2013-10-24 VITALS — BP 131/75 | HR 80 | Temp 97.5°F | Resp 18

## 2013-10-24 DIAGNOSIS — Z452 Encounter for adjustment and management of vascular access device: Secondary | ICD-10-CM

## 2013-10-24 DIAGNOSIS — C189 Malignant neoplasm of colon, unspecified: Secondary | ICD-10-CM

## 2013-10-24 DIAGNOSIS — C19 Malignant neoplasm of rectosigmoid junction: Secondary | ICD-10-CM

## 2013-10-24 DIAGNOSIS — C7970 Secondary malignant neoplasm of unspecified adrenal gland: Secondary | ICD-10-CM

## 2013-10-24 DIAGNOSIS — C787 Secondary malignant neoplasm of liver and intrahepatic bile duct: Secondary | ICD-10-CM

## 2013-10-24 MED ORDER — HEPARIN SOD (PORK) LOCK FLUSH 100 UNIT/ML IV SOLN
500.0000 [IU] | Freq: Once | INTRAVENOUS | Status: AC | PRN
  Administered 2013-10-24: 500 [IU]
  Filled 2013-10-24: qty 5

## 2013-10-24 MED ORDER — SODIUM CHLORIDE 0.9 % IJ SOLN
10.0000 mL | INTRAMUSCULAR | Status: DC | PRN
  Administered 2013-10-24: 10 mL
  Filled 2013-10-24: qty 10

## 2013-10-24 NOTE — Patient Instructions (Signed)

## 2013-11-12 ENCOUNTER — Telehealth: Payer: Self-pay | Admitting: *Deleted

## 2013-11-12 ENCOUNTER — Other Ambulatory Visit: Payer: BC Managed Care – PPO

## 2013-11-12 ENCOUNTER — Ambulatory Visit: Payer: BC Managed Care – PPO

## 2013-11-12 ENCOUNTER — Inpatient Hospital Stay: Payer: BC Managed Care – PPO

## 2013-11-12 ENCOUNTER — Ambulatory Visit: Payer: BC Managed Care – PPO | Admitting: Oncology

## 2013-11-12 NOTE — Telephone Encounter (Signed)
Per staff message and POF I have scheduled appts.  JMW  

## 2013-11-13 ENCOUNTER — Telehealth: Payer: Self-pay | Admitting: Oncology

## 2013-11-13 NOTE — Telephone Encounter (Signed)
Talked to pt and gave him apppt for lab,md and chemo on 11/26/13

## 2013-11-17 ENCOUNTER — Other Ambulatory Visit (HOSPITAL_COMMUNITY): Payer: Self-pay | Admitting: Interventional Radiology

## 2013-11-17 DIAGNOSIS — C787 Secondary malignant neoplasm of liver and intrahepatic bile duct: Secondary | ICD-10-CM

## 2013-11-17 DIAGNOSIS — C189 Malignant neoplasm of colon, unspecified: Secondary | ICD-10-CM

## 2013-11-24 ENCOUNTER — Telehealth: Payer: Self-pay | Admitting: Radiology

## 2013-11-24 NOTE — Telephone Encounter (Signed)
Patient states that he will be moving to [place], [place].  He plans to transfer his care to [place], [place].  He has a follow up appointment with Dr Alen Blew on 11/26/2013 and will contact our office regarding his plans for medical care and follow up.  Tamica Covell Riki Rusk, [place] 11/24/2013 10:29 AM

## 2013-11-25 ENCOUNTER — Telehealth: Payer: Self-pay | Admitting: Medical Oncology

## 2013-11-25 ENCOUNTER — Other Ambulatory Visit: Payer: Self-pay | Admitting: Medical Oncology

## 2013-11-25 NOTE — Telephone Encounter (Signed)
Patient called stating he needs to r/s his appts from tomorrow to the next earliest availability.  Sched for labs/MD/Tx 03/05, with pump d/c 11/28/2013.  Will review with MD of avail times for MD appt.

## 2013-11-26 ENCOUNTER — Ambulatory Visit: Payer: BC Managed Care – PPO | Admitting: Oncology

## 2013-11-26 ENCOUNTER — Other Ambulatory Visit: Payer: BC Managed Care – PPO

## 2013-11-26 ENCOUNTER — Telehealth: Payer: Self-pay | Admitting: *Deleted

## 2013-11-26 ENCOUNTER — Inpatient Hospital Stay: Payer: BC Managed Care – PPO

## 2013-11-26 NOTE — Telephone Encounter (Signed)
Per staff message and POF I have scheduled appts.  JMW  

## 2013-11-26 NOTE — Telephone Encounter (Signed)
sw pt gv appt for 12/03/13 w/ labs@9 :30am, ov @ 10am, tx to follow. i emailed MW to add the tx. Pt is aware of his pump d/c for 12/05/13@1pm ...Marland KitchenMarland Kitchentd

## 2013-12-03 ENCOUNTER — Telehealth: Payer: Self-pay | Admitting: Oncology

## 2013-12-03 ENCOUNTER — Encounter: Payer: Self-pay | Admitting: Oncology

## 2013-12-03 ENCOUNTER — Ambulatory Visit (HOSPITAL_BASED_OUTPATIENT_CLINIC_OR_DEPARTMENT_OTHER): Payer: BC Managed Care – PPO | Admitting: Oncology

## 2013-12-03 ENCOUNTER — Ambulatory Visit (HOSPITAL_BASED_OUTPATIENT_CLINIC_OR_DEPARTMENT_OTHER): Payer: BC Managed Care – PPO

## 2013-12-03 ENCOUNTER — Other Ambulatory Visit (HOSPITAL_BASED_OUTPATIENT_CLINIC_OR_DEPARTMENT_OTHER): Payer: BC Managed Care – PPO

## 2013-12-03 VITALS — BP 135/73 | HR 72 | Temp 97.8°F | Resp 18 | Ht 73.0 in | Wt 192.8 lb

## 2013-12-03 DIAGNOSIS — C189 Malignant neoplasm of colon, unspecified: Secondary | ICD-10-CM

## 2013-12-03 DIAGNOSIS — C19 Malignant neoplasm of rectosigmoid junction: Secondary | ICD-10-CM

## 2013-12-03 DIAGNOSIS — Z5111 Encounter for antineoplastic chemotherapy: Secondary | ICD-10-CM

## 2013-12-03 DIAGNOSIS — C787 Secondary malignant neoplasm of liver and intrahepatic bile duct: Secondary | ICD-10-CM

## 2013-12-03 DIAGNOSIS — C7970 Secondary malignant neoplasm of unspecified adrenal gland: Secondary | ICD-10-CM

## 2013-12-03 LAB — CBC WITH DIFFERENTIAL/PLATELET
BASO%: 0.6 % (ref 0.0–2.0)
Basophils Absolute: 0 10*3/uL (ref 0.0–0.1)
EOS%: 7.6 % — AB (ref 0.0–7.0)
Eosinophils Absolute: 0.5 10*3/uL (ref 0.0–0.5)
HCT: 38.2 % — ABNORMAL LOW (ref 38.4–49.9)
HGB: 13 g/dL (ref 13.0–17.1)
LYMPH%: 16.6 % (ref 14.0–49.0)
MCH: 30 pg (ref 27.2–33.4)
MCHC: 34 g/dL (ref 32.0–36.0)
MCV: 88.2 fL (ref 79.3–98.0)
MONO#: 1 10*3/uL — ABNORMAL HIGH (ref 0.1–0.9)
MONO%: 14.3 % — AB (ref 0.0–14.0)
NEUT#: 4.1 10*3/uL (ref 1.5–6.5)
NEUT%: 60.9 % (ref 39.0–75.0)
Platelets: 199 10*3/uL (ref 140–400)
RBC: 4.33 10*6/uL (ref 4.20–5.82)
RDW: 14.3 % (ref 11.0–14.6)
WBC: 6.7 10*3/uL (ref 4.0–10.3)
lymph#: 1.1 10*3/uL (ref 0.9–3.3)
nRBC: 0 % (ref 0–0)

## 2013-12-03 LAB — COMPREHENSIVE METABOLIC PANEL (CC13)
ALK PHOS: 87 U/L (ref 40–150)
ALT: 24 U/L (ref 0–55)
AST: 25 U/L (ref 5–34)
Albumin: 4 g/dL (ref 3.5–5.0)
Anion Gap: 9 mEq/L (ref 3–11)
BILIRUBIN TOTAL: 1.15 mg/dL (ref 0.20–1.20)
BUN: 18.1 mg/dL (ref 7.0–26.0)
CO2: 25 mEq/L (ref 22–29)
Calcium: 9.6 mg/dL (ref 8.4–10.4)
Chloride: 105 mEq/L (ref 98–109)
Creatinine: 0.8 mg/dL (ref 0.7–1.3)
GLUCOSE: 86 mg/dL (ref 70–140)
Potassium: 3.9 mEq/L (ref 3.5–5.1)
Sodium: 138 mEq/L (ref 136–145)
Total Protein: 7 g/dL (ref 6.4–8.3)

## 2013-12-03 MED ORDER — LEUCOVORIN CALCIUM INJECTION 350 MG
396.0000 mg/m2 | Freq: Once | INTRAVENOUS | Status: AC
  Administered 2013-12-03: 800 mg via INTRAVENOUS
  Filled 2013-12-03: qty 40

## 2013-12-03 MED ORDER — SODIUM CHLORIDE 0.9 % IV SOLN
2400.0000 mg/m2 | INTRAVENOUS | Status: DC
  Administered 2013-12-03: 4850 mg via INTRAVENOUS
  Filled 2013-12-03: qty 97

## 2013-12-03 MED ORDER — ATROPINE SULFATE 0.4 MG/ML IJ SOLN
0.5000 mg | Freq: Once | INTRAMUSCULAR | Status: DC | PRN
  Filled 2013-12-03: qty 1.25

## 2013-12-03 MED ORDER — DEXAMETHASONE SODIUM PHOSPHATE 20 MG/5ML IJ SOLN
20.0000 mg | Freq: Once | INTRAMUSCULAR | Status: AC
  Administered 2013-12-03: 20 mg via INTRAVENOUS

## 2013-12-03 MED ORDER — ONDANSETRON 16 MG/50ML IVPB (CHCC)
16.0000 mg | Freq: Once | INTRAVENOUS | Status: AC
  Administered 2013-12-03: 16 mg via INTRAVENOUS

## 2013-12-03 MED ORDER — DEXAMETHASONE SODIUM PHOSPHATE 20 MG/5ML IJ SOLN
INTRAMUSCULAR | Status: AC
  Filled 2013-12-03: qty 5

## 2013-12-03 MED ORDER — ONDANSETRON 16 MG/50ML IVPB (CHCC)
INTRAVENOUS | Status: AC
  Filled 2013-12-03: qty 16

## 2013-12-03 MED ORDER — HEPARIN SOD (PORK) LOCK FLUSH 100 UNIT/ML IV SOLN
500.0000 [IU] | Freq: Once | INTRAVENOUS | Status: DC | PRN
  Filled 2013-12-03: qty 5

## 2013-12-03 MED ORDER — FLUOROURACIL CHEMO INJECTION 2.5 GM/50ML
400.0000 mg/m2 | Freq: Once | INTRAVENOUS | Status: AC
  Administered 2013-12-03: 800 mg via INTRAVENOUS
  Filled 2013-12-03: qty 16

## 2013-12-03 MED ORDER — SODIUM CHLORIDE 0.9 % IV SOLN
Freq: Once | INTRAVENOUS | Status: AC
  Administered 2013-12-03: 11:00:00 via INTRAVENOUS

## 2013-12-03 MED ORDER — SODIUM CHLORIDE 0.9 % IJ SOLN
10.0000 mL | INTRAMUSCULAR | Status: DC | PRN
  Filled 2013-12-03: qty 10

## 2013-12-03 NOTE — Progress Notes (Signed)
Hematology and Oncology Follow Up Visit  Christopher Jimenez [id number] 09-21-1951 63 y.o. 12/03/2013 10:59 AM  Melinda Crutch, MDRoss, Antony Haste, MD   Principle Diagnosis: A 63 year old gentleman diagnosed with a T3 N1 rectosigmoid adenocarcinoma. He had 1/14 lymph nodes involved diagnosed in December 2010. 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  Prior Therapy: 1. Underwent a lower anterior resection done in January 2012. 2. Underwent adjuvant radiation therapy with Xeloda concluded in March 2011. 3. Received adjuvant FOLFOX therapy concluded in July 2012. 4. The patient had involvement of his adrenal gland and underwent adrenalectomy on April 2012 under the care of Dr. Excell Seltzer. 5. He is status post a microwave ablation of the hepatic metastasis done July 06, 2011.  Current therapy: On systemic chemotherapy with FOLFIRI/Avastin started on 07/21/12. He is here for his next cycle. Irinotecan was stopped in September 2014 due to fatigue. Avastin was stopped in November 2014 due to bleeding.  Interim History:  Christopher Jimenez presents today for a followup visit. 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 Since his last chemotherapy in visit, he has been doing relatively well. He reported improved exercise tolerance. He is not reporting any abdominal pain. His performance status activity level was slightly less. Had not reported any cough, not associated with any hemoptysis or hematemesis. No fevers or chills. No headaches, visual changes, or other neurological symptoms.  He is living at [employer] and he is splitting his time they are and his current residence in [place]. He have reported some anxiety and palpitations at times as well as occasional erectile dysfunction which was helped with Viagra.  Medications: I have  reviewed the patient's current medications.  Current Outpatient Prescriptions  Medication Sig Dispense Refill  . Alum & Mag Hydroxide-Simeth (MAGIC MOUTHWASH W/LIDOCAINE) SOLN Take 5 mLs by mouth 3 (three) times daily as needed.  240 mL  1  . sildenafil (VIAGRA) 25 MG tablet Take 1 tablet (25 mg total) by mouth daily as needed for erectile dysfunction.  10 tablet  0   No current facility-administered medications for this visit.   Facility-Administered Medications Ordered in Other Visits  Medication Dose Route Frequency Provider Last Rate Last Dose  . 0.9 %  sodium chloride infusion   Intravenous Once Wyatt Portela, MD      . atropine injection 0.5 mg  0.5 mg Intravenous Once PRN Wyatt Portela, MD      . Dexamethasone Sodium Phosphate (DECADRON) injection 20 mg  20 mg Intravenous Once Wyatt Portela, MD      . fluorouracil (ADRUCIL) 4,850 mg in sodium chloride 0.9 % 150 mL chemo infusion  2,400 mg/m2 (Treatment Plan Actual) Intravenous 1 day or 1 dose Wyatt Portela, MD      . fluorouracil (ADRUCIL) chemo injection 800 mg  400 mg/m2 (Treatment Plan Actual) Intravenous Once Wyatt Portela, MD      . heparin lock flush 100 unit/mL  500 Units Intracatheter Once PRN Wyatt Portela, MD      . leucovorin 800 mg in dextrose 5 % 250 mL infusion  396 mg/m2 (Treatment Plan Actual) Intravenous Once Wyatt Portela, MD      . ondansetron (ZOFRAN) IVPB 16 mg  16 mg Intravenous Once Wyatt Portela, MD      . sodium chloride 0.9 % injection  10 mL  10 mL Intracatheter PRN Wyatt Portela, MD        Allergies: No Known Allergies  Past Medical History, Surgical history, Social history, and Family History were reviewed and updated.  Review of Systems:  Remaining ROS negative.  Physical Exam: Blood pressure 135/73, pulse 72, temperature 97.8 F (36.6 C), temperature source Oral, resp. rate 18, height 6\' 1"  (1.854 m), weight 192 lb 12.8 oz (87.454 kg), SpO2 96.00%. ECOG: 1 General appearance: alert,  cooperative and no distress Head: Normocephalic, without obvious abnormality, atraumatic Neck: no adenopathy, no carotid bruit, no JVD, supple, symmetrical, trachea midline and thyroid not enlarged, symmetric, no tenderness/mass/nodules Lymph nodes: Cervical, supraclavicular, and axillary nodes normal. Heart:regular rate and rhythm, S1, S2 normal, no murmur, click, rub or gallop Lung:chest clear, no wheezing, rales, normal symmetric air entry, no tachypnea, retractions or cyanosis Abdomen: soft, non-tender, without masses or organomegaly EXT:no erythema, induration, or nodules   Lab Results: Lab Results  Component Value Date   WBC 6.7 12/03/2013   HGB 13.0 12/03/2013   HCT 38.2* 12/03/2013   MCV 88.2 12/03/2013   PLT 199 12/03/2013     Chemistry      Component Value Date/Time   NA 140 10/22/2013 0906   NA 142 02/19/2012 0910   NA 140 12/20/2011 1016   K 4.1 10/22/2013 0906   K 5.0* 02/19/2012 0910   K 4.5 12/20/2011 1016   CL 105 02/24/2013 1317   CL 99 02/19/2012 0910   CL 105 12/20/2011 1016   CO2 26 10/22/2013 0906   CO2 30 02/19/2012 0910   CO2 27 12/20/2011 1016   BUN 14.7 10/22/2013 0906   BUN 14 02/19/2012 0910   BUN 18 12/20/2011 1016   CREATININE 0.8 10/22/2013 0906   CREATININE 1.0 02/19/2012 0910   CREATININE 0.90 12/20/2011 1016      Component Value Date/Time   CALCIUM 9.6 10/22/2013 0906   CALCIUM 8.5 02/19/2012 0910   CALCIUM 9.0 12/20/2011 1016   ALKPHOS 58 10/22/2013 0906   ALKPHOS 53 02/19/2012 0910   ALKPHOS 59 12/20/2011 1016   AST 21 10/22/2013 0906   AST 21 02/19/2012 0910   AST 15 12/20/2011 1016   ALT 20 10/22/2013 0906   ALT 24 02/19/2012 0910   ALT 13 12/20/2011 1016   BILITOT 1.01 10/22/2013 0906   BILITOT 0.80 02/19/2012 0910   BILITOT 0.5 12/20/2011 1016      Impression and Plan: This is a pleasant 63 year old gentleman with the following issues:  1. Stage IV colorectal cancer. 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 He had a liver metastasis that has been  ablated. His CT scan from 08/24/2013 showed stable disease without progression. For the time being, I will continue on the current dose of chemotherapy and we will schedule it every 3 to 4 weeks depending on his schedule. 2. Bloody penile discharge: This has resolved after stopping the Avastin and we will continue with chemotherapy without. 3. Port-A-Cath management. PAC in place for chemotherapy.  4. Follow-up. In 4 weeks for the next chemotherapy cycle.      Keirstyn Aydt 3/12/201510:59 AM

## 2013-12-03 NOTE — Telephone Encounter (Signed)
gv and printed appt sched adn avs forpt for April and May....sed added tx.

## 2013-12-03 NOTE — Patient Instructions (Signed)
[facility] Discharge Instructions for Patients Receiving Chemotherapy  Today you received the following chemotherapy agents leucovorin, 5-FU.   To help prevent nausea and vomiting after your treatment, we encourage you to take your nausea medication as prescribed.    If you develop nausea and vomiting that is not controlled by your nausea medication, call the clinic.   BELOW ARE SYMPTOMS THAT SHOULD BE REPORTED IMMEDIATELY:  *FEVER GREATER THAN 100.5 F  *CHILLS WITH OR WITHOUT FEVER  NAUSEA AND VOMITING THAT IS NOT CONTROLLED WITH YOUR NAUSEA MEDICATION  *UNUSUAL SHORTNESS OF BREATH  *UNUSUAL BRUISING OR BLEEDING  TENDERNESS IN MOUTH AND THROAT WITH OR WITHOUT PRESENCE OF ULCERS  *URINARY PROBLEMS  *BOWEL PROBLEMS  UNUSUAL RASH Items with * indicate a potential emergency and should be followed up as soon as possible.  Feel free to call the clinic should you have any questions or concerns. The clinic phone number is [phone].

## 2013-12-04 ENCOUNTER — Other Ambulatory Visit: Payer: Self-pay | Admitting: Oncology

## 2013-12-04 LAB — CEA: CEA: 268.8 ng/mL — AB (ref 0.0–5.0)

## 2013-12-05 ENCOUNTER — Ambulatory Visit (HOSPITAL_BASED_OUTPATIENT_CLINIC_OR_DEPARTMENT_OTHER): Payer: BC Managed Care – PPO

## 2013-12-05 VITALS — BP 118/75 | HR 58 | Temp 98.3°F

## 2013-12-05 DIAGNOSIS — C19 Malignant neoplasm of rectosigmoid junction: Secondary | ICD-10-CM

## 2013-12-05 DIAGNOSIS — C7970 Secondary malignant neoplasm of unspecified adrenal gland: Secondary | ICD-10-CM

## 2013-12-05 DIAGNOSIS — C787 Secondary malignant neoplasm of liver and intrahepatic bile duct: Secondary | ICD-10-CM

## 2013-12-05 DIAGNOSIS — C189 Malignant neoplasm of colon, unspecified: Secondary | ICD-10-CM

## 2013-12-05 MED ORDER — SODIUM CHLORIDE 0.9 % IJ SOLN
10.0000 mL | INTRAMUSCULAR | Status: DC | PRN
  Administered 2013-12-05: 10 mL
  Filled 2013-12-05: qty 10

## 2013-12-05 MED ORDER — HEPARIN SOD (PORK) LOCK FLUSH 100 UNIT/ML IV SOLN
500.0000 [IU] | Freq: Once | INTRAVENOUS | Status: AC | PRN
  Administered 2013-12-05: 500 [IU]
  Filled 2013-12-05: qty 5

## 2013-12-30 ENCOUNTER — Other Ambulatory Visit: Payer: Self-pay

## 2013-12-31 ENCOUNTER — Ambulatory Visit (HOSPITAL_BASED_OUTPATIENT_CLINIC_OR_DEPARTMENT_OTHER): Payer: BC Managed Care – PPO | Admitting: Physician Assistant

## 2013-12-31 ENCOUNTER — Ambulatory Visit: Payer: BC Managed Care – PPO

## 2013-12-31 ENCOUNTER — Other Ambulatory Visit: Payer: Self-pay

## 2013-12-31 ENCOUNTER — Encounter: Payer: Self-pay | Admitting: Physician Assistant

## 2013-12-31 ENCOUNTER — Inpatient Hospital Stay: Payer: BC Managed Care – PPO

## 2013-12-31 ENCOUNTER — Other Ambulatory Visit (HOSPITAL_BASED_OUTPATIENT_CLINIC_OR_DEPARTMENT_OTHER): Payer: BC Managed Care – PPO

## 2013-12-31 VITALS — BP 123/81 | HR 63 | Temp 97.4°F | Resp 18 | Ht 73.0 in | Wt 192.9 lb

## 2013-12-31 DIAGNOSIS — C19 Malignant neoplasm of rectosigmoid junction: Secondary | ICD-10-CM

## 2013-12-31 DIAGNOSIS — C189 Malignant neoplasm of colon, unspecified: Secondary | ICD-10-CM

## 2013-12-31 DIAGNOSIS — Z95828 Presence of other vascular implants and grafts: Secondary | ICD-10-CM

## 2013-12-31 LAB — CBC WITH DIFFERENTIAL/PLATELET
BASO%: 0.3 % (ref 0.0–2.0)
Basophils Absolute: 0 10*3/uL (ref 0.0–0.1)
EOS ABS: 0.4 10*3/uL (ref 0.0–0.5)
EOS%: 6.5 % (ref 0.0–7.0)
HEMATOCRIT: 38.9 % (ref 38.4–49.9)
HEMOGLOBIN: 13.2 g/dL (ref 13.0–17.1)
LYMPH%: 18.3 % (ref 14.0–49.0)
MCH: 29.9 pg (ref 27.2–33.4)
MCHC: 33.9 g/dL (ref 32.0–36.0)
MCV: 88.2 fL (ref 79.3–98.0)
MONO#: 0.8 10*3/uL (ref 0.1–0.9)
MONO%: 13.2 % (ref 0.0–14.0)
NEUT%: 61.7 % (ref 39.0–75.0)
NEUTROS ABS: 3.6 10*3/uL (ref 1.5–6.5)
PLATELETS: 192 10*3/uL (ref 140–400)
RBC: 4.41 10*6/uL (ref 4.20–5.82)
RDW: 13.9 % (ref 11.0–14.6)
WBC: 5.8 10*3/uL (ref 4.0–10.3)
lymph#: 1.1 10*3/uL (ref 0.9–3.3)

## 2013-12-31 LAB — COMPREHENSIVE METABOLIC PANEL (CC13)
ALK PHOS: 91 U/L (ref 40–150)
ALT: 22 U/L (ref 0–55)
AST: 23 U/L (ref 5–34)
Albumin: 3.9 g/dL (ref 3.5–5.0)
Anion Gap: 9 mEq/L (ref 3–11)
BILIRUBIN TOTAL: 0.57 mg/dL (ref 0.20–1.20)
BUN: 14.4 mg/dL (ref 7.0–26.0)
CO2: 26 mEq/L (ref 22–29)
Calcium: 9.4 mg/dL (ref 8.4–10.4)
Chloride: 107 mEq/L (ref 98–109)
Creatinine: 0.9 mg/dL (ref 0.7–1.3)
GLUCOSE: 96 mg/dL (ref 70–140)
Potassium: 4.1 mEq/L (ref 3.5–5.1)
SODIUM: 141 meq/L (ref 136–145)
TOTAL PROTEIN: 7 g/dL (ref 6.4–8.3)

## 2013-12-31 LAB — CEA: CEA: 287.7 ng/mL — AB (ref 0.0–5.0)

## 2013-12-31 MED ORDER — SODIUM CHLORIDE 0.9 % IJ SOLN
10.0000 mL | INTRAMUSCULAR | Status: DC | PRN
  Administered 2013-12-31: 10 mL via INTRAVENOUS
  Filled 2013-12-31: qty 10

## 2013-12-31 MED ORDER — HEPARIN SOD (PORK) LOCK FLUSH 100 UNIT/ML IV SOLN
500.0000 [IU] | Freq: Once | INTRAVENOUS | Status: AC
  Administered 2013-12-31: 500 [IU] via INTRAVENOUS
  Filled 2013-12-31: qty 5

## 2013-12-31 NOTE — Patient Instructions (Addendum)

## 2013-12-31 NOTE — Progress Notes (Signed)
Hematology and Oncology Follow Up Visit  Christopher Jimenez [id number] 08-03-1951 63 y.o. 12/31/2013 2:28 PM  Melinda Crutch, MDRoss, Antony Haste, MD   Principle Diagnosis: A 63 year old gentleman diagnosed with a T3 N1 rectosigmoid adenocarcinoma. He had 1/14 lymph nodes involved diagnosed in December 2010. 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  Prior Therapy: 1. Underwent a lower anterior resection done in January 2012. 2. Underwent adjuvant radiation therapy with Xeloda concluded in March 2011. 3. Received adjuvant FOLFOX therapy concluded in July 2012. 4. The patient had involvement of his adrenal gland and underwent adrenalectomy on April 2012 under the care of Dr. Excell Seltzer. 5. He is status post a microwave ablation of the hepatic metastasis done July 06, 2011.  Current therapy: On systemic chemotherapy with FOLFIRI/Avastin started on 07/21/12. He is here for his next cycle. Irinotecan was stopped in September 2014 due to fatigue. Avastin was stopped in November 2014 due to bleeding.  Interim History:  Christopher Jimenez presents today for a followup visit. 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 Since his last chemotherapy in visit, he has been doing relatively well. Note after his last cycle of chemotherapy a "raw feeling" in his mouth that lasted for about 7-10 days. He also noticed a watery bloody drainage in his ejaculate. He denied any cough, hemoptysis or hematemesis. He's not had any fevers, chills, headaches visual changes. Denied any neurologic symptoms. He is interested in taking a chemotherapy break.   Medications: I have reviewed the patient's current medications.  Current Outpatient Prescriptions  Medication Sig Dispense Refill  . sildenafil (VIAGRA) 25 MG tablet Take 1 tablet (25 mg total) by mouth  daily as needed for erectile dysfunction.  10 tablet  0  . Alum & Mag Hydroxide-Simeth (MAGIC MOUTHWASH W/LIDOCAINE) SOLN Take 5 mLs by mouth 3 (three) times daily as needed.  240 mL  1   Current Facility-Administered Medications  Medication Dose Route Frequency Provider Last Rate Last Dose  . sodium chloride 0.9 % injection 10 mL  10 mL Intravenous PRN Carlton Adam, PA-C   10 mL at 12/31/13 1056    Allergies: No Known Allergies  Past Medical History, Surgical history, Social history, and Family History were reviewed and updated.  Review of Systems:  Remaining ROS negative.  Physical Exam: Blood pressure 123/81, pulse 63, temperature 97.4 F (36.3 C), temperature source Oral, resp. rate 18, height 6\' 1"  (1.854 m), weight 192 lb 14.4 oz (87.499 kg), SpO2 98.00%.  ECOG: 1  General appearance: alert, cooperative and no distress Head: Normocephalic, without obvious abnormality, atraumatic Neck: no adenopathy, no carotid bruit, no JVD, supple, symmetrical, trachea midline and thyroid not enlarged, symmetric, no tenderness/mass/nodules Lymph nodes: Cervical, supraclavicular, and axillary nodes normal. Heart:regular rate and rhythm, S1, S2 normal, no murmur, click, rub or gallop Lung:chest clear, no wheezing, rales, normal symmetric air entry, no tachypnea, retractions or cyanosis Abdomen: soft, non-tender, without masses or organomegaly EXT:no erythema, induration, or nodules   Lab Results: Lab Results  Component Value Date   WBC 5.8 12/31/2013   HGB 13.2 12/31/2013   HCT 38.9 12/31/2013   MCV 88.2 12/31/2013   PLT 192 12/31/2013     Chemistry      Component Value Date/Time   NA 141 12/31/2013 0947   NA 142 02/19/2012 0910   NA 140 12/20/2011  1016   K 4.1 12/31/2013 0947   K 5.0* 02/19/2012 0910   K 4.5 12/20/2011 1016   CL 105 02/24/2013 1317   CL 99 02/19/2012 0910   CL 105 12/20/2011 1016   CO2 26 12/31/2013 0947   CO2 30 02/19/2012 0910   CO2 27 12/20/2011 1016   BUN 14.4 12/31/2013  0947   BUN 14 02/19/2012 0910   BUN 18 12/20/2011 1016   CREATININE 0.9 12/31/2013 0947   CREATININE 1.0 02/19/2012 0910   CREATININE 0.90 12/20/2011 1016      Component Value Date/Time   CALCIUM 9.4 12/31/2013 0947   CALCIUM 8.5 02/19/2012 0910   CALCIUM 9.0 12/20/2011 1016   ALKPHOS 91 12/31/2013 0947   ALKPHOS 53 02/19/2012 0910   ALKPHOS 59 12/20/2011 1016   AST 23 12/31/2013 0947   AST 21 02/19/2012 0910   AST 15 12/20/2011 1016   ALT 22 12/31/2013 0947   ALT 24 02/19/2012 0910   ALT 13 12/20/2011 1016   BILITOT 0.57 12/31/2013 0947   BILITOT 0.80 02/19/2012 0910   BILITOT 0.5 12/20/2011 1016      Impression and Plan: This is a pleasant 63 year old gentleman with the following issues:  1. Stage IV colorectal cancer. 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 He had a liver metastasis that has been ablated. His CT scan from 08/24/2013 showed stable disease without progression. For the time being, I will continue on the current dose of chemotherapy and we will schedule it every 3 to 4 weeks depending on his schedule. 2. Bloody penile discharge: He has had a mild recurrence of this phenomena. This had resolved after stopping the Avastin and we will continue with chemotherapy without.  3. Port-A-Cath management. PAC in place for chemotherapy.  4. Follow-up. We will skip today's scheduled chemotherapy. He'll discuss any further chemotherapy break with Dr. Alen Blew when he returns. In 4 weeks for the next chemotherapy cycle.      Carlton Adam 4/9/20152:28 PM

## 2013-12-31 NOTE — Patient Instructions (Signed)

## 2014-01-08 ENCOUNTER — Encounter (HOSPITAL_COMMUNITY): Payer: Self-pay

## 2014-01-28 ENCOUNTER — Ambulatory Visit (HOSPITAL_BASED_OUTPATIENT_CLINIC_OR_DEPARTMENT_OTHER): Payer: BC Managed Care – PPO

## 2014-01-28 ENCOUNTER — Ambulatory Visit (HOSPITAL_BASED_OUTPATIENT_CLINIC_OR_DEPARTMENT_OTHER): Payer: BC Managed Care – PPO | Admitting: Oncology

## 2014-01-28 VITALS — BP 123/80 | HR 66 | Temp 96.7°F | Resp 19 | Ht 73.0 in | Wt 195.2 lb

## 2014-01-28 DIAGNOSIS — C7970 Secondary malignant neoplasm of unspecified adrenal gland: Secondary | ICD-10-CM

## 2014-01-28 DIAGNOSIS — Z5111 Encounter for antineoplastic chemotherapy: Secondary | ICD-10-CM

## 2014-01-28 DIAGNOSIS — C787 Secondary malignant neoplasm of liver and intrahepatic bile duct: Secondary | ICD-10-CM

## 2014-01-28 DIAGNOSIS — C189 Malignant neoplasm of colon, unspecified: Secondary | ICD-10-CM

## 2014-01-28 DIAGNOSIS — C19 Malignant neoplasm of rectosigmoid junction: Secondary | ICD-10-CM

## 2014-01-28 LAB — COMPREHENSIVE METABOLIC PANEL (CC13)
ALT: 21 U/L (ref 0–55)
ANION GAP: 9 meq/L (ref 3–11)
AST: 21 U/L (ref 5–34)
Albumin: 3.9 g/dL (ref 3.5–5.0)
Alkaline Phosphatase: 90 U/L (ref 40–150)
BUN: 15.4 mg/dL (ref 7.0–26.0)
CALCIUM: 9.7 mg/dL (ref 8.4–10.4)
CHLORIDE: 108 meq/L (ref 98–109)
CO2: 24 meq/L (ref 22–29)
CREATININE: 0.9 mg/dL (ref 0.7–1.3)
Glucose: 112 mg/dl (ref 70–140)
Potassium: 4.1 mEq/L (ref 3.5–5.1)
Sodium: 141 mEq/L (ref 136–145)
Total Bilirubin: 0.67 mg/dL (ref 0.20–1.20)
Total Protein: 7 g/dL (ref 6.4–8.3)

## 2014-01-28 LAB — CBC WITH DIFFERENTIAL/PLATELET
BASO%: 0.7 % (ref 0.0–2.0)
Basophils Absolute: 0 10*3/uL (ref 0.0–0.1)
EOS%: 8.7 % — ABNORMAL HIGH (ref 0.0–7.0)
Eosinophils Absolute: 0.6 10*3/uL — ABNORMAL HIGH (ref 0.0–0.5)
HEMATOCRIT: 40 % (ref 38.4–49.9)
HGB: 13.5 g/dL (ref 13.0–17.1)
LYMPH#: 1.2 10*3/uL (ref 0.9–3.3)
LYMPH%: 18.3 % (ref 14.0–49.0)
MCH: 29.6 pg (ref 27.2–33.4)
MCHC: 33.7 g/dL (ref 32.0–36.0)
MCV: 87.8 fL (ref 79.3–98.0)
MONO#: 0.7 10*3/uL (ref 0.1–0.9)
MONO%: 10.4 % (ref 0.0–14.0)
NEUT#: 3.9 10*3/uL (ref 1.5–6.5)
NEUT%: 61.9 % (ref 39.0–75.0)
Platelets: 203 10*3/uL (ref 140–400)
RBC: 4.56 10*6/uL (ref 4.20–5.82)
RDW: 13.8 % (ref 11.0–14.6)
WBC: 6.4 10*3/uL (ref 4.0–10.3)

## 2014-01-28 MED ORDER — DEXAMETHASONE SODIUM PHOSPHATE 20 MG/5ML IJ SOLN
20.0000 mg | Freq: Once | INTRAMUSCULAR | Status: AC
  Administered 2014-01-28: 20 mg via INTRAVENOUS

## 2014-01-28 MED ORDER — ONDANSETRON 16 MG/50ML IVPB (CHCC)
16.0000 mg | Freq: Once | INTRAVENOUS | Status: AC
  Administered 2014-01-28: 16 mg via INTRAVENOUS

## 2014-01-28 MED ORDER — LEUCOVORIN CALCIUM INJECTION 350 MG
396.0000 mg/m2 | Freq: Once | INTRAVENOUS | Status: AC
  Administered 2014-01-28: 800 mg via INTRAVENOUS
  Filled 2014-01-28: qty 40

## 2014-01-28 MED ORDER — ONDANSETRON 16 MG/50ML IVPB (CHCC)
INTRAVENOUS | Status: AC
  Filled 2014-01-28: qty 16

## 2014-01-28 MED ORDER — DEXAMETHASONE SODIUM PHOSPHATE 20 MG/5ML IJ SOLN
INTRAMUSCULAR | Status: AC
  Filled 2014-01-28: qty 5

## 2014-01-28 MED ORDER — HEPARIN SOD (PORK) LOCK FLUSH 100 UNIT/ML IV SOLN
500.0000 [IU] | Freq: Once | INTRAVENOUS | Status: AC
  Administered 2014-01-28: 500 [IU] via INTRAVENOUS
  Filled 2014-01-28: qty 5

## 2014-01-28 MED ORDER — SODIUM CHLORIDE 0.9 % IV SOLN
Freq: Once | INTRAVENOUS | Status: AC
  Administered 2014-01-28: 12:00:00 via INTRAVENOUS

## 2014-01-28 MED ORDER — SODIUM CHLORIDE 0.9 % IJ SOLN
10.0000 mL | INTRAMUSCULAR | Status: DC | PRN
  Administered 2014-01-28: 10 mL via INTRAVENOUS
  Filled 2014-01-28: qty 10

## 2014-01-28 MED ORDER — ATROPINE SULFATE 1 MG/ML IJ SOLN: 0.5000 mg | Freq: Once | INTRAMUSCULAR | Status: DC | PRN

## 2014-01-28 MED ORDER — SODIUM CHLORIDE 0.9 % IV SOLN
2400.0000 mg/m2 | INTRAVENOUS | Status: DC
  Administered 2014-01-28: 4850 mg via INTRAVENOUS
  Filled 2014-01-28: qty 97

## 2014-01-28 MED ORDER — FLUOROURACIL CHEMO INJECTION 2.5 GM/50ML
400.0000 mg/m2 | Freq: Once | INTRAVENOUS | Status: AC
  Administered 2014-01-28: 800 mg via INTRAVENOUS
  Filled 2014-01-28: qty 16

## 2014-01-28 NOTE — Progress Notes (Signed)
Hematology and Oncology Follow Up Visit  Christopher Jimenez [id number] October 23, 1950 63 y.o. 01/28/2014 10:54 AM  Melinda Crutch, MDRoss, Antony Haste, MD   Principle Diagnosis: A 63 year old gentleman diagnosed with a T3 N1 rectosigmoid adenocarcinoma. He had 1/14 lymph nodes involved diagnosed in December 2010. 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  Prior Therapy: 1. Underwent a lower anterior resection done in January 2012. 2. Underwent adjuvant radiation therapy with Xeloda concluded in March 2011. 3. Received adjuvant FOLFOX therapy concluded in July 2012. 4. The patient had involvement of his adrenal gland and underwent adrenalectomy on April 2012 under the care of Dr. Excell Seltzer. 5. He is status post a microwave ablation of the hepatic metastasis done July 06, 2011.  Current therapy: On systemic chemotherapy with FOLFIRI/Avastin started on 07/21/12. He is here for his next cycle. Irinotecan was stopped in September 2014 due to fatigue. Avastin was stopped in November 2014 due to bleeding.  Interim History:  Mr. Cundari presents today for a followup visit. 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 Since his last chemotherapy in visit, he has been doing relatively well. He denied any cough, hemoptysis or hematemesis. He's not had any fevers, chills, headaches visual changes. Denied any neurologic symptoms. He has not reported any complications from chemotherapy. Has not reported any infusion-related complications. His energy level remains excellent. He still commutes from the [facility] and he is interested in decreasing history at this over here.    Medications: I have reviewed the patient's current medications.  Current Outpatient Prescriptions  Medication Sig Dispense Refill  . Alum & Mag Hydroxide-Simeth (MAGIC  MOUTHWASH W/LIDOCAINE) SOLN Take 5 mLs by mouth 3 (three) times daily as needed.  240 mL  1  . sildenafil (VIAGRA) 25 MG tablet Take 1 tablet (25 mg total) by mouth daily as needed for erectile dysfunction.  10 tablet  0   No current facility-administered medications for this visit.   Facility-Administered Medications Ordered in Other Visits  Medication Dose Route Frequency Provider Last Rate Last Dose  . sodium chloride 0.9 % injection 10 mL  10 mL Intravenous PRN Wyatt Portela, MD   10 mL at 01/28/14 1012    Allergies: No Known Allergies  Past Medical History, Surgical history, Social history, and Family History were reviewed and updated.  Review of Systems:  Remaining ROS negative.  Physical Exam: Blood pressure 123/80, pulse 66, temperature 96.7 F (35.9 C), temperature source Oral, resp. rate 19, height 6\' 1"  (1.854 m), weight 195 lb 3.2 oz (88.542 kg).  ECOG: 1  General appearance: alert, cooperative and no distress Head: Normocephalic, without obvious abnormality, atraumatic Neck: no adenopathy, no carotid bruit, no JVD, supple, symmetrical, trachea midline and thyroid not enlarged, symmetric, no tenderness/mass/nodules Lymph nodes: Cervical, supraclavicular, and axillary nodes normal. Heart:regular rate and rhythm, S1, S2 normal, no murmur, click, rub or gallop Lung:chest clear, no wheezing, rales, normal symmetric air entry, no tachypnea, retractions or cyanosis Abdomen: soft, non-tender, without masses or organomegaly EXT:no erythema, induration, or nodules   Lab Results: Lab Results  Component Value Date   WBC 6.4 01/28/2014   HGB 13.5 01/28/2014   HCT 40.0 01/28/2014   MCV 87.8 01/28/2014   PLT 203 01/28/2014     Chemistry      Component Value Date/Time   NA 141 01/28/2014 0947  NA 142 02/19/2012 0910   NA 140 12/20/2011 1016   K 4.1 01/28/2014 0947   K 5.0* 02/19/2012 0910   K 4.5 12/20/2011 1016   CL 105 02/24/2013 1317   CL 99 02/19/2012 0910   CL 105 12/20/2011 1016    CO2 24 01/28/2014 0947   CO2 30 02/19/2012 0910   CO2 27 12/20/2011 1016   BUN 15.4 01/28/2014 0947   BUN 14 02/19/2012 0910   BUN 18 12/20/2011 1016   CREATININE 0.9 01/28/2014 0947   CREATININE 1.0 02/19/2012 0910   CREATININE 0.90 12/20/2011 1016      Component Value Date/Time   CALCIUM 9.7 01/28/2014 0947   CALCIUM 8.5 02/19/2012 0910   CALCIUM 9.0 12/20/2011 1016   ALKPHOS 90 01/28/2014 0947   ALKPHOS 53 02/19/2012 0910   ALKPHOS 59 12/20/2011 1016   AST 21 01/28/2014 0947   AST 21 02/19/2012 0910   AST 15 12/20/2011 1016   ALT 21 01/28/2014 0947   ALT 24 02/19/2012 0910   ALT 13 12/20/2011 1016   BILITOT 0.67 01/28/2014 0947   BILITOT 0.80 02/19/2012 0910   BILITOT 0.5 12/20/2011 1016      Impression and Plan: This is a pleasant 63 year old gentleman with the following issues:  1. Stage IV colorectal cancer. 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 He had a liver metastasis that has been ablated. His CT scan from 08/24/2013 showed stable disease without progression. The plan is to proceed with systemic chemotherapy today and I will repeat imaging studies stores and of June based on his request. And I will reevaluate him at that time and potentially resume chemotherapy after a brief 6-8 week break. 2. Bloody penile discharge: This is improved at this time.. 3. Port-A-Cath management. PAC in place for chemotherapy.  4. Follow-up. Will be in June of 2015 after a CT scan. The CT scan will be done in the [place] area based on his request.     Wyatt Portela 5/7/201510:54 AM

## 2014-01-28 NOTE — Patient Instructions (Signed)
[facility] Discharge Instructions for Patients Receiving Chemotherapy  Today you received the following chemotherapy agents Leucovorin and Fluorouracil.   To help prevent nausea and vomiting after your treatment, we encourage you to take your nausea medication as directed.    If you develop nausea and vomiting that is not controlled by your nausea medication, call the clinic.   BELOW ARE SYMPTOMS THAT SHOULD BE REPORTED IMMEDIATELY:  *FEVER GREATER THAN 100.5 F  *CHILLS WITH OR WITHOUT FEVER  NAUSEA AND VOMITING THAT IS NOT CONTROLLED WITH YOUR NAUSEA MEDICATION  *UNUSUAL SHORTNESS OF BREATH  *UNUSUAL BRUISING OR BLEEDING  TENDERNESS IN MOUTH AND THROAT WITH OR WITHOUT PRESENCE OF ULCERS  *URINARY PROBLEMS  *BOWEL PROBLEMS  UNUSUAL RASH Items with * indicate a potential emergency and should be followed up as soon as possible.  Feel free to call the clinic you have any questions or concerns. The clinic phone number is [phone].    

## 2014-01-29 LAB — CEA: CEA: 404.8 ng/mL — ABNORMAL HIGH (ref 0.0–5.0)

## 2014-01-30 ENCOUNTER — Ambulatory Visit (HOSPITAL_BASED_OUTPATIENT_CLINIC_OR_DEPARTMENT_OTHER): Payer: BC Managed Care – PPO

## 2014-01-30 ENCOUNTER — Telehealth: Payer: Self-pay | Admitting: Oncology

## 2014-01-30 VITALS — BP 105/61 | HR 70 | Temp 98.1°F | Resp 16

## 2014-01-30 DIAGNOSIS — C7970 Secondary malignant neoplasm of unspecified adrenal gland: Secondary | ICD-10-CM

## 2014-01-30 DIAGNOSIS — Z452 Encounter for adjustment and management of vascular access device: Secondary | ICD-10-CM

## 2014-01-30 DIAGNOSIS — C787 Secondary malignant neoplasm of liver and intrahepatic bile duct: Secondary | ICD-10-CM

## 2014-01-30 DIAGNOSIS — C19 Malignant neoplasm of rectosigmoid junction: Secondary | ICD-10-CM

## 2014-01-30 MED ORDER — SODIUM CHLORIDE 0.9 % IJ SOLN
10.0000 mL | INTRAMUSCULAR | Status: DC | PRN
  Administered 2014-01-30: 10 mL
  Filled 2014-01-30: qty 10

## 2014-01-30 MED ORDER — HEPARIN SOD (PORK) LOCK FLUSH 100 UNIT/ML IV SOLN
500.0000 [IU] | Freq: Once | INTRAVENOUS | Status: AC | PRN
  Administered 2014-01-30: 500 [IU]
  Filled 2014-01-30: qty 5

## 2014-01-30 NOTE — Telephone Encounter (Signed)
, °

## 2014-02-04 ENCOUNTER — Telehealth: Payer: Self-pay | Admitting: *Deleted

## 2014-02-04 NOTE — Telephone Encounter (Signed)
Per staff message and POF I have scheduled appts.  JMW  

## 2014-02-18 ENCOUNTER — Telehealth: Payer: Self-pay | Admitting: Medical Oncology

## 2014-02-18 NOTE — Telephone Encounter (Signed)
Patient LVMOM stating he needed insurance clearance for CT scan sched 06/19 @ [facility] in [place]. As well as a copy of recent labs faxed to them. Mssg forwarded to Benedetto Goad, managed care.

## 2014-02-18 NOTE — Telephone Encounter (Signed)
Return call to pt regarding phone mssg. Pt with no CT orders, informed pt MD out of office and will review when he returns. Patient requesting for CT to be done at [facility] radiology department and labs to be faxed to [phone]. Pt states has appt June 19th for scan. Will still need insurance clearance once orders are placed.  Mssg forwarded to MD for review and CT orders. Labs faxed to dx imaging dept per patient's request @ [phone] Mssg forwarded to Benedetto Goad in managed care.

## 2014-02-19 ENCOUNTER — Encounter: Payer: Self-pay | Admitting: Oncology

## 2014-02-19 NOTE — Progress Notes (Unsigned)
02/19/2014  I spoke with Christopher Jimenez this morning concerning his upcoming ct scan.  The scan has been authorized by [employer] [place] .  I am mailing the patient a copy of this auth# and faxing the information to [facility] department.  Patient voiced understanding.  Vaughan Basta [phone]

## 2014-02-24 ENCOUNTER — Telehealth: Payer: Self-pay | Admitting: Medical Oncology

## 2014-02-24 NOTE — Telephone Encounter (Signed)
Darlene with [facility] dept called requesting orders for patient's Ct of abdomen and pelvis w/constrast sched 06/19. States patient requesting a chest CT w/constrast as well. Will need orders for creat to be drawn and all orders to be faxed to [phone].  - Orders for CT abd/pelvis w/constrast - Orders for CT of chest w/conctrast  (will also need insurance authorization) - Orders for creatine labs to be drawn  Request to have all faxed to above number.  MD inboxed for orders.  LOV 05/07 F/u 06/25 lab/MD/tx

## 2014-02-24 NOTE — Telephone Encounter (Signed)
F/U call to [place] to inform her requested orders faxed to her at number given. As well as to inform her that CT of chest preauth given to managed care for insurance preauthorization.

## 2014-02-25 IMAGING — PT NM PET TUM IMG RESTAG (PS) SKULL BASE T - THIGH
6 series · 25 of 25 positions shown · non-contrast
Comparison: 05/29/2011

CLINICAL DATA: Subsequent treatment strategy for colon cancer.

NUCLEAR MEDICINE PET SKULL BASE TO THIGH
Fasting Blood Glucose:  100
TECHNIQUE: 16.6 mCi F-18 FDG was injected intravenously. CT data
was obtained and used for attenuation correction and anatomic
localization only.  (This was not acquired as a diagnostic CT
examination.) Additional exam technical data entered on
technologist worksheet.

[Series 1: pet ac · axial · 3.3mm · 4.69mm/px · z∈[-870,+0]mm · 5 of 267 slices shown]
[im 1/267]
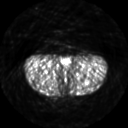
[im 67/267]
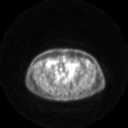
[im 134/267]
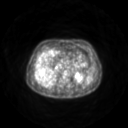
[im 200/267]
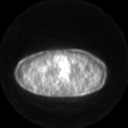
[im 267/267]
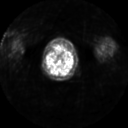

[Series 2: ct images · axial · 3.8mm · 0.98mm/px · z∈[-870,+0]mm · 5 of 266 slices shown]
[im 1/266]
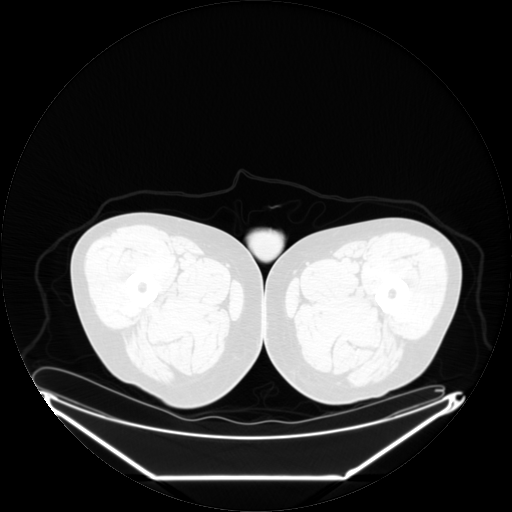
[im 67/266]
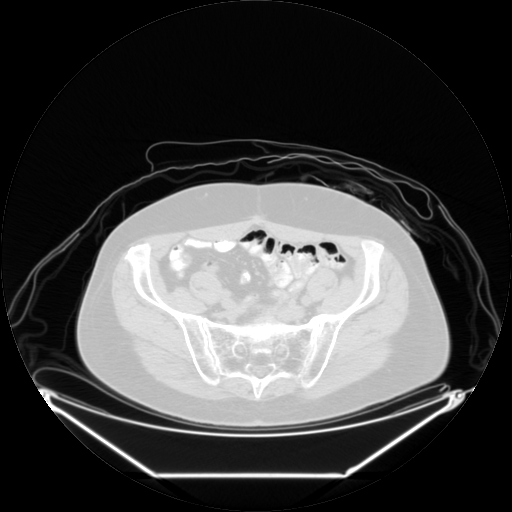
[im 133/266]
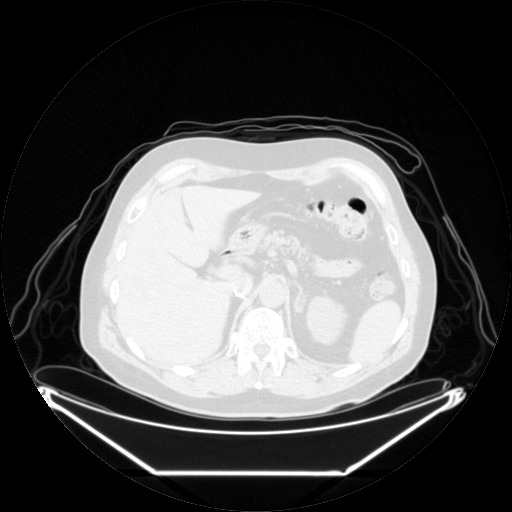
[im 199/266]
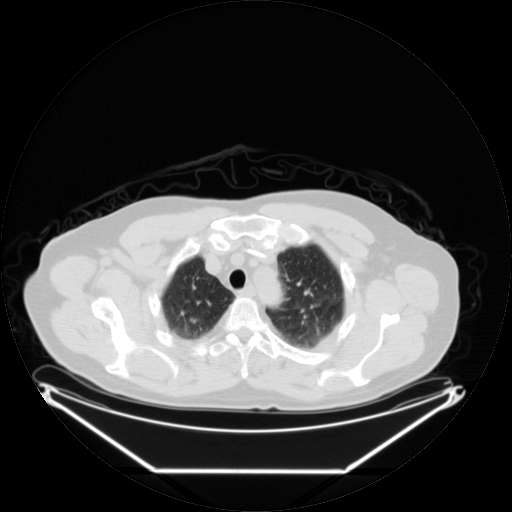
[im 266/266  brain]
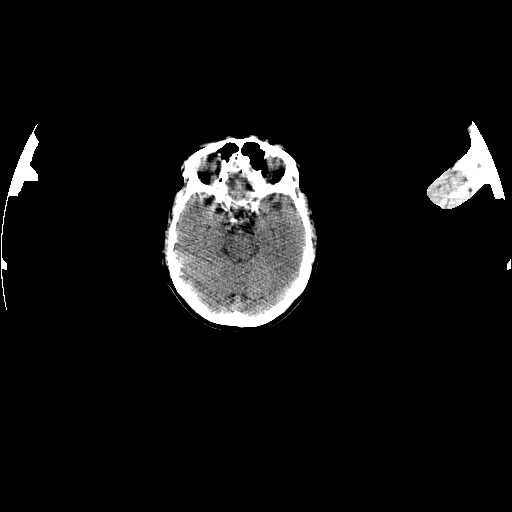

[Series 2: pet nac · axial · 3.3mm · 4.69mm/px · z∈[-870,+0]mm · 6 of 267 slices shown]
[im 1/267]
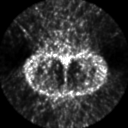
[im 54/267]
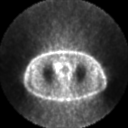
[im 107/267]
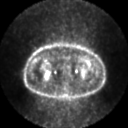
[im 160/267]
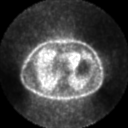
[im 213/267]
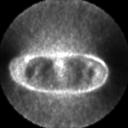
[im 267/267]
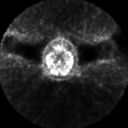

[Series 123: mip · coronal · 3.3mm · 4.69mm/px · 1 of 30 slices shown]
[im 1/30]
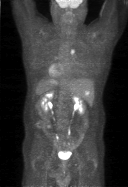

[Series 151: reformatted · axial · 3.3mm · 3.91mm/px · z∈[-870,+0]mm · 6 of 267 slices shown (1 of 2)]
[im 1/267]
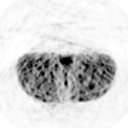
[im 54/267]
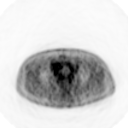
[im 107/267]
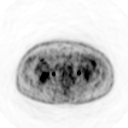
[im 160/267]
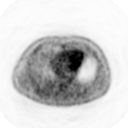
[im 213/267]
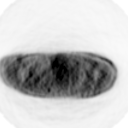
[im 267/267]
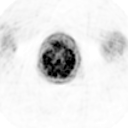

[Series 153: reformatted · coronal · 4.7mm · 6.98mm/px · 2 of 77 slices shown (2 of 2)]
[im 1/77]
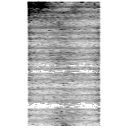
[im 77/77]
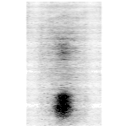

[25 of 25 positions shown; findings below may reference images not displayed]

FINDINGS: Neck: No hypermetabolic lymph nodes in the neck.

Chest:  Hypermetabolic right paratracheal lymph node is identified.
This measures 1.8 cm and has an SUV max equal to 10.5 this is a new
finding compared with previous exam.  No pericardial or pleural
effusion.  Tiny nodule within the left upper lobe is too small to
characterize measuring 6.9 mm, image 93.  Previously this nodule
measured 3.9 mm.

Abdomen/Pelvis:  Hypermetabolic lesion within the right hepatic
lobe measure 2.7 cm, image 136.  The SUV max associated this lesion
is equal to 10.4.  Previously this lesion measured 1.6 cm and had
an SUV by equal to 5.8.No abnormal hypermetabolic activity within
the pancreas, adrenal glands, or spleen.  No hypermetabolic lymph
nodes in the abdomen or pelvis.

[place]:  No focal hypermetabolic activity to suggest skeletal
metastasis.
IMPRESSION: 1.  Interval progression of disease.
2.  New hypermetabolic enlarged right paratracheal lymph node.
3.  Increase in size and degree of FDG uptake associated with the
right hepatic lobe lesion.

## 2014-03-11 ENCOUNTER — Telehealth: Payer: Self-pay | Admitting: Medical Oncology

## 2014-03-11 NOTE — Telephone Encounter (Signed)
Pt LVMOM stating needed prescription for PAC to be flushed @ [place] location, number provided [phone]  Call to [facility] to clarify request, state need written prescription stating "PAC flush" to be faxed to their office attn Darlene @ [phone]  Patient informed prescription faxed per his request. Expressed thanks and confirmed next weeks appt.

## 2014-03-18 ENCOUNTER — Ambulatory Visit (HOSPITAL_BASED_OUTPATIENT_CLINIC_OR_DEPARTMENT_OTHER): Payer: BC Managed Care – PPO

## 2014-03-18 ENCOUNTER — Other Ambulatory Visit (HOSPITAL_BASED_OUTPATIENT_CLINIC_OR_DEPARTMENT_OTHER): Payer: BC Managed Care – PPO

## 2014-03-18 ENCOUNTER — Encounter: Payer: Self-pay | Admitting: Oncology

## 2014-03-18 ENCOUNTER — Ambulatory Visit (HOSPITAL_BASED_OUTPATIENT_CLINIC_OR_DEPARTMENT_OTHER): Payer: BC Managed Care – PPO | Admitting: Oncology

## 2014-03-18 VITALS — BP 149/81 | HR 91 | Temp 97.9°F

## 2014-03-18 VITALS — BP 109/76 | HR 75 | Temp 97.5°F | Resp 20 | Ht 73.0 in | Wt 188.3 lb

## 2014-03-18 DIAGNOSIS — N529 Male erectile dysfunction, unspecified: Secondary | ICD-10-CM

## 2014-03-18 DIAGNOSIS — Z5111 Encounter for antineoplastic chemotherapy: Secondary | ICD-10-CM

## 2014-03-18 DIAGNOSIS — C19 Malignant neoplasm of rectosigmoid junction: Secondary | ICD-10-CM

## 2014-03-18 DIAGNOSIS — C7970 Secondary malignant neoplasm of unspecified adrenal gland: Secondary | ICD-10-CM

## 2014-03-18 DIAGNOSIS — C787 Secondary malignant neoplasm of liver and intrahepatic bile duct: Secondary | ICD-10-CM

## 2014-03-18 DIAGNOSIS — C189 Malignant neoplasm of colon, unspecified: Secondary | ICD-10-CM

## 2014-03-18 LAB — CBC WITH DIFFERENTIAL/PLATELET
BASO%: 0.1 % (ref 0.0–2.0)
Basophils Absolute: 0 10*3/uL (ref 0.0–0.1)
EOS%: 6.7 % (ref 0.0–7.0)
Eosinophils Absolute: 0.5 10*3/uL (ref 0.0–0.5)
HCT: 38.2 % — ABNORMAL LOW (ref 38.4–49.9)
HGB: 12.9 g/dL — ABNORMAL LOW (ref 13.0–17.1)
LYMPH#: 0.9 10*3/uL (ref 0.9–3.3)
LYMPH%: 14 % (ref 14.0–49.0)
MCH: 29.2 pg (ref 27.2–33.4)
MCHC: 33.8 g/dL (ref 32.0–36.0)
MCV: 86.4 fL (ref 79.3–98.0)
MONO#: 0.9 10*3/uL (ref 0.1–0.9)
MONO%: 13.6 % (ref 0.0–14.0)
NEUT#: 4.4 10*3/uL (ref 1.5–6.5)
NEUT%: 65.6 % (ref 39.0–75.0)
Platelets: 208 10*3/uL (ref 140–400)
RBC: 4.42 10*6/uL (ref 4.20–5.82)
RDW: 13.2 % (ref 11.0–14.6)
WBC: 6.7 10*3/uL (ref 4.0–10.3)

## 2014-03-18 LAB — COMPREHENSIVE METABOLIC PANEL (CC13)
ALT: 16 U/L (ref 0–55)
AST: 15 U/L (ref 5–34)
Albumin: 3.8 g/dL (ref 3.5–5.0)
Alkaline Phosphatase: 97 U/L (ref 40–150)
Anion Gap: 10 mEq/L (ref 3–11)
BUN: 13.1 mg/dL (ref 7.0–26.0)
CHLORIDE: 105 meq/L (ref 98–109)
CO2: 24 meq/L (ref 22–29)
Calcium: 9.5 mg/dL (ref 8.4–10.4)
Creatinine: 0.9 mg/dL (ref 0.7–1.3)
Glucose: 105 mg/dl (ref 70–140)
POTASSIUM: 4.1 meq/L (ref 3.5–5.1)
SODIUM: 139 meq/L (ref 136–145)
Total Bilirubin: 0.93 mg/dL (ref 0.20–1.20)
Total Protein: 7.1 g/dL (ref 6.4–8.3)

## 2014-03-18 LAB — CEA: CEA: 683.4 ng/mL — ABNORMAL HIGH (ref 0.0–5.0)

## 2014-03-18 MED ORDER — ONDANSETRON 16 MG/50ML IVPB (CHCC)
16.0000 mg | Freq: Once | INTRAVENOUS | Status: AC
  Administered 2014-03-18: 16 mg via INTRAVENOUS

## 2014-03-18 MED ORDER — DEXAMETHASONE SODIUM PHOSPHATE 20 MG/5ML IJ SOLN
INTRAMUSCULAR | Status: AC
  Filled 2014-03-18: qty 5

## 2014-03-18 MED ORDER — SILDENAFIL CITRATE 50 MG PO TABS: 50.0000 mg | ORAL_TABLET | Freq: Every day | ORAL | Status: AC | PRN

## 2014-03-18 MED ORDER — SODIUM CHLORIDE 0.9 % IV SOLN
6.0000 mg/kg | Freq: Once | INTRAVENOUS | Status: AC
  Administered 2014-03-18: 520 mg via INTRAVENOUS
  Filled 2014-03-18: qty 26

## 2014-03-18 MED ORDER — FLUOROURACIL CHEMO INJECTION 2.5 GM/50ML
400.0000 mg/m2 | Freq: Once | INTRAVENOUS | Status: AC
  Administered 2014-03-18: 800 mg via INTRAVENOUS
  Filled 2014-03-18: qty 16

## 2014-03-18 MED ORDER — DEXAMETHASONE SODIUM PHOSPHATE 20 MG/5ML IJ SOLN
20.0000 mg | Freq: Once | INTRAMUSCULAR | Status: AC
  Administered 2014-03-18: 20 mg via INTRAVENOUS

## 2014-03-18 MED ORDER — ONDANSETRON 16 MG/50ML IVPB (CHCC)
INTRAVENOUS | Status: AC
  Filled 2014-03-18: qty 16

## 2014-03-18 MED ORDER — SODIUM CHLORIDE 0.9 % IV SOLN
Freq: Once | INTRAVENOUS | Status: AC
  Administered 2014-03-18: 11:00:00 via INTRAVENOUS

## 2014-03-18 MED ORDER — SODIUM CHLORIDE 0.9 % IV SOLN
2400.0000 mg/m2 | INTRAVENOUS | Status: DC
  Administered 2014-03-18: 4850 mg via INTRAVENOUS
  Filled 2014-03-18: qty 97

## 2014-03-18 MED ORDER — LEUCOVORIN CALCIUM INJECTION 350 MG
396.0000 mg/m2 | Freq: Once | INTRAVENOUS | Status: AC
  Administered 2014-03-18: 800 mg via INTRAVENOUS
  Filled 2014-03-18: qty 40

## 2014-03-18 NOTE — Patient Instructions (Addendum)
[facility] Discharge Instructions for Patients Receiving Chemotherapy  Today you received the following chemotherapy agents vectibix, 5FU, leucovorin and 5 FU infusion over 46 hours. To help prevent nausea and vomiting after your treatment, we encourage you to take your nausea medication compazine 10 mg every 6 hours as needed.   If you develop nausea and vomiting that is not controlled by your nausea medication, call the clinic.   BELOW ARE SYMPTOMS THAT SHOULD BE REPORTED IMMEDIATELY:  *FEVER GREATER THAN 100.5 F  *CHILLS WITH OR WITHOUT FEVER  NAUSEA AND VOMITING THAT IS NOT CONTROLLED WITH YOUR NAUSEA MEDICATION  *UNUSUAL SHORTNESS OF BREATH  *UNUSUAL BRUISING OR BLEEDING  TENDERNESS IN MOUTH AND THROAT WITH OR WITHOUT PRESENCE OF ULCERS  *URINARY PROBLEMS  *BOWEL PROBLEMS  UNUSUAL RASH Items with * indicate a potential emergency and should be followed up as soon as possible.  Feel free to call the clinic you have any questions or concerns. The clinic phone number is [phone].

## 2014-03-18 NOTE — Progress Notes (Signed)
Hematology and Oncology Follow Up Visit  Christopher Jimenez [id number] 30-Sep-1950 63 y.o. 03/18/2014 9:54 AM  Melinda Crutch, MDRoss, Antony Haste, MD   Principle Diagnosis: A 63 year old gentleman diagnosed with a T3 N1 rectosigmoid adenocarcinoma. He had 1/14 lymph nodes involved diagnosed in December 2010. 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  Prior Therapy: 1. Underwent a lower anterior resection done in January 2012. 2. Underwent adjuvant radiation therapy with Xeloda concluded in March 2011. 3. Received adjuvant FOLFOX therapy concluded in July 2012. 4. The patient had involvement of his adrenal gland and underwent adrenalectomy on April 2012 under the care of Dr. Excell Seltzer. 5. He is status post a microwave ablation of the hepatic metastasis done July 06, 2011.  Current therapy: On systemic chemotherapy with FOLFIRI/Avastin started on 07/21/12. He is here for his next cycle. Irinotecan was stopped in September 2014 due to fatigue. Avastin was stopped in November 2014 due to bleeding.  Interim History:  Christopher Jimenez presents today for a followup visit by himself. Since his last chemotherapy in visit, he has been doing relatively well. He is reporting slightly more fatigue and occasional exertional dyspnea. He does report some occasional wheezing as well. He denied any cough, hemoptysis or hematemesis. He's not had any fevers, chills, headaches visual changes. Denied any neurologic symptoms. He has not reported any complications from chemotherapy. Has not reported any infusion-related complications. His energy level remains excellent. He still commutes from the [facility]. He does not report any headaches or blurry vision or double vision. Does not report any chest pain shortness of breath cough or hemoptysis. Does report any nausea or vomiting or abdominal pain. Does not report any frequency urgency or hesitancy. He does report some erectile dysfunction but no dysuria or drainage. She does not  reported skin rashes or lymphadenopathy. His mood continue to be excellent and his quality of life remain excellent.   Medications: I have reviewed the patient's current medications.  Current Outpatient Prescriptions  Medication Sig Dispense Refill  . Alum & Mag Hydroxide-Simeth (MAGIC MOUTHWASH W/LIDOCAINE) SOLN Take 5 mLs by mouth 3 (three) times daily as needed.  240 mL  1  . sildenafil (VIAGRA) 50 MG tablet Take 1 tablet (50 mg total) by mouth daily as needed for erectile dysfunction.  10 tablet  1   No current facility-administered medications for this visit.    Allergies: No Known Allergies  Past Medical History, Surgical history, Social history, and Family History were reviewed and updated.    Physical Exam: Blood pressure 109/76, pulse 75, temperature 97.5 F (36.4 C), temperature source Oral, resp. rate 20, height $RemoveBe'6\' 1"'uPHXVdwJV$  (1.854 m), weight 188 lb 4.8 oz (85.412 kg), SpO2 100.00%.  ECOG: 1  General appearance: alert and cooperative Head: Normocephalic, without obvious abnormality, atraumatic Neck: no adenopathy Lymph nodes: Cervical, supraclavicular, and axillary nodes normal. Heart:regular rate and rhythm, S1, S2 normal, no murmur, click, rub or gallop Lung:chest clear, no wheezing, rales, normal symmetric air entry, no tachypnea, retractions or cyanosis Abdomen: soft, non-tender, without masses or organomegaly EXT:no erythema, induration, or nodules   Lab Results: Lab Results  Component Value Date   WBC 6.7 03/18/2014   HGB 12.9* 03/18/2014   HCT 38.2* 03/18/2014   MCV 86.4 03/18/2014   PLT 208 03/18/2014     Chemistry      Component Value Date/Time   NA 139 03/18/2014 0908   NA 142 02/19/2012 0910   NA 140 12/20/2011 1016   K 4.1 03/18/2014 0908  K 5.0* 02/19/2012 0910   K 4.5 12/20/2011 1016   CL 105 02/24/2013 1317   CL 99 02/19/2012 0910   CL 105 12/20/2011 1016   CO2 24 03/18/2014 0908   CO2 30 02/19/2012 0910   CO2 27 12/20/2011 1016   BUN 13.1 03/18/2014 0908    BUN 14 02/19/2012 0910   BUN 18 12/20/2011 1016   CREATININE 0.9 03/18/2014 0908   CREATININE 1.0 02/19/2012 0910   CREATININE 0.90 12/20/2011 1016      Component Value Date/Time   CALCIUM 9.5 03/18/2014 0908   CALCIUM 8.5 02/19/2012 0910   CALCIUM 9.0 12/20/2011 1016   ALKPHOS 97 03/18/2014 0908   ALKPHOS 53 02/19/2012 0910   ALKPHOS 59 12/20/2011 1016   AST 15 03/18/2014 0908   AST 21 02/19/2012 0910   AST 15 12/20/2011 1016   ALT 16 03/18/2014 0908   ALT 24 02/19/2012 0910   ALT 13 12/20/2011 1016   BILITOT 0.93 03/18/2014 0908   BILITOT 0.80 02/19/2012 0910   BILITOT 0.5 12/20/2011 1016       Impression and Plan: This is a pleasant 63 year old gentleman with the following issues:  1. Stage IV colorectal cancer. 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 He had a liver metastasis that has been ablated. His CT scan from 6/19 /2015 which was obtained at outside facility based on his request was reviewed. His CT scan showed progression of disease predominantly in his pulmonary nodules. He has a 3.1 cm right hilar mass concerning for malignancy. His hepatic lesions an adrenal lesions are relatively stable. I Had a long discussion today with Christopher Jimenez regarding treatment options. 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 I favor switching his therapy to single agent CPT-11 with Vectibix starting the next cycle. Risks and benefits of this approach were discussed. Complication that include skin rash an acneform rash was discussed extensively. I've given him written information about this drug I anticipate starting him with the next visit. [place] I've advised him in the future to consider either moving back to this area or finding a local oncology group at the [place]. For the time being we'll continue to treat him based on his request. 2. Erectile dysfunction: Prescription for  Viagra was given today.  3. Port-A-Cath management. PAC in place for chemotherapy.  4. Follow-up. Will be [place] July of 2015 base at his request to resume chemotherapy as discussed.      Thia Olesen 6/25/20159:54 AM

## 2014-03-19 ENCOUNTER — Telehealth: Payer: Self-pay | Admitting: *Deleted

## 2014-03-19 ENCOUNTER — Telehealth: Payer: Self-pay | Admitting: Oncology

## 2014-03-19 NOTE — Telephone Encounter (Signed)
Duplicate, no notes

## 2014-03-19 NOTE — Telephone Encounter (Signed)
Called and spoke to patient. States he is doing great and having no problems. States he is "very optimistic" on treatment. Instructed patient to call us should he have any questions or concerns.

## 2014-03-19 NOTE — Telephone Encounter (Signed)
Message copied by Verlon Setting on Fri Mar 19, 2014 12:09 PM ------      Message from: Judyann Munson R      Created: Thu Mar 18, 2014  1:24 PM      Regarding: chemo follow up      Contact: [phone]       First time Vectibix 03/18/14- Tolerated well. Dr. Alen Blew pt ------

## 2014-03-19 NOTE — Telephone Encounter (Signed)
Unable to reach the pt by telephone will mail his appt calendar.

## 2014-03-20 ENCOUNTER — Other Ambulatory Visit: Payer: Self-pay | Admitting: Emergency Medicine

## 2014-03-20 ENCOUNTER — Ambulatory Visit (HOSPITAL_BASED_OUTPATIENT_CLINIC_OR_DEPARTMENT_OTHER): Payer: BC Managed Care – PPO

## 2014-03-20 VITALS — BP 125/72 | HR 68 | Temp 98.3°F | Resp 18

## 2014-03-20 DIAGNOSIS — C7970 Secondary malignant neoplasm of unspecified adrenal gland: Secondary | ICD-10-CM

## 2014-03-20 DIAGNOSIS — C19 Malignant neoplasm of rectosigmoid junction: Secondary | ICD-10-CM

## 2014-03-20 DIAGNOSIS — Z452 Encounter for adjustment and management of vascular access device: Secondary | ICD-10-CM

## 2014-03-20 DIAGNOSIS — C787 Secondary malignant neoplasm of liver and intrahepatic bile duct: Secondary | ICD-10-CM

## 2014-03-20 MED ORDER — SODIUM CHLORIDE 0.9 % IJ SOLN
10.0000 mL | INTRAMUSCULAR | Status: DC | PRN
  Administered 2014-03-20: 10 mL
  Filled 2014-03-20: qty 10

## 2014-03-20 MED ORDER — HEPARIN SOD (PORK) LOCK FLUSH 100 UNIT/ML IV SOLN
500.0000 [IU] | Freq: Once | INTRAVENOUS | Status: AC | PRN
  Administered 2014-03-20: 500 [IU]
  Filled 2014-03-20: qty 5

## 2014-03-22 ENCOUNTER — Telehealth: Payer: Self-pay | Admitting: *Deleted

## 2014-03-22 NOTE — Telephone Encounter (Signed)
Per POF scheduled appt. 

## 2014-04-22 ENCOUNTER — Ambulatory Visit: Payer: BC Managed Care – PPO

## 2014-04-22 ENCOUNTER — Encounter: Payer: Self-pay | Admitting: Oncology

## 2014-04-22 ENCOUNTER — Telehealth: Payer: Self-pay | Admitting: Oncology

## 2014-04-22 ENCOUNTER — Other Ambulatory Visit (HOSPITAL_BASED_OUTPATIENT_CLINIC_OR_DEPARTMENT_OTHER): Payer: BC Managed Care – PPO

## 2014-04-22 ENCOUNTER — Telehealth: Payer: Self-pay | Admitting: *Deleted

## 2014-04-22 ENCOUNTER — Ambulatory Visit (HOSPITAL_BASED_OUTPATIENT_CLINIC_OR_DEPARTMENT_OTHER): Payer: BC Managed Care – PPO | Admitting: Oncology

## 2014-04-22 VITALS — BP 120/78 | HR 79 | Temp 98.4°F | Resp 18 | Wt 188.0 lb

## 2014-04-22 DIAGNOSIS — C787 Secondary malignant neoplasm of liver and intrahepatic bile duct: Secondary | ICD-10-CM

## 2014-04-22 DIAGNOSIS — C19 Malignant neoplasm of rectosigmoid junction: Secondary | ICD-10-CM

## 2014-04-22 DIAGNOSIS — C7970 Secondary malignant neoplasm of unspecified adrenal gland: Secondary | ICD-10-CM

## 2014-04-22 DIAGNOSIS — C189 Malignant neoplasm of colon, unspecified: Secondary | ICD-10-CM

## 2014-04-22 DIAGNOSIS — Z95828 Presence of other vascular implants and grafts: Secondary | ICD-10-CM

## 2014-04-22 LAB — CBC WITH DIFFERENTIAL/PLATELET
BASO%: 0.6 % (ref 0.0–2.0)
Basophils Absolute: 0 10*3/uL (ref 0.0–0.1)
EOS%: 4.6 % (ref 0.0–7.0)
Eosinophils Absolute: 0.3 10*3/uL (ref 0.0–0.5)
HCT: 39 % (ref 38.4–49.9)
HGB: 13.1 g/dL (ref 13.0–17.1)
LYMPH#: 1.1 10*3/uL (ref 0.9–3.3)
LYMPH%: 14.9 % (ref 14.0–49.0)
MCH: 29.3 pg (ref 27.2–33.4)
MCHC: 33.6 g/dL (ref 32.0–36.0)
MCV: 87.4 fL (ref 79.3–98.0)
MONO#: 0.9 10*3/uL (ref 0.1–0.9)
MONO%: 12.5 % (ref 0.0–14.0)
NEUT#: 4.8 10*3/uL (ref 1.5–6.5)
NEUT%: 67.4 % (ref 39.0–75.0)
Platelets: 211 10*3/uL (ref 140–400)
RBC: 4.46 10*6/uL (ref 4.20–5.82)
RDW: 14.1 % (ref 11.0–14.6)
WBC: 7.1 10*3/uL (ref 4.0–10.3)

## 2014-04-22 LAB — COMPREHENSIVE METABOLIC PANEL (CC13)
ALT: 14 U/L (ref 0–55)
AST: 17 U/L (ref 5–34)
Albumin: 3.7 g/dL (ref 3.5–5.0)
Alkaline Phosphatase: 94 U/L (ref 40–150)
Anion Gap: 8 mEq/L (ref 3–11)
BUN: 14 mg/dL (ref 7.0–26.0)
CALCIUM: 9.4 mg/dL (ref 8.4–10.4)
CHLORIDE: 105 meq/L (ref 98–109)
CO2: 27 mEq/L (ref 22–29)
CREATININE: 0.8 mg/dL (ref 0.7–1.3)
Glucose: 113 mg/dl (ref 70–140)
POTASSIUM: 3.7 meq/L (ref 3.5–5.1)
Sodium: 140 mEq/L (ref 136–145)
TOTAL PROTEIN: 6.8 g/dL (ref 6.4–8.3)
Total Bilirubin: 0.9 mg/dL (ref 0.20–1.20)

## 2014-04-22 LAB — CEA: CEA: 393.9 ng/mL — ABNORMAL HIGH (ref 0.0–5.0)

## 2014-04-22 MED ORDER — SODIUM CHLORIDE 0.9 % IJ SOLN
10.0000 mL | INTRAMUSCULAR | Status: DC | PRN
  Administered 2014-04-22: 10 mL via INTRAVENOUS
  Filled 2014-04-22: qty 10

## 2014-04-22 MED ORDER — HEPARIN SOD (PORK) LOCK FLUSH 100 UNIT/ML IV SOLN
500.0000 [IU] | Freq: Once | INTRAVENOUS | Status: AC
  Administered 2014-04-22: 500 [IU] via INTRAVENOUS
  Filled 2014-04-22: qty 5

## 2014-04-22 NOTE — Progress Notes (Signed)
Hematology and Oncology Follow Up Visit  Christopher Jimenez [id number] Jan 28, 1951 63 y.o. 04/22/2014 9:23 AM  Christopher Jimenez, MDRoss, Christopher Haste, MD   Principle Diagnosis: A 63 year old gentleman diagnosed with a T3 N1 rectosigmoid adenocarcinoma. He had 1/14 lymph nodes involved diagnosed in December 2010. 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  Prior Therapy: 1. Underwent a lower anterior resection done in January 2012. 2. Underwent adjuvant radiation therapy with Xeloda concluded in March 2011. 3. Received adjuvant FOLFOX therapy concluded in July 2012. 4. The patient had involvement of his adrenal gland and underwent adrenalectomy on April 2012 under the care of Dr. Excell Jimenez. 5. He is status post a microwave ablation of the hepatic metastasis done July 06, 2011.       6.    FOLFIRI/Avastin started on 07/21/12. He has been treated with that intermittently total June of 2015. Due to the progression of disease  Current therapy: He is status post the first cycle of Vectibix as a single agent. Started on 03/18/2014.  Interim History:  [name] presents today for a followup visit by himself. Since his last chemotherapy in visit, he has been doing relatively well. He is did develop grade 2 to acneform rash that resolved spontaneously. He does not report any other complications. He does report some occasional wheezing as well. He denied any cough, hemoptysis or hematemesis. He's not had any fevers, chills, headaches visual changes. Denied any neurologic symptoms. Has not reported any infusion-related complications. His energy level remains excellent. He still commutes from the [facility]. He does not report any headaches or blurry vision or double vision. Does not report any chest pain shortness of breath cough or hemoptysis. Does report any nausea or vomiting or abdominal pain. Does not report any frequency urgency or hesitancy. He does report some erectile dysfunction but no dysuria or drainage. She  does not reported skin rashes or lymphadenopathy. Rest of his review of systems unremarkable.   Medications: I have reviewed the patient's current medications.  Current Outpatient Prescriptions  Medication Sig Dispense Refill  . Alum & Mag Hydroxide-Simeth (MAGIC MOUTHWASH W/LIDOCAINE) SOLN Take 5 mLs by mouth 3 (three) times daily as needed.  240 mL  1  . sildenafil (VIAGRA) 50 MG tablet Take 1 tablet (50 mg total) by mouth daily as needed for erectile dysfunction.  10 tablet  1   Current Facility-Administered Medications  Medication Dose Route Frequency Provider Last Rate Last Dose  . sodium chloride 0.9 % injection 10 mL  10 mL Intravenous PRN Christopher Portela, MD   10 mL at 04/22/14 0919    Allergies: No Known Allergies  Past Medical History, Surgical history, Social history, and Family History were reviewed and updated.    Physical Exam: Blood pressure 120/78, pulse 79, temperature 98.4 F (36.9 C), temperature source Oral, resp. rate 18, weight 188 lb (85.276 kg).  ECOG: 1  General appearance: alert and cooperative Head: Normocephalic, without obvious abnormality, atraumatic Neck: no adenopathy Lymph nodes: Cervical, supraclavicular, and axillary nodes normal. Heart:regular rate and rhythm, S1, S2 normal, no murmur, click, rub or gallop Lung:chest clear, no wheezing, rales, normal symmetric air entry, no tachypnea, retractions or cyanosis Abdomen: soft, non-tender, without masses or organomegaly EXT:no erythema, induration, or nodules Skin: Very faint acneiform rash noted on his face but not on the trunk or back.  Lab Results: Lab Results  Component Value Date   WBC 7.1 04/22/2014   HGB 13.1 04/22/2014   HCT 39.0 04/22/2014  MCV 87.4 04/22/2014   PLT 211 04/22/2014     Chemistry      Component Value Date/Time   NA 139 03/18/2014 0908   NA 142 02/19/2012 0910   NA 140 12/20/2011 1016   K 4.1 03/18/2014 0908   K 5.0* 02/19/2012 0910   K 4.5 12/20/2011 1016   CL 105  02/24/2013 1317   CL 99 02/19/2012 0910   CL 105 12/20/2011 1016   CO2 24 03/18/2014 0908   CO2 30 02/19/2012 0910   CO2 27 12/20/2011 1016   BUN 13.1 03/18/2014 0908   BUN 14 02/19/2012 0910   BUN 18 12/20/2011 1016   CREATININE 0.9 03/18/2014 0908   CREATININE 1.0 02/19/2012 0910   CREATININE 0.90 12/20/2011 1016      Component Value Date/Time   CALCIUM 9.5 03/18/2014 0908   CALCIUM 8.5 02/19/2012 0910   CALCIUM 9.0 12/20/2011 1016   ALKPHOS 97 03/18/2014 0908   ALKPHOS 53 02/19/2012 0910   ALKPHOS 59 12/20/2011 1016   AST 15 03/18/2014 0908   AST 21 02/19/2012 0910   AST 15 12/20/2011 1016   ALT 16 03/18/2014 0908   ALT 24 02/19/2012 0910   ALT 13 12/20/2011 1016   BILITOT 0.93 03/18/2014 0908   BILITOT 0.80 02/19/2012 0910   BILITOT 0.5 12/20/2011 1016       Impression and Plan: This is a pleasant 63 year old gentleman with the following issues:  1. Stage IV colorectal cancer. 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 He had a liver metastasis that has been ablated. His CT scan from 03/12/2014 showed progression of disease predominantly in his pulmonary nodules. He received single agent Vectibix without any complications. He would like to resume systemic chemotherapy on September 9 as he is traveling to [place] state and would like to hold on chemotherapy until that time. I will add low dose CPT 11 at 180 mg per meter square for his next chemotherapy if he can tolerate it. 2. Erectile dysfunction: Seems to be improved with Viagra. 3. Port-A-Cath management. This was flushed today. 4. Follow-up. Will be in September of 2015 for an evaluation before his next chemotherapy.     BSWHQP,RFFMB 7/30/20159:23 AM

## 2014-04-22 NOTE — Telephone Encounter (Signed)
Pt confirmed labs/ov, sent LC to add chemo per 07/30 POF, gave pt AVS...Marland KitchenMarland KitchenKJ

## 2014-04-22 NOTE — Telephone Encounter (Signed)
Per POF staff message scheduled appts. Advised scheduler 

## 2014-04-22 NOTE — Patient Instructions (Signed)

## 2014-06-02 ENCOUNTER — Ambulatory Visit (HOSPITAL_BASED_OUTPATIENT_CLINIC_OR_DEPARTMENT_OTHER): Payer: BC Managed Care – PPO | Admitting: Oncology

## 2014-06-02 ENCOUNTER — Other Ambulatory Visit (HOSPITAL_BASED_OUTPATIENT_CLINIC_OR_DEPARTMENT_OTHER): Payer: BC Managed Care – PPO

## 2014-06-02 ENCOUNTER — Encounter: Payer: Self-pay | Admitting: Oncology

## 2014-06-02 ENCOUNTER — Telehealth: Payer: Self-pay | Admitting: Oncology

## 2014-06-02 ENCOUNTER — Ambulatory Visit (HOSPITAL_BASED_OUTPATIENT_CLINIC_OR_DEPARTMENT_OTHER): Payer: BC Managed Care – PPO

## 2014-06-02 VITALS — BP 117/76 | HR 85 | Temp 97.8°F | Resp 18 | Ht 73.0 in | Wt 181.7 lb

## 2014-06-02 DIAGNOSIS — C7970 Secondary malignant neoplasm of unspecified adrenal gland: Secondary | ICD-10-CM

## 2014-06-02 DIAGNOSIS — C7931 Secondary malignant neoplasm of brain: Secondary | ICD-10-CM

## 2014-06-02 DIAGNOSIS — C7800 Secondary malignant neoplasm of unspecified lung: Secondary | ICD-10-CM

## 2014-06-02 DIAGNOSIS — C19 Malignant neoplasm of rectosigmoid junction: Secondary | ICD-10-CM

## 2014-06-02 DIAGNOSIS — Z5112 Encounter for antineoplastic immunotherapy: Secondary | ICD-10-CM

## 2014-06-02 DIAGNOSIS — C189 Malignant neoplasm of colon, unspecified: Secondary | ICD-10-CM

## 2014-06-02 DIAGNOSIS — C20 Malignant neoplasm of rectum: Secondary | ICD-10-CM

## 2014-06-02 DIAGNOSIS — Z5111 Encounter for antineoplastic chemotherapy: Secondary | ICD-10-CM

## 2014-06-02 DIAGNOSIS — C787 Secondary malignant neoplasm of liver and intrahepatic bile duct: Secondary | ICD-10-CM

## 2014-06-02 DIAGNOSIS — C7949 Secondary malignant neoplasm of other parts of nervous system: Secondary | ICD-10-CM

## 2014-06-02 LAB — CBC WITH DIFFERENTIAL/PLATELET
BASO%: 0.2 % (ref 0.0–2.0)
BASOS ABS: 0 10*3/uL (ref 0.0–0.1)
EOS%: 2.3 % (ref 0.0–7.0)
Eosinophils Absolute: 0.2 10*3/uL (ref 0.0–0.5)
HEMATOCRIT: 37.9 % — AB (ref 38.4–49.9)
HEMOGLOBIN: 12.9 g/dL — AB (ref 13.0–17.1)
LYMPH#: 0.7 10*3/uL — AB (ref 0.9–3.3)
LYMPH%: 8.8 % — ABNORMAL LOW (ref 14.0–49.0)
MCH: 29.1 pg (ref 27.2–33.4)
MCHC: 34 g/dL (ref 32.0–36.0)
MCV: 85.6 fL (ref 79.3–98.0)
MONO#: 1.5 10*3/uL — AB (ref 0.1–0.9)
MONO%: 17.7 % — ABNORMAL HIGH (ref 0.0–14.0)
NEUT#: 5.9 10*3/uL (ref 1.5–6.5)
NEUT%: 71 % (ref 39.0–75.0)
Platelets: 224 10*3/uL (ref 140–400)
RBC: 4.43 10*6/uL (ref 4.20–5.82)
RDW: 13.3 % (ref 11.0–14.6)
WBC: 8.3 10*3/uL (ref 4.0–10.3)

## 2014-06-02 LAB — CEA: CEA: 741.3 ng/mL — ABNORMAL HIGH (ref 0.0–5.0)

## 2014-06-02 LAB — COMPREHENSIVE METABOLIC PANEL (CC13)
ALBUMIN: 3.5 g/dL (ref 3.5–5.0)
ALT: 13 U/L (ref 0–55)
AST: 13 U/L (ref 5–34)
Alkaline Phosphatase: 117 U/L (ref 40–150)
Anion Gap: 12 mEq/L — ABNORMAL HIGH (ref 3–11)
BUN: 13.5 mg/dL (ref 7.0–26.0)
CHLORIDE: 103 meq/L (ref 98–109)
CO2: 24 mEq/L (ref 22–29)
Calcium: 9.7 mg/dL (ref 8.4–10.4)
Creatinine: 0.9 mg/dL (ref 0.7–1.3)
GLUCOSE: 119 mg/dL (ref 70–140)
POTASSIUM: 3.8 meq/L (ref 3.5–5.1)
Sodium: 138 mEq/L (ref 136–145)
Total Bilirubin: 1.21 mg/dL — ABNORMAL HIGH (ref 0.20–1.20)
Total Protein: 7.7 g/dL (ref 6.4–8.3)

## 2014-06-02 MED ORDER — SODIUM CHLORIDE 0.9 % IV SOLN
Freq: Once | INTRAVENOUS | Status: AC
  Administered 2014-06-02: 10:00:00 via INTRAVENOUS

## 2014-06-02 MED ORDER — DEXAMETHASONE SODIUM PHOSPHATE 20 MG/5ML IJ SOLN
20.0000 mg | Freq: Once | INTRAMUSCULAR | Status: AC
  Administered 2014-06-02: 20 mg via INTRAVENOUS

## 2014-06-02 MED ORDER — DEXTROSE 5 % IV SOLN
180.0000 mg/m2 | Freq: Once | INTRAVENOUS | Status: AC
  Administered 2014-06-02: 378 mg via INTRAVENOUS
  Filled 2014-06-02: qty 18.9

## 2014-06-02 MED ORDER — HEPARIN SOD (PORK) LOCK FLUSH 100 UNIT/ML IV SOLN
500.0000 [IU] | Freq: Once | INTRAVENOUS | Status: AC | PRN
  Administered 2014-06-02: 500 [IU]
  Filled 2014-06-02: qty 5

## 2014-06-02 MED ORDER — ONDANSETRON 16 MG/50ML IVPB (CHCC)
INTRAVENOUS | Status: AC
  Filled 2014-06-02: qty 16

## 2014-06-02 MED ORDER — ONDANSETRON 16 MG/50ML IVPB (CHCC)
16.0000 mg | Freq: Once | INTRAVENOUS | Status: AC
  Administered 2014-06-02: 16 mg via INTRAVENOUS

## 2014-06-02 MED ORDER — SODIUM CHLORIDE 0.9 % IV SOLN
6.0000 mg/kg | Freq: Once | INTRAVENOUS | Status: AC
  Administered 2014-06-02: 520 mg via INTRAVENOUS
  Filled 2014-06-02: qty 26

## 2014-06-02 MED ORDER — DEXAMETHASONE SODIUM PHOSPHATE 20 MG/5ML IJ SOLN
INTRAMUSCULAR | Status: AC
  Filled 2014-06-02: qty 5

## 2014-06-02 MED ORDER — SODIUM CHLORIDE 0.9 % IJ SOLN
10.0000 mL | INTRAMUSCULAR | Status: DC | PRN
  Administered 2014-06-02: 10 mL
  Filled 2014-06-02: qty 10

## 2014-06-02 NOTE — Patient Instructions (Signed)
[facility] Discharge Instructions for Patients Receiving Chemotherapy  Today you received the following chemotherapy agents Vectibix and Camptosar.  To help prevent nausea and vomiting after your treatment, we encourage you to take your nausea medication.   If you develop nausea and vomiting that is not controlled by your nausea medication, call the clinic.   BELOW ARE SYMPTOMS THAT SHOULD BE REPORTED IMMEDIATELY:  *FEVER GREATER THAN 100.5 F  *CHILLS WITH OR WITHOUT FEVER  NAUSEA AND VOMITING THAT IS NOT CONTROLLED WITH YOUR NAUSEA MEDICATION  *UNUSUAL SHORTNESS OF BREATH  *UNUSUAL BRUISING OR BLEEDING  TENDERNESS IN MOUTH AND THROAT WITH OR WITHOUT PRESENCE OF ULCERS  *URINARY PROBLEMS  *BOWEL PROBLEMS  UNUSUAL RASH Items with * indicate a potential emergency and should be followed up as soon as possible.  Feel free to call the clinic you have any questions or concerns. The clinic phone number is [phone].

## 2014-06-02 NOTE — Progress Notes (Signed)
Hematology and Oncology Follow Up Visit  Christopher Jimenez [id number] 10/18/1950 63 y.o. 06/02/2014 10:30 AM  Christopher Jimenez, MDRoss, Christopher Haste, MD   Principle Diagnosis: A 63 year old gentleman diagnosed with a T3 N1 rectosigmoid adenocarcinoma. He had 1/14 lymph nodes involved diagnosed in December 2010. 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  Prior Therapy: 1. Underwent a lower anterior resection done in January 2012. 2. Underwent adjuvant radiation therapy with Xeloda concluded in March 2011. 3. Received adjuvant FOLFOX therapy concluded in July 2012. 4. The patient had involvement of his adrenal gland and underwent adrenalectomy on April 2012 under the care of Dr. Excell Seltzer. 5. He is status post a microwave ablation of the hepatic metastasis done July 06, 2011.       6.    FOLFIRI/Avastin started on 07/21/12. He has been treated with that intermittently total June of 2015. Due to the progression of disease  Current therapy: He is status post the first cycle of Vectibix as a single agent. Started on 03/18/2014. He is to receive CPT-11 with her next infusion on 06/02/2014  Interim History:  Christopher Jimenez presents today for a followup visit by himself. Since his last chemotherapy in visit, he has been doing relatively well. He is did develop grade 2 to acneform rash that resolved spontaneously. He is reporting right-sided chest wall discomfort in the last 2 days. Head have responded well to Advil with complete resolution of the symptoms. His report any cough or fevers. Did not report any hemoptysis. He does not report any other complications. He does report some occasional wheezing as well. He denied any cough, hemoptysis or hematemesis. He's not had any fevers, chills, headaches visual changes. Denied any neurologic symptoms. Has not reported any infusion-related complications. His energy level remains excellent. He still commutes from the [facility]. He does not report any headaches or blurry  vision or double vision. Does not report any chest pain shortness of breath cough or hemoptysis. Does report any nausea or vomiting or abdominal pain. Does not report any frequency urgency or hesitancy. He does report some erectile dysfunction but no dysuria or drainage. She does not reported skin rashes or lymphadenopathy. Rest of his review of systems unremarkable.   Medications: I have reviewed the patient's current medications.  Current Outpatient Prescriptions  Medication Sig Dispense Refill  . Alum & Mag Hydroxide-Simeth (MAGIC MOUTHWASH W/LIDOCAINE) SOLN Take 5 mLs by mouth 3 (three) times daily as needed.  240 mL  1  . sildenafil (VIAGRA) 50 MG tablet Take 1 tablet (50 mg total) by mouth daily as needed for erectile dysfunction.  10 tablet  1   No current facility-administered medications for this visit.   Facility-Administered Medications Ordered in Other Visits  Medication Dose Route Frequency Provider Last Rate Last Dose  . heparin lock flush 100 unit/mL  500 Units Intracatheter Once PRN Wyatt Portela, MD      . irinotecan (CAMPTOSAR) 378 mg in dextrose 5 % 500 mL chemo infusion  180 mg/m2 (Treatment Plan Actual) Intravenous Once Wyatt Portela, MD      . panitumumab (VECTIBIX) 520 mg in sodium chloride 0.9 % 100 mL chemo infusion  6 mg/kg (Treatment Plan Actual) Intravenous Once Wyatt Portela, MD 126 mL/hr at 06/02/14 1015 520 mg at 06/02/14 1015  . sodium chloride 0.9 % injection 10 mL  10 mL Intracatheter PRN Wyatt Portela, MD        Allergies: No Known Allergies  Past Medical History, Surgical  history, Social history, and Family History were reviewed and updated.    Physical Exam: Blood pressure 117/76, pulse 85, temperature 97.8 F (36.6 C), temperature source Oral, resp. rate 18, height 6\' 1"  (1.854 m), weight 181 lb 11.2 oz (82.419 kg).  ECOG: 1  General appearance: alert and cooperative Head: Normocephalic, without obvious abnormality, atraumatic Neck: no  adenopathy Lymph nodes: Cervical, supraclavicular, and axillary nodes normal. Heart:regular rate and rhythm, S1, S2 normal, no murmur, click, rub or gallop Lung:chest clear, no wheezing, rales, normal symmetric air entry, no tachypnea, retractions or cyanosis Abdomen: soft, non-tender, without masses or organomegaly EXT:no erythema, induration, or nodules Skin: Very faint acneiform rash noted on his face but not on the trunk or back.  Lab Results: Lab Results  Component Value Date   WBC 8.3 06/02/2014   HGB 12.9* 06/02/2014   HCT 37.9* 06/02/2014   MCV 85.6 06/02/2014   PLT 224 06/02/2014     Chemistry      Component Value Date/Time   NA 138 06/02/2014 0839   NA 142 02/19/2012 0910   NA 140 12/20/2011 1016   K 3.8 06/02/2014 0839   K 5.0* 02/19/2012 0910   K 4.5 12/20/2011 1016   CL 105 02/24/2013 1317   CL 99 02/19/2012 0910   CL 105 12/20/2011 1016   CO2 24 06/02/2014 0839   CO2 30 02/19/2012 0910   CO2 27 12/20/2011 1016   BUN 13.5 06/02/2014 0839   BUN 14 02/19/2012 0910   BUN 18 12/20/2011 1016   CREATININE 0.9 06/02/2014 0839   CREATININE 1.0 02/19/2012 0910   CREATININE 0.90 12/20/2011 1016      Component Value Date/Time   CALCIUM 9.7 06/02/2014 0839   CALCIUM 8.5 02/19/2012 0910   CALCIUM 9.0 12/20/2011 1016   ALKPHOS 117 06/02/2014 0839   ALKPHOS 53 02/19/2012 0910   ALKPHOS 59 12/20/2011 1016   AST 13 06/02/2014 0839   AST 21 02/19/2012 0910   AST 15 12/20/2011 1016   ALT 13 06/02/2014 0839   ALT 24 02/19/2012 0910   ALT 13 12/20/2011 1016   BILITOT 1.21* 06/02/2014 0839   BILITOT 0.80 02/19/2012 0910   BILITOT 0.5 12/20/2011 1016       Impression and Plan: This is a pleasant 63 year old gentleman with the following issues:  1. Stage IV colorectal cancer. 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 He had a liver metastasis that has been ablated. His CT scan from 03/12/2014 showed progression of disease predominantly in his pulmonary nodules. He received single agent Vectibix without any  complications. He is ready to proceed with chemotherapy today and we'll add low dose CPT 11 as well. His next chemotherapy will be scheduled in October 2050. 2. Erectile dysfunction: Seems to be improved with Viagra. 3. Port-A-Cath management. This will be used for systemic chemotherapy. 4. Follow-up. Will be in October of 2015 for an evaluation before his next chemotherapy.     FKCLEX,NTZGY 9/9/201510:30 AM

## 2014-06-02 NOTE — Telephone Encounter (Signed)
gv and printed appt sched and avs for pt for OCT..sed added tx. °

## 2014-06-11 ENCOUNTER — Encounter: Payer: Self-pay | Admitting: *Deleted

## 2014-06-13 ENCOUNTER — Inpatient Hospital Stay (HOSPITAL_COMMUNITY): Payer: BC Managed Care – PPO

## 2014-06-13 ENCOUNTER — Encounter (HOSPITAL_COMMUNITY): Payer: Self-pay | Admitting: Emergency Medicine

## 2014-06-13 ENCOUNTER — Inpatient Hospital Stay (HOSPITAL_COMMUNITY)
Admission: EM | Admit: 2014-06-13 | Discharge: 2014-06-16 | DRG: 871 | Disposition: A | Discharge status: Hospice | Payer: BC Managed Care – PPO | Attending: Internal Medicine | Admitting: Internal Medicine

## 2014-06-13 DIAGNOSIS — D702 Other drug-induced agranulocytosis: Secondary | ICD-10-CM | POA: Diagnosis present

## 2014-06-13 DIAGNOSIS — Z79899 Other long term (current) drug therapy: Secondary | ICD-10-CM | POA: Diagnosis not present

## 2014-06-13 DIAGNOSIS — Z933 Colostomy status: Secondary | ICD-10-CM

## 2014-06-13 DIAGNOSIS — Z87891 Personal history of nicotine dependence: Secondary | ICD-10-CM

## 2014-06-13 DIAGNOSIS — I4891 Unspecified atrial fibrillation: Secondary | ICD-10-CM | POA: Diagnosis present

## 2014-06-13 DIAGNOSIS — C7800 Secondary malignant neoplasm of unspecified lung: Secondary | ICD-10-CM | POA: Diagnosis present

## 2014-06-13 DIAGNOSIS — D709 Neutropenia, unspecified: Secondary | ICD-10-CM | POA: Diagnosis present

## 2014-06-13 DIAGNOSIS — C801 Malignant (primary) neoplasm, unspecified: Secondary | ICD-10-CM

## 2014-06-13 DIAGNOSIS — E876 Hypokalemia: Secondary | ICD-10-CM | POA: Diagnosis present

## 2014-06-13 DIAGNOSIS — D61818 Other pancytopenia: Secondary | ICD-10-CM

## 2014-06-13 DIAGNOSIS — C787 Secondary malignant neoplasm of liver and intrahepatic bile duct: Secondary | ICD-10-CM

## 2014-06-13 DIAGNOSIS — A4151 Sepsis due to Escherichia coli [E. coli]: Principal | ICD-10-CM

## 2014-06-13 DIAGNOSIS — E871 Hypo-osmolality and hyponatremia: Secondary | ICD-10-CM

## 2014-06-13 DIAGNOSIS — C7970 Secondary malignant neoplasm of unspecified adrenal gland: Secondary | ICD-10-CM | POA: Diagnosis present

## 2014-06-13 DIAGNOSIS — C19 Malignant neoplasm of rectosigmoid junction: Secondary | ICD-10-CM | POA: Diagnosis present

## 2014-06-13 DIAGNOSIS — A419 Sepsis, unspecified organism: Secondary | ICD-10-CM

## 2014-06-13 DIAGNOSIS — E43 Unspecified severe protein-calorie malnutrition: Secondary | ICD-10-CM | POA: Diagnosis present

## 2014-06-13 DIAGNOSIS — R5081 Fever presenting with conditions classified elsewhere: Secondary | ICD-10-CM | POA: Diagnosis present

## 2014-06-13 DIAGNOSIS — T451X5A Adverse effect of antineoplastic and immunosuppressive drugs, initial encounter: Secondary | ICD-10-CM | POA: Diagnosis present

## 2014-06-13 DIAGNOSIS — C799 Secondary malignant neoplasm of unspecified site: Secondary | ICD-10-CM

## 2014-06-13 DIAGNOSIS — IMO0002 Reserved for concepts with insufficient information to code with codable children: Secondary | ICD-10-CM

## 2014-06-13 DIAGNOSIS — I369 Nonrheumatic tricuspid valve disorder, unspecified: Secondary | ICD-10-CM

## 2014-06-13 DIAGNOSIS — Z515 Encounter for palliative care: Secondary | ICD-10-CM

## 2014-06-13 DIAGNOSIS — Z66 Do not resuscitate: Secondary | ICD-10-CM | POA: Diagnosis present

## 2014-06-13 LAB — CBC WITH DIFFERENTIAL/PLATELET
BASOS ABS: 0 10*3/uL (ref 0.0–0.1)
Basophils Relative: 0 % (ref 0–1)
Eosinophils Absolute: 0 10*3/uL (ref 0.0–0.7)
Eosinophils Relative: 4 % (ref 0–5)
HCT: 32.1 % — ABNORMAL LOW (ref 39.0–52.0)
Hemoglobin: 11.6 g/dL — ABNORMAL LOW (ref 13.0–17.0)
LYMPHS PCT: 38 % (ref 12–46)
Lymphs Abs: 0.4 10*3/uL — ABNORMAL LOW (ref 0.7–4.0)
MCH: 28.1 pg (ref 26.0–34.0)
MCHC: 36.1 g/dL — AB (ref 30.0–36.0)
MCV: 77.7 fL — ABNORMAL LOW (ref 78.0–100.0)
Monocytes Absolute: 0.3 10*3/uL (ref 0.1–1.0)
Monocytes Relative: 25 % — ABNORMAL HIGH (ref 3–12)
NEUTROS ABS: 0.4 10*3/uL — AB (ref 1.7–7.7)
NEUTROS PCT: 34 % — AB (ref 43–77)
PLATELETS: 110 10*3/uL — AB (ref 150–400)
RBC: 4.13 MIL/uL — ABNORMAL LOW (ref 4.22–5.81)
RDW: 12.6 % (ref 11.5–15.5)
WBC: 1.1 10*3/uL — AB (ref 4.0–10.5)

## 2014-06-13 LAB — BASIC METABOLIC PANEL
ANION GAP: 15 (ref 5–15)
BUN: 18 mg/dL (ref 6–23)
CHLORIDE: 96 meq/L (ref 96–112)
CO2: 21 meq/L (ref 19–32)
CREATININE: 0.81 mg/dL (ref 0.50–1.35)
Calcium: 8.9 mg/dL (ref 8.4–10.5)
GFR calc non Af Amer: >90 mL/min (ref >90)
Glucose, Bld: 130 mg/dL — ABNORMAL HIGH (ref 70–99)
Potassium: 4.4 mEq/L (ref 3.7–5.3)
SODIUM: 132 meq/L — AB (ref 137–147)

## 2014-06-13 LAB — URINALYSIS, ROUTINE W REFLEX MICROSCOPIC
Bilirubin Urine: NEGATIVE
Glucose, UA: NEGATIVE mg/dL
Ketones, ur: 15 mg/dL — AB
LEUKOCYTES UA: NEGATIVE
NITRITE: NEGATIVE
PH: 5.5 (ref 5.0–8.0)
Protein, ur: 100 mg/dL — AB
SPECIFIC GRAVITY, URINE: 1.023 (ref 1.005–1.030)
Urobilinogen, UA: 0.2 mg/dL (ref 0.0–1.0)

## 2014-06-13 LAB — URINE MICROSCOPIC-ADD ON

## 2014-06-13 LAB — CLOSTRIDIUM DIFFICILE BY PCR: Toxigenic C. Difficile by PCR: NEGATIVE

## 2014-06-13 LAB — TSH: TSH: 0.412 u[IU]/mL (ref 0.350–4.500)

## 2014-06-13 MED ORDER — ONDANSETRON HCL 4 MG PO TABS: 4.0000 mg | ORAL_TABLET | Freq: Four times a day (QID) | ORAL | Status: DC | PRN

## 2014-06-13 MED ORDER — SODIUM CHLORIDE 0.9 % IV SOLN
INTRAVENOUS | Status: AC
  Administered 2014-06-13 – 2014-06-14 (×4): via INTRAVENOUS

## 2014-06-13 MED ORDER — ACETAMINOPHEN 325 MG PO TABS: 650.0000 mg | ORAL_TABLET | Freq: Four times a day (QID) | ORAL | Status: DC | PRN

## 2014-06-13 MED ORDER — ONDANSETRON HCL 4 MG/2ML IJ SOLN: 4.0000 mg | Freq: Four times a day (QID) | INTRAMUSCULAR | Status: DC | PRN

## 2014-06-13 MED ORDER — ALUM & MAG HYDROXIDE-SIMETH 200-200-20 MG/5ML PO SUSP: 30.0000 mL | Freq: Four times a day (QID) | ORAL | Status: DC | PRN

## 2014-06-13 MED ORDER — SODIUM CHLORIDE 0.9 % IV SOLN
500.0000 mg | Freq: Four times a day (QID) | INTRAVENOUS | Status: DC
  Administered 2014-06-13 – 2014-06-15 (×9): 500 mg via INTRAVENOUS
  Filled 2014-06-13 (×10): qty 500

## 2014-06-13 MED ORDER — DRONABINOL 2.5 MG PO CAPS
5.0000 mg | ORAL_CAPSULE | Freq: Two times a day (BID) | ORAL | Status: DC
  Administered 2014-06-13 – 2014-06-16 (×7): 5 mg via ORAL
  Filled 2014-06-13 (×8): qty 2

## 2014-06-13 MED ORDER — ENOXAPARIN SODIUM 30 MG/0.3ML ~~LOC~~ SOLN: 30.0000 mg | SUBCUTANEOUS | Status: DC

## 2014-06-13 MED ORDER — OXYCODONE HCL 5 MG PO TABS: 5.0000 mg | ORAL_TABLET | ORAL | Status: DC | PRN

## 2014-06-13 MED ORDER — IMIPENEM-CILASTATIN 500 MG IV SOLR: 500.0000 mg | Freq: Three times a day (TID) | INTRAVENOUS | Status: DC

## 2014-06-13 MED ORDER — SODIUM CHLORIDE 0.9 % IV SOLN: INTRAVENOUS | Status: DC

## 2014-06-13 MED ORDER — ACETAMINOPHEN 650 MG RE SUPP: 650.0000 mg | Freq: Four times a day (QID) | RECTAL | Status: DC | PRN

## 2014-06-13 MED ORDER — SODIUM CHLORIDE 0.9 % IV BOLUS (SEPSIS)
1000.0000 mL | Freq: Once | INTRAVENOUS | Status: AC
  Administered 2014-06-13: 1000 mL via INTRAVENOUS

## 2014-06-13 MED ORDER — HYDROMORPHONE HCL 1 MG/ML IJ SOLN: 0.5000 mg | INTRAMUSCULAR | Status: DC | PRN

## 2014-06-13 MED ORDER — ENOXAPARIN SODIUM 40 MG/0.4ML ~~LOC~~ SOLN
40.0000 mg | SUBCUTANEOUS | Status: DC
  Administered 2014-06-13 – 2014-06-16 (×4): 40 mg via SUBCUTANEOUS
  Filled 2014-06-13 (×4): qty 0.4

## 2014-06-13 MED ORDER — TBO-FILGRASTIM 480 MCG/0.8ML ~~LOC~~ SOSY
480.0000 ug | PREFILLED_SYRINGE | Freq: Once | SUBCUTANEOUS | Status: AC
  Administered 2014-06-13: 480 ug via SUBCUTANEOUS
  Filled 2014-06-13: qty 0.8

## 2014-06-13 MED ORDER — TBO-FILGRASTIM 480 MCG/0.8ML ~~LOC~~ SOSY
480.0000 ug | PREFILLED_SYRINGE | Freq: Every day | SUBCUTANEOUS | Status: DC
  Filled 2014-06-13 (×2): qty 0.8

## 2014-06-13 MED ORDER — SODIUM CHLORIDE 0.9 % IJ SOLN
3.0000 mL | Freq: Two times a day (BID) | INTRAMUSCULAR | Status: DC
  Administered 2014-06-13 – 2014-06-15 (×2): 3 mL via INTRAVENOUS

## 2014-06-13 MED ORDER — SODIUM CHLORIDE 0.9 % IV SOLN
25.0000 mg | Freq: Once | INTRAVENOUS | Status: AC
  Administered 2014-06-13: 25 mg via INTRAVENOUS
  Filled 2014-06-13: qty 1

## 2014-06-13 MED ORDER — OXYCODONE HCL 5 MG PO TABS: 5.0000 mg | ORAL_TABLET | Freq: Four times a day (QID) | ORAL | Status: DC | PRN

## 2014-06-13 MED ORDER — SODIUM CHLORIDE 0.9 % IV SOLN
INTRAVENOUS | Status: DC
  Administered 2014-06-13: 125 mL/h via INTRAVENOUS

## 2014-06-13 MED ORDER — SODIUM CHLORIDE 0.9 % IV SOLN
500.0000 mg | Freq: Once | INTRAVENOUS | Status: AC
  Administered 2014-06-13: 500 mg via INTRAVENOUS
  Filled 2014-06-13: qty 500

## 2014-06-13 NOTE — Progress Notes (Signed)
PROGRESS NOTE    Christopher Jimenez ZJQ:[id number] DOB: [birth year] DOA: 06/13/2014 PCP:  Melinda Crutch, MD Primary Oncologist: Dr. Zola Button.  HPI/Brief narrative 63 year old male with history of stage IV colorectal cancer, adrenal metastasis that was resected, liver metastasis that has been ablated, pulmonary metastasis, s/p CVT-11 and Vectibix treatment on 06/02/14 presented to outside hospital ED due to fevers to 101.3F, chills, malaise, nausea, vomiting, poor appetite, increased diarrhea through colostomy. Evaluation day or revealed neutropenia. Blood cultures were drawn, patient was placed on IV Primaxin, case discussed with oncologist on call and patient was transferred to [facility] long ED. Outside hospital reported 3/5 blood cultures positive for GNR. At [facility] long ED, WBC 1.1 and absolute neutrophil count of 0.4. On the floor, he was noted to have new onset A. fib with RVR.  Assessment/Plan:  1. Severe GNR sepsis, present on admission: Source unclear. Check urine microscopy and culture, stool C. difficile PCR and GI pathogen panel PCR. Follow final blood culture results from outside hospital. Continue IV Primaxin and fluid resuscitation. Do not suspect line sepsis or mucositis and hence hold off on vancomycin. 2. Pancytopenia/febrile neutropenia: Secondary to recent chemotherapy. Treat sepsis as above. Supportive treatment. Transfuse if hemoglobin less than 7 g per DL. Continue Granix and monitor daily CBCs. No reported bleeding. 3. Severe dehydration with mild hyponatremia: Secondary to GI losses, poor oral intake and severe sepsis. Aggressively hydrate with IV normal saline. 4. New A. fib with RVR: Likely precipitated by acute illness and sepsis. Check 2-D echo. TSH low normal. Treat dehydration and sepsis aggressively and if persists with RVR, may consider rate control medications. Currently asymptomatic of chest pain, dyspnea or palpitations. CDADS 2 score: 0. May consider aspirin if platelets  remain stable. 5. Stage IV colorectal cancer , s/p adrenal mass resection, s/p liver metastasis ablation and pulmonary metastasis: Management per oncology. Chest x-ray shows increased right superior hilum and right upper lobe density suspicious for primary lung cancer with postobstructive changes-defer further evaluation to oncology.   Code Status: DO NOT RESUSCITATE Family Communication: Discussed with patient's son at bedside Disposition Plan: Home when medically stable.   Consultants:  Medical oncology  Procedures:  None  Antibiotics:  IV Primaxin 9/20 >  Subjective: Patient sleepy this morning. Arousable and complaints of intermittent palpitations and dyspnea but no cough. Denies pain. As per son, has had issues with ongoing nausea, vomiting, diarrhea and poor oral intake 4 days and hasn't slept well.  Objective: Filed Vitals:   06/13/14 0550 06/13/14 0622 06/13/14 0650 06/13/14 0756  BP: 116/72  99/72 100/70  Pulse: 90  131   Temp: 97.9 F (36.6 C)  97.9 F (36.6 C)   TempSrc: Oral  Oral   Resp: 18  20   Height:  6\' 1"  (1.854 m) 6' (1.829 m)   Weight:   78.1 kg (172 lb 2.9 oz)   SpO2: 96%  96%     Intake/Output Summary (Last 24 hours) at 06/13/14 1340 Last data filed at 06/13/14 1200  Gross per 24 hour  Intake   1400 ml  Output   1100 ml  Net    300 ml   Filed Weights   06/13/14 0650  Weight: 78.1 kg (172 lb 2.9 oz)     Exam:  General exam: Middle-aged moderately built and nourished ill looking male lying comfortably supine in bed. Oral mucosa dry. Respiratory system: Diminished breath sounds in the bases with occasional basal crackles otherwise the rest of lung fields clear to  auscultation. No increased work of breathing. Cardiovascular system: S1 & S2 heard, irregularly irregular and tachycardic. No JVD, murmurs, gallops, clicks or pedal edema. Telemetry: A. fib with RVR in the 160s. Gastrointestinal system: Abdomen is nondistended, soft and nontender.  Normal bowel sounds heard. Colostomy draining liquid green stools. Central nervous system: Sleepy but easily arousable and oriented x2. No focal neurological deficits. Extremities: Symmetric 5 x 5 power.   Data Reviewed: Basic Metabolic Panel:  Recent Labs Lab 06/13/14 0245  NA 132*  K 4.4  CL 96  CO2 21  GLUCOSE 130*  BUN 18  CREATININE 0.81  CALCIUM 8.9   Liver Function Tests: No results found for this basename: AST, ALT, ALKPHOS, BILITOT, PROT, ALBUMIN,  in the last 168 hours No results found for this basename: LIPASE, AMYLASE,  in the last 168 hours No results found for this basename: AMMONIA,  in the last 168 hours CBC:  Recent Labs Lab 06/13/14 0245  WBC 1.1*  NEUTROABS 0.4*  HGB 11.6*  HCT 32.1*  MCV 77.7*  PLT 110*   Cardiac Enzymes: No results found for this basename: CKTOTAL, CKMB, CKMBINDEX, TROPONINI,  in the last 168 hours BNP (last 3 results) No results found for this basename: PROBNP,  in the last 8760 hours CBG: No results found for this basename: GLUCAP,  in the last 168 hours  No results found for this or any previous visit (from the past 240 hour(s)).      Studies: Dg Chest Port 1 View  06/13/2014   CLINICAL DATA:  Atrial fibrillation. Neutropenia. Fever. Metastatic colon cancer.  EXAM: PORTABLE CHEST - 1 VIEW  COMPARISON:  Chest CT dated 08/24/2013.  FINDINGS: Increased size of a right superior hilar mass and patchy and linear density extending into the adjacent right upper lobe. Calcified left hilar lymph nodes are again demonstrated. Normal sized heart. Clear left lung. Left subclavian port catheter tip in the proximal superior vena cava. Unremarkable bones.  IMPRESSION: Increased right superior hilar and right upper lobe density, suspicious for a primary lung carcinoma with postobstructive changes. This could be best evaluated with a chest CT with contrast.   Electronically Signed   By: Enrique Sack M.D.   On: 06/13/2014 09:34         Scheduled Meds: . dronabinol  5 mg Oral BID AC  . enoxaparin (LOVENOX) injection  40 mg Subcutaneous Q24H  . imipenem-cilastatin  500 mg Intravenous 4 times per day  . sodium chloride  3 mL Intravenous Q12H  . [START ON 06/14/2014] Tbo-filgastrim (GRANIX) SQ  480 mcg Subcutaneous q1800   Continuous Infusions: . sodium chloride 150 mL/hr at 06/13/14 0900    Principal Problem:   Neutropenic fever Active Problems:   Liver metastasis   Colorectal cancer, stage IV   Sepsis   Pancytopenia   Hyponatremia    Time spent: 47 minutes    Maikayla Beggs, MD, FACP, FHM. [facility] Pager [phone]  If 7PM-7AM, please contact night-coverage [email or web address] Password [email or web address] 06/13/2014, 1:40 PM    LOS: 0 days

## 2014-06-13 NOTE — ED Provider Notes (Signed)
CSN: [id number]     Arrival date & time 06/13/14  0218 History   First MD Initiated Contact with Patient 06/13/14 0344     Chief Complaint  Patient presents with  . Neutropenic Fever      (Consider location/radiation/quality/duration/timing/severity/associated sxs/prior Treatment) HPI This is a 63 year old male with advanced rectal cancer. He has had increased liquid out of his colostomy for several days. He became very weak and lethargic this weekend. His son took him to an outlying hospital where he was found to be reportedly neutropenic with fever of 101.3. Blood cultures were obtained and he was given Primaxin IV 500mg  at 5:44PM yesterday. He received 2-3 L of IV fluids and was transferred here for admission by the hospitalist service at the request of Dr. Marin Olp of Oncology (the patient's oncologist is Dr. Alen Blew). He complains of still feeling weak and nauseated with occasional vomiting. He states it has not vomited frequently because he has had little oral intake. He is complaining of ear drops which she's had for about 3 weeks. He denies chest pain, abdominal pain or shortness of breath.  He is requesting admission to Hospice as he longer feels like battling cancer. Blood cultures grew out gram(-) rods, 3 out of four cultures, just reported (0500 today) by the outlying hospital.  Past Medical History  Diagnosis Date  . Liver metastasis 05/07/2011    solitary 1.5 cm right liver metastasis  . Colon cancer   . Malignant neoplasm of rectum    Past Surgical History  Procedure Laterality Date  . Colon surgery  January 2011    low anterior resection  . Adrenalectomy  01/10/2011    Pathology revealed metastatic adrenocarcinoma  . Vasectomy reversal  1980   No family history on file. History  Substance Use Topics  . Smoking status: Former Smoker -- 1.00 packs/day    Types: Cigarettes    Quit date: 05/22/1999  . Smokeless tobacco: Never Used  . Alcohol Use: Yes    Review of  Systems  All other systems reviewed and are negative.   Allergies  Oysters  Home Medications   Prior to Admission medications   Medication Sig Start Date End Date Taking? Authorizing Provider  Alum & Mag Hydroxide-Simeth (MAGIC MOUTHWASH W/LIDOCAINE) SOLN Take 5 mLs by mouth as needed for mouth pain.   Yes Historical Provider, MD  dronabinol (MARINOL) 2.5 MG capsule Take 2.5 mg by mouth daily as needed.   Yes Historical Provider, MD  sildenafil (VIAGRA) 50 MG tablet Take 1 tablet (50 mg total) by mouth daily as needed for erectile dysfunction. 03/18/14  Yes Wyatt Portela, MD   BP 117/69  Pulse 113  Temp(Src) 98.1 F (36.7 C) (Oral)  Resp 20  SpO2 98%  Physical Exam General: Well-developed, well-nourished male in no acute distress; appearance consistent with age of record HENT: normocephalic; atraumatic Eyes: pupils equal, round and reactive to light; extraocular muscles intact Neck: supple Heart: regular rate and rhythm; tachycardia Lungs: clear to auscultation bilaterally; hiccups Abdomen: soft; nondistended; nontender; colostomy draining dark green liquid stool; bowel sounds present Extremities: No deformity; full range of motion; pulses normal Neurologic: Awake, alert and oriented; motor function intact in all extremities and symmetric; no facial droop Skin: Warm and dry Psychiatric: Flat affect    ED Course  Procedures (including critical care time)  MDM   Nursing notes and vitals signs, including pulse oximetry, reviewed.  Summary of this visit's results, reviewed by myself:  Labs:  Results for orders  placed during the hospital encounter of 06/13/14 (from the past 24 hour(s))  CBC WITH DIFFERENTIAL     Status: Abnormal   Collection Time    06/13/14  2:45 AM      Result Value Ref Range   WBC 1.1 (*) 4.0 - 10.5 K/uL   RBC 4.13 (*) 4.22 - 5.81 MIL/uL   Hemoglobin 11.6 (*) 13.0 - 17.0 g/dL   HCT 32.1 (*) 39.0 - 52.0 %   MCV 77.7 (*) 78.0 - 100.0 fL   MCH 28.1   26.0 - 34.0 pg   MCHC 36.1 (*) 30.0 - 36.0 g/dL   RDW 12.6  11.5 - 15.5 %   Platelets 110 (*) 150 - 400 K/uL   Neutrophils Relative % 34 (*) 43 - 77 %   Neutro Abs 0.4 (*) 1.7 - 7.7 K/uL   Lymphocytes Relative 38  12 - 46 %   Lymphs Abs 0.4 (*) 0.7 - 4.0 K/uL   Monocytes Relative 25 (*) 3 - 12 %   Monocytes Absolute 0.3  0.1 - 1.0 K/uL   Eosinophils Relative 4  0 - 5 %   Eosinophils Absolute 0.0  0.0 - 0.7 K/uL   Basophils Relative 0  0 - 1 %   Basophils Absolute 0.0  0.0 - 0.1 K/uL  BASIC METABOLIC PANEL     Status: Abnormal   Collection Time    06/13/14  2:45 AM      Result Value Ref Range   Sodium 132 (*) 137 - 147 mEq/L   Potassium 4.4  3.7 - 5.3 mEq/L   Chloride 96  96 - 112 mEq/L   CO2 21  19 - 32 mEq/L   Glucose, Bld 130 (*) 70 - 99 mg/dL   BUN 18  6 - 23 mg/dL   Creatinine, Ser 0.81  0.50 - 1.35 mg/dL   Calcium 8.9  8.4 - 10.5 mg/dL   GFR calc non Af Amer >90  >90 mL/min   GFR calc Af Amer >90  >90 mL/min   Anion gap 15  5 - 15       Glora Hulgan L Korinne Greenstein, MD 06/13/14 7517

## 2014-06-13 NOTE — Progress Notes (Signed)
Pts wife informed that pt wants to be moved to hospice as soon as he is able to go

## 2014-06-13 NOTE — Consult Note (Signed)
NAMEGEOVANNY, Christopher Jimenez NO.:  [id number]  MEDICAL RECORD NO.:  [id number]  LOCATION:  0865                         FACILITY:  [facility]  PHYSICIAN:  Volanda Napoleon, M.D.  DATE OF BIRTH:  [birth year]  DATE OF CONSULTATION:  06/13/2014 DATE OF DISCHARGE:                                CONSULTATION   REFERRING PHYSICIAN:  Vernell Leep, MD.  REASON FOR CONSULTATION: 1. Febrile neutropenia. 2. Metastatic colon cancer. 3. Gram-negative rod bacteremia.  HISTORY OF PRESENT ILLNESS:  Christopher Jimenez is a very nice 63 year old [place] gentleman.  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  He is followed Dr. Raynald Kemp.  He has metastatic colon cancer.  He was just started on treatment with CPT-11.  He also received Vectibix.  He got this on September 9th.  He lives was mostly at [facility].  He was not doing too well down there. He was not eating all that well.  He was having a lot of output through his colostomy.  His son came to see him.  He found that he was not doing well.  He was taken to a local hospital.  Local hospital stabilized him.  They gave him some on some IV antibiotics, I think with Primaxin.  Cultures were taken.  He was found to have gram-negative rods in his blood.  He is also found to be neutropenic.  His white cell count was 1.1.  His ANC was 400. He was admitted.  He now has rapid atrial fibrillation.  His heart rate is about 160. Apparently, he looks pretty good.  He is alert.  He is not having any problems with pain.  He has had no shortness of breath.  There is no chest wall pain.  He has had no urinary issues.  PAST HISTORY:  Unremarkable.  ALLERGIES:  TO SHELLFISH.  ADMISSION MEDICATIONS:  Marinol 2.5 mg p.o. daily, Viagra 50 mg p.o. as needed, and magic mouthwash as needed.  SOCIAL HISTORY:  Negative for tobacco use.  There is some social alcohol use.  PHYSICAL EXAMINATION:  GENERAL:  This is a well-developed, well- nourished white gentleman in no  obvious distress.  VITAL SIGNS:  Show temperature of 97.9, pulse 160, blood pressure 100/70, oxygen saturation on room air is 96%.  HEAD AND NECK:  Shows no ocular or oral lesions. He has no palpable cervical or supraclavicular lymph nodes.  LUNGS: Clear.  CARDIAC:  Rapid and irregular.  He has no murmurs, rubs, or bruits.  ABDOMEN:  Soft.  He has good bowel sounds.  There is no fluid wave.  There is no palpable liver or spleen tip.  He has a colostomy. He has liquid stool in the colostomy bag.  EXTREMITIES:  Show no clubbing, cyanosis, or edema.  SKIN:  No rashes.  His muscle strength is 4+/5 bilaterally.  NEUROLOGICAL:  Shows no focal neurological deficits.  LABORATORY STUDIES:  White cell count 1.1, hemoglobin 11.6, hematocrit 32.1, platelet count 110.  BUN 18, creatinine 0.8, potassium 4.4 with a sodium 132.  IMPRESSION:  Christopher Jimenez is a very nice 63 year old gentleman.  He has metastatic colon cancer.  He got his first cycle of chemotherapy  with CPT-11 and Vectibix on the 9th.  He clearly has had a tough time with this dosage.  He will definitely need to have dosage reduction if he gets another round of chemotherapy.  He has gram-negative rods in his blood.  He is on IV antibiotics.  He is on Primaxin.  We will wait the results of his blood cultures.  I would also check his stool through the colostomy for C. difficile.  He clearly needs Neulasta and Neupogen.  He currently is on that.  I am sure Cardiology will evaluate him with respect to his atrial fibrillation.  If he needs to be on anticoagulation, I do not see a problem with this.  I probably would put him on Lovenox.  I very much appreciate the outstanding care that he is getting from the staff up on [place].  We will follow along very closely.     Volanda Napoleon, M.D.     PRE/MEDQ  D:  06/13/2014  T:  06/13/2014  Job:  [id number]

## 2014-06-13 NOTE — Progress Notes (Signed)
Pt noted to have HR 140-150 sustained and jumping to 170s at times, Dr Marin Olp in room assessing and Dr Algis Liming paged, EKG ordered.  Pt states he can notice some shortness of breath at times. BP 100/70

## 2014-06-13 NOTE — Progress Notes (Signed)
  Echocardiogram 2D Echocardiogram has been performed.  Christopher Jimenez 06/13/2014, 10:33 AM

## 2014-06-13 NOTE — H&P (Signed)
[facility] Admission History and Physical       Hardeep Reetz HYI:[id number] DOB: 29-Dec-1950 DOA: 06/13/2014  Referring physician: EDP PCP:  Melinda Crutch, MD  Specialists:   Chief Complaint: Fevers and Chills  HPI: Christopher Jimenez is a 63 y.o. male with a history of Metastatic Colorectal Cancer whose last Chemotherapy treatment was on 06/02/2014 who presented to the ED at [facility] in [place] due to Fevers to 101.3 and chills and malaise, with increased loose stool per his colostomy.  He was evaluated there and found to have neutropenia and pancytopneia, and blood cultures were performed and he was placed on IV Primaxin and transferred to [facility] ED after arrangements were made by Dr. Marin Olp of Oncology.   He arrived in the ED and shortly thereafter the [facility] ED called and reported that 3/5 blood cultures had returned positive for Gram Negative Rods.     On arrival to the [facility] ED, labs were repeated anf his WBC s were 1.1 with an absolute Neutrophil count of 0.4.   He was afebrile and hemodynamically stable and placed on Neutropenic precautions and administered his next dose of Primaxin.     Review of Systems:  Unable to Obtain from the Patient   Past Medical History  Diagnosis Date  . Liver metastasis 05/07/2011    solitary 1.5 cm right liver metastasis  . Colon cancer   . Malignant neoplasm of rectum      Past Surgical History  Procedure Laterality Date  . Colon surgery  January 2011    low anterior resection  . Adrenalectomy  01/10/2011    Pathology revealed metastatic adrenocarcinoma  . Vasectomy reversal  1980       Prior to Admission medications   Medication Sig Start Date End Date Taking? Authorizing Provider  Alum & Mag Hydroxide-Simeth (MAGIC MOUTHWASH W/LIDOCAINE) SOLN Take 5 mLs by mouth as needed for mouth pain.   Yes Historical Provider, MD  dronabinol (MARINOL) 2.5 MG capsule Take 2.5 mg by mouth daily as needed.   Yes Historical Provider, MD   sildenafil (VIAGRA) 50 MG tablet Take 1 tablet (50 mg total) by mouth daily as needed for erectile dysfunction. 03/18/14  Yes Wyatt Portela, MD      Allergies  Allergen Reactions  . Oysters [Shellfish Allergy] Other (See Comments)    Passed out     Social History:  reports that he quit smoking about 15 years ago. His smoking use included Cigarettes. He smoked 1.00 pack per day. He has never used smokeless tobacco. He reports that he drinks alcohol. He reports that he does not use illicit drugs.     No family history on file.     Physical Exam:  GEN:  Pleasant Drowsy thin toxic appearing 63 y.o. Caucasian male examined  and in no acute distress; cooperative with exam Filed Vitals:   06/13/14 0221 06/13/14 0229 06/13/14 0550  BP:  117/69 116/72  Pulse:  113 90  Temp:  98.1 F (36.7 C) 97.9 F (36.6 C)  TempSrc:  Oral Oral  Resp:  20 18  SpO2: 98% 98% 96%   Blood pressure 116/72, pulse 90, temperature 97.9 F (36.6 C), temperature source Oral, resp. rate 18, SpO2 96.00%. PSYCH: He is alert and oriented x4; does not appear anxious does not appear depressed; affect is normal HEENT: Normocephalic and Atraumatic, Mucous membranes pink; PERRLA; EOM intact; Fundi:  Benign;  No scleral icterus, Nares: Patent, Oropharynx: Clear, Fair Dentition,  Neck:  FROM, No Cervical Lymphadenopathy nor Thyromegaly or Carotid Bruit; No JVD; Breasts:: Not examined CHEST WALL: No tenderness CHEST: Normal respiration, clear to auscultation bilaterally HEART: Regular rate and rhythm; no murmurs rubs or gallops BACK: No kyphosis or scoliosis; No CVA tenderness ABDOMEN: Positive Bowel Sounds, Scaphoid, +colostomy present ,   Soft, Mildly Tender  LLQ ; No Masses, No Organomegaly Rectal Exam: Not done EXTREMITIES: No Cyanosis, Clubbing, or Edema; No Ulcerations. Genitalia: not examined PULSES: 2+ and symmetric SKIN: Normal hydration no rash or ulceration CNS:  Drowsy,  A x O x 4, No Focal Deficits   Vascular: pulses palpable throughout    Labs on Admission:  Basic Metabolic Panel:  Recent Labs Lab 06/13/14 0245  NA 132*  K 4.4  CL 96  CO2 21  GLUCOSE 130*  BUN 18  CREATININE 0.81  CALCIUM 8.9   Liver Function Tests: No results found for this basename: AST, ALT, ALKPHOS, BILITOT, PROT, ALBUMIN,  in the last 168 hours No results found for this basename: LIPASE, AMYLASE,  in the last 168 hours No results found for this basename: AMMONIA,  in the last 168 hours CBC:  Recent Labs Lab 06/13/14 0245  WBC 1.1*  NEUTROABS 0.4*  HGB 11.6*  HCT 32.1*  MCV 77.7*  PLT 110*   Cardiac Enzymes: No results found for this basename: CKTOTAL, CKMB, CKMBINDEX, TROPONINI,  in the last 168 hours  BNP (last 3 results) No results found for this basename: PROBNP,  in the last 8760 hours CBG: No results found for this basename: GLUCAP,  in the last 168 hours  Radiological Exams on Admission: No results found.    Assessment/Plan:   63 y.o. male with  Principal Problem:   1.    Neutropenic fever/ and Sepsis   +Blood Culture for GNRs   IV Primaxin    IVFs   Neutropenic Precautions    2.    Pancytopenia- caused by Chemotherapy   Monitor      3.    Hyponatremia   IVFs with NSS     4.    Liver metastasis/ Colorectal cancer, stage IV- On Chemotherapy   Oncology: Dr Marin Olp to see       6.     DVT Prophylaxis   Lovenox    Code Status:   DO NOT RESUSCITATE (DNR)    Family Communication:  Son at Bedside   Disposition Plan:   Inpatient       Time spent:  [facility] Pager [phone]   If [place] Please Contact the Day Rounding Team MD for [facility]  If 7PM-7AM, Please Contact night-coverage  [email or web address] Password [email or web address] 06/13/2014, 6:19 AM

## 2014-06-13 NOTE — ED Notes (Addendum)
Per [facility], pt. Transferred from  [facility] , pt. Has metastatic  Colon cancer and lately experiencing  the following problems;  Neutropenic fever , dehydration , severe loose stools via colostomy and generalized weakness. Pt. Was on vacation at [facility] when got sick and seen at  [place] and oriented x3. Denies SOB nor chest pain , claimed of nausea , no vomiting reported.

## 2014-06-13 NOTE — Progress Notes (Signed)
Pt noted to be in NSR, rate of 105

## 2014-06-13 NOTE — Progress Notes (Signed)
Pharmacy is asked to dose G-CSF  Assessment: Patient admitted with fever and GNR in 3 of 4 blood cultures from an outside hospital. He received irinotecan and panitumumab on 06/02/14. WBCs 1.1 and ANC 0.4 on admission.  Weight 78kg.  Plan:  Start tbo-filgrastim 442mcg SQ daily.  Check CBC w/ diff daily.  Continue until post-nadir ANC > 1,000.  Romeo Rabon, PharmD, pager [phone]. 06/13/2014,7:51 AM.

## 2014-06-13 NOTE — ED Notes (Signed)
Per [employer], patient with 3 of 4 positive blood cultures. Patient is positive for gram negative rods. MD notified.

## 2014-06-13 NOTE — Consult Note (Signed)
#   [id number] is the consult note.  Clearly, she has had a severe reaction to the chemotherapy with CPT-11/Vectibix  He would definitely need a dose reduction with his next cycle. Marland Kitchen  He has gram-negative rods in his blood. He is in rapid atrial fibrillation.  He is on Neupogen. He is on Primaxin. We will have to see what the  Gram-negative rods are.  He's having a lot of colostomy output. I'll probably would check for C. difficile.  He might need anticoagulation for the atrial fibrillation. If so, I probably would consider Lovenox injections. I realize that they are injections. However with his malignancy, this probably would be safest and that we have the most data been using.  We will certainly follow along closely. I very much appreciate the outstanding care that he is getting on 4 E.!!!  Pete E.  [place] 4:13

## 2014-06-13 NOTE — Progress Notes (Signed)
Addendum  Revisited patient. He states that he feels much better. Gives history of intermittent hiccups ongoing for the last 3 months-seemed to be precipitated by cold liquids. Discussed ongoing care in detail. Denies palpitations, chest pain or dyspnea. Telemetry shows A. fib in the 120s-much improved compared to 160s earlier this morning. Mucosa more moist. As per nursing, short while later, patient reverted to sinus tachycardia in the 100-105 per minute. 2-D echo shows normal EF. Patient states that he is interested in pursuing hospice-advised him that we will address this with the oncologist in a.m.  Vernell Leep, MD, FACP, FHM. [facility] Pager [phone]  If 7PM-7AM, please contact night-coverage [email or web address] Password [facility] 06/13/2014, 5:33 PM

## 2014-06-13 NOTE — ED Notes (Signed)
Bed: WA09 Expected date:  Expected time:  Means of arrival:  Comments: Transfer from [employer] [facility] [employer]

## 2014-06-13 NOTE — Progress Notes (Signed)
Pts heart rate ranging from 115-130s since IVF bolus, mostly sustaining in 120s, pt asymptomatic.

## 2014-06-13 NOTE — ED Notes (Signed)
Per charge nurse, Almyra Free, at [facility] patient received 500mg  Primaxin IV infused over 1 hour. MD notified

## 2014-06-13 NOTE — Progress Notes (Addendum)
ANTIBIOTIC CONSULT NOTE - INITIAL  Pharmacy Consult for Primaxin Indication: Neutropenic fever  Allergies  Allergen Reactions  . Oysters [Shellfish Allergy] Other (See Comments)    Passed out    Patient Measurements: Height: 6\' 1"  (185.4 cm) Weight: 181 lb (82.101 kg) IBW/kg (Calculated) : 79.9 Adjusted Body Weight:   Vital Signs: Temp: 97.9 F (36.6 C) (09/20 0550) Temp src: Oral (09/20 0550) BP: 116/72 mmHg (09/20 0550) Pulse Rate: 90 (09/20 0550) Intake/Output from previous day: 09/19 0701 - 09/20 0700 In: 200 [I.V.:200] Out: 500 [Urine:500] Intake/Output from this shift: Total I/O In: 200 [I.V.:200] Out: 500 [Urine:500]  Labs:  Recent Labs  06/13/14 0245  WBC 1.1*  HGB 11.6*  PLT 110*  CREATININE 0.81   Estimated Creatinine Clearance: 104.1 ml/min (by C-G formula based on Cr of 0.81). No results found for this basename: VANCOTROUGH, VANCOPEAK, VANCORANDOM, GENTTROUGH, GENTPEAK, GENTRANDOM, TOBRATROUGH, TOBRAPEAK, TOBRARND, AMIKACINPEAK, AMIKACINTROU, AMIKACIN,  in the last 72 hours   Microbiology: No results found for this or any previous visit (from the past 720 hour(s)).  Medical History: Past Medical History  Diagnosis Date  . Liver metastasis 05/07/2011    solitary 1.5 cm right liver metastasis  . Colon cancer   . Malignant neoplasm of rectum     Medications:  Anti-infectives   Start     Dose/Rate Route Frequency Ordered Stop   06/13/14 1400  imipenem-cilastatin (PRIMAXIN) 500 mg in sodium chloride 0.9 % 100 mL IVPB     500 mg 200 mL/hr over 30 Minutes Intravenous 3 times per day 06/13/14 0623     06/13/14 0515  imipenem-cilastatin (PRIMAXIN) 500 mg in sodium chloride 0.9 % 100 mL IVPB     500 mg 200 mL/hr over 30 Minutes Intravenous  Once 06/13/14 0511 06/13/14 1610     Assessment: Patient with neutropenic fever. First dose of antibiotics already given in ED.  Goal of Therapy:  Primaxin dosed based on patient weight and renal function    Plan:  Follow up culture results Primaxin 500mg  iv q6hr  Tyler Deis, Shea Stakes [place] 06/13/2014,6:24 AM

## 2014-06-14 DIAGNOSIS — E43 Unspecified severe protein-calorie malnutrition: Secondary | ICD-10-CM | POA: Insufficient documentation

## 2014-06-14 LAB — COMPREHENSIVE METABOLIC PANEL
ALT: 104 U/L — ABNORMAL HIGH (ref 0–53)
AST: 134 U/L — ABNORMAL HIGH (ref 0–37)
Albumin: 1.8 g/dL — ABNORMAL LOW (ref 3.5–5.2)
Alkaline Phosphatase: 75 U/L (ref 39–117)
Anion gap: 11 (ref 5–15)
BUN: 11 mg/dL (ref 6–23)
CALCIUM: 8.1 mg/dL — AB (ref 8.4–10.5)
CHLORIDE: 102 meq/L (ref 96–112)
CO2: 22 meq/L (ref 19–32)
CREATININE: 0.64 mg/dL (ref 0.50–1.35)
GFR calc Af Amer: >90 mL/min (ref >90)
Glucose, Bld: 117 mg/dL — ABNORMAL HIGH (ref 70–99)
Potassium: 3.1 mEq/L — ABNORMAL LOW (ref 3.7–5.3)
Sodium: 135 mEq/L — ABNORMAL LOW (ref 137–147)
Total Bilirubin: 0.6 mg/dL (ref 0.3–1.2)
Total Protein: 5.3 g/dL — ABNORMAL LOW (ref 6.0–8.3)

## 2014-06-14 LAB — GI PATHOGEN PANEL BY PCR, STOOL
C DIFFICILE TOXIN A/B: NEGATIVE
CAMPYLOBACTER BY PCR: NEGATIVE
CRYPTOSPORIDIUM BY PCR: NEGATIVE
E COLI (ETEC) LT/ST: NEGATIVE
E coli (STEC): NEGATIVE
E coli 0157 by PCR: NEGATIVE
G lamblia by PCR: NEGATIVE
Norovirus GI/GII: NEGATIVE
Rotavirus A by PCR: NEGATIVE
Salmonella by PCR: NEGATIVE
Shigella by PCR: NEGATIVE

## 2014-06-14 LAB — URINE CULTURE
Colony Count: NO GROWTH
Culture: NO GROWTH

## 2014-06-14 LAB — PREALBUMIN: PREALBUMIN: 3.4 mg/dL — AB (ref 17.0–34.0)

## 2014-06-14 LAB — CBC WITH DIFFERENTIAL/PLATELET
Basophils Absolute: 0 10*3/uL (ref 0.0–0.1)
Basophils Relative: 0 % (ref 0–1)
EOS PCT: 2 % (ref 0–5)
Eosinophils Absolute: 0.1 10*3/uL (ref 0.0–0.7)
HCT: 26 % — ABNORMAL LOW (ref 39.0–52.0)
HEMOGLOBIN: 9.2 g/dL — AB (ref 13.0–17.0)
LYMPHS ABS: 0.6 10*3/uL — AB (ref 0.7–4.0)
LYMPHS PCT: 13 % (ref 12–46)
MCH: 28.2 pg (ref 26.0–34.0)
MCHC: 35.4 g/dL (ref 30.0–36.0)
MCV: 79.8 fL (ref 78.0–100.0)
MONO ABS: 0.2 10*3/uL (ref 0.1–1.0)
Monocytes Relative: 4 % (ref 3–12)
NEUTROS ABS: 3.8 10*3/uL (ref 1.7–7.7)
NEUTROS PCT: 81 % — AB (ref 43–77)
Platelets: 117 10*3/uL — ABNORMAL LOW (ref 150–400)
RBC: 3.26 MIL/uL — ABNORMAL LOW (ref 4.22–5.81)
RDW: 13 % (ref 11.5–15.5)
WBC: 4.7 10*3/uL (ref 4.0–10.5)

## 2014-06-14 MED ORDER — TBO-FILGRASTIM 300 MCG/0.5ML ~~LOC~~ SOSY
300.0000 ug | PREFILLED_SYRINGE | Freq: Every day | SUBCUTANEOUS | Status: DC
  Administered 2014-06-14: 300 ug via SUBCUTANEOUS
  Filled 2014-06-14: qty 0.5

## 2014-06-14 MED ORDER — POTASSIUM CHLORIDE CRYS ER 20 MEQ PO TBCR
40.0000 meq | EXTENDED_RELEASE_TABLET | Freq: Once | ORAL | Status: AC
  Administered 2014-06-14: 40 meq via ORAL
  Filled 2014-06-14: qty 2

## 2014-06-14 MED ORDER — POTASSIUM CHLORIDE IN NACL 40-0.9 MEQ/L-% IV SOLN
INTRAVENOUS | Status: AC
  Administered 2014-06-14 – 2014-06-15 (×2): 100 mL/h via INTRAVENOUS
  Filled 2014-06-14 (×3): qty 1000

## 2014-06-14 NOTE — Progress Notes (Signed)
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 He is still on antibiotics. I still not seen what the identification is of the gram-negative rods in his blood.  His white cell counts come up quite nicely. We will go ahead and stop the Neupogen.  He still has a lot of liquid stool in the colostomy bag. His C. Difficile titer was negative.  Of most importance is the fact that he does not want any more treatment. He wants quality of life. He wants to hospice.  We will get palliative care to see him today. I think this would be a very good idea for him. His wife was with [place] at this morning.  His appetite is better. We will go ahead and improve and increase his diet.  He's had no fever. His blood pressure is stable at 107/70.his physical exam shows no oral lesions. He has no adenopathy in his neck. Lungs are pretty clear. Cardiac exam regular in rhythm. Abdomen is soft. He has a colostomy that is full of liquid stool. There is no guarding or rebound tenderness. He has no palpable mass. Is no palpable liver or spleen. Extremities shows no clubbing, cyanosis or edema. Neurological exam shows no focal neurological deficits.  We had a very long talk about his prognosis. He had a chest x-ray done yesterday. It looks like he does have a right hilar mass. He has a right upper lobe lesion. On his last scans which were done a couple months ago, he had pulmonary metastasis.  I am sending off a prealbumin on him. I would think that this will be quite low. His albumin is only 1.8.  His SGPT and SGOT are both elevated. His might be from his medications.  He is a no CODE BLUE. I agree with this.  By the hospice can help him out. He lives mostly at the [place] can work with one of a local hospices down in [place] to help him out.  I totally agree with him wine quality of life. He really had a hard time with chemotherapy.  His wife wants to know how I thought he had. I would think that no more than 3 or 4 months  would be reasonable. We will see what his prealbumin is.  I spent about 40 minutes with them today. I answered all their questions. We will get hospice arranged.  I think that we should increase his diet.  I will make sure that he is off any thing that is causing diarrhea.  [place] 55:22

## 2014-06-14 NOTE — Progress Notes (Signed)
I have met with Mr. Degroat and he agrees to meeting to discuss hospice options tomorrow 9/22 1000 am with palliative care. Thank you for this consult.   Vinie Sill, NP Palliative Medicine Team Pager # [phone] (M-F 8a-5p) Team Phone # [phone] (Nights/Weekends)

## 2014-06-14 NOTE — Progress Notes (Signed)
PT declined chaplain visit, expressed appreciation for services offered by hospital.  Vanetta Mulders 06/14/2014

## 2014-06-14 NOTE — Care Management Note (Addendum)
    Page 1 of 2   06/16/2014     1:21:46 PM CARE MANAGEMENT NOTE 06/16/2014  Patient:  Christopher Jimenez, Christopher Jimenez   Account Number:  [id number]  Date Initiated:  06/14/2014  Documentation initiated by:  Dessa Phi  Subjective/Objective Assessment:   63 Y/O M ADMITTED W/NEUTROPENIC FEVER.HX:MET COLON CA,COLOSTOMY,DNR.     Action/Plan:   FROM HOME.   Anticipated DC Date:  06/16/2014   Anticipated DC Plan:  [place]  CM consult      Choice offered to / List presented to:  C-1 Patient   DME arranged  Vassie Moselle      DME agency  Ord arranged  HH-1 RN      [place]   Status of service:  Completed, signed off Medicare Important Message given?   (If response is "NO", the following Medicare IM given date fields will be blank) Date Medicare IM given:   Medicare IM given by:   Date Additional Medicare IM given:   Additional Medicare IM given by:    Discharge Disposition:  [place]  Per UR Regulation:  Reviewed for med. necessity/level of care/duration of stay  If discussed at [place] of Stay Meetings, dates discussed:    Comments:  06/16/14 Peaches Vanoverbeke RN,BSN NCM [phone] 1:20P-RECEIVED CALL ABOUT RW.TC [facility] REP LEFT VM ABOUT NEED FOR RW TO BE BROUGHT TO RM.  CM/SW-CLARIFIED D/C PLANS W/PATIENT.PATIENT PLANS TO D/C HOME W/[facility]-TC SPOKE TO DIANA REP [facility] THEY CAN ACCEPT PATIENT, HOME VISIT TOMORROW PER PATIENT REQUEST.DME-RW-[facility] REP LEFT VM ABOUT D/C & RW TO BE BROUGHT TO RM.PATIENT'S HTDSKAJ:[id number] [place] [id number].NO FURTHER D/C NEEDS.  06/15/14 Chandrika Sandles RN,BSN NCM [phone] CM/SW WENT TO DISCUSS D/C PLANS W/PATIENT.PATIENT CHOSE HOME W/HOSPICE-[facility] CHOSEN-[facility] [place] [facility] TEL#[phone].THEY WILL SEE PATIENT IN AM.DR. SHADADD WILL FOLLOW.DME-RW NEEDED.[place].WILL LIVE IN [place] [address]).  06/14/14 Markian Glockner RN,BSN NCM [phone] PALLIATIVE CONS.AWAIT RECOMMENDATIONS.

## 2014-06-14 NOTE — Progress Notes (Signed)
INITIAL NUTRITION ASSESSMENT  DOCUMENTATION CODES Per approved criteria  -Severe malnutrition in the context of chronic illness  Pt meets criteria for severe MALNUTRITION in the context of chronic illness as evidenced by 10% body weight loss in < one month, PO intake < 75% for one month.   INTERVENTION: -Trial Ensure and Boost supplement with diet advancement -Diet advancement per MD -RD to continue to follow  NUTRITION DIAGNOSIS: Inadequate oral intake related to pain/early satiety/decreased appetite as evidenced by PO intake< 75%, wt loss.   Goal: Pt to meet >/= 90% of their estimated nutrition needs    Monitor:  Diet order, total protein/energy intake, labs, weight, supplement tolerance  Reason for Assessment: MST  63 y.o. male  Admitting Dx: Neutropenic fever  ASSESSMENT: Christopher Jimenez is a 63 y.o. male with a history of Metastatic Colorectal Cancer whose last Chemotherapy treatment was on 06/02/2014 who presented to the ED at [facility] in [place] due to Fevers to 101.3 and chills and malaise, with increased loose stool per his colostomy  -Pt reported decreased PO intake for past 3 weeks. Diet has consisted largely of liquids- soups and juices. Denied use of nutrition supplements. Has had episode of pain, early satiety and loose stools that have likely contributed to decreased intake -Endorsed an unintentional wt loss of 10 lbs in < one month. Usual body weight around 190 lbs -Pt on clear liquid diet; however was consuming fruits and breakfast sandwich during time of RD assessment. Consumed > 50%. Reported improvement in appetite with solid foods. Informed NT.  -Pt willing to trial nutrition supplement to assist with nutrient replenishment. Will order Boost and Ensure once daily with diet advancement.   Height: Ht Readings from Last 1 Encounters:  06/13/14 6' (1.829 m)    Weight: Wt Readings from Last 1 Encounters:  06/13/14 172 lb 2.9 oz (78.1 kg)    Ideal Body  Weight: 184 lb  % Ideal Body Weight: 93%  Wt Readings from Last 10 Encounters:  06/13/14 172 lb 2.9 oz (78.1 kg)  06/02/14 181 lb 11.2 oz (82.419 kg)  04/22/14 188 lb (85.276 kg)  03/18/14 188 lb 4.8 oz (85.412 kg)  01/28/14 195 lb 3.2 oz (88.542 kg)  12/31/13 192 lb 14.4 oz (87.499 kg)  12/03/13 192 lb 12.8 oz (87.454 kg)  10/22/13 189 lb 4.8 oz (85.866 kg)  09/14/13 186 lb 1.6 oz (84.414 kg)  08/25/13 182 lb 6.4 oz (82.736 kg)    Usual Body Weight: 191 lbs  % Usual Body Weight: 90%  BMI:  Body mass index is 23.35 kg/(m^2).  Estimated Nutritional Needs: Kcal: 9485-4627 Protein: 110-120  Fluid: >/=2300 ml/daily  Skin: WDL  Diet Order: Clear Liquid  EDUCATION NEEDS: -No education needs identified at this time   Intake/Output Summary (Last 24 hours) at 06/14/14 1046 Last data filed at 06/14/14 1000  Gross per 24 hour  Intake   3900 ml  Output   2450 ml  Net   1450 ml    Last BM: 9/21   Labs:   Recent Labs Lab 06/13/14 0245 06/14/14 0417  NA 132* 135*  K 4.4 3.1*  CL 96 102  CO2 21 22  BUN 18 11  CREATININE 0.81 0.64  CALCIUM 8.9 8.1*  GLUCOSE 130* 117*    CBG (last 3)  No results found for this basename: GLUCAP,  in the last 72 hours  Scheduled Meds: . dronabinol  5 mg Oral BID AC  . enoxaparin (LOVENOX) injection  40  mg Subcutaneous Q24H  . imipenem-cilastatin  500 mg Intravenous 4 times per day  . sodium chloride  3 mL Intravenous Q12H  . Tbo-filgastrim (GRANIX) SQ  480 mcg Subcutaneous q1800    Continuous Infusions:   Past Medical History  Diagnosis Date  . Liver metastasis 05/07/2011    solitary 1.5 cm right liver metastasis  . Colon cancer   . Malignant neoplasm of rectum     Past Surgical History  Procedure Laterality Date  . Colon surgery  January 2011    low anterior resection  . Adrenalectomy  01/10/2011    Pathology revealed metastatic adrenocarcinoma  . Vasectomy reversal  [place] [facility]  Dietitian [phone]

## 2014-06-14 NOTE — Progress Notes (Signed)
PT Cancellation Note / Screen  Patient Details Name: Christopher Jimenez MRN: [id number] DOB: 1951/03/30   Cancelled Treatment:    Reason Eval/Treat Not Completed: PT screened, no needs identified, will sign off  Spoke with pt at bedside and he reports he does not wish to start PT at this time, stating he will not tolerate it.  Pt states he will likely d/c tomorrow.  Please re-order if pt changes his mind and/or remains inpatient.  Thanks.   Marce Schartz,KATHrine E 06/14/2014, 9:09 AM Carmelia Bake, PT, DPT 06/14/2014 Pager: [phone]

## 2014-06-14 NOTE — Progress Notes (Signed)
Pharmacy to dose G-CSF  Assessment: Patient admitted with fever and GNR in 3 of 4 blood cultures from an outside hospital. He received irinotecan and panitumumab on 06/02/14. WBCs 1.1 and ANC 0.4 on admission.  Weight 78kg.  Today 9/21: ANC = 3.8 WBC = 4.7  Plan:  Continue tbo-filgrastim at lower dosing of 346mcg SQ daily.  Check CBC w/ diff daily.  Continue until neutrophil count is stable and has recovered to normal range    Kizzie Furnish, PharmD Pager: [phone] 06/14/2014 2:01 PM

## 2014-06-14 NOTE — Progress Notes (Signed)
PROGRESS NOTE    Christopher Jimenez QPY:[id number] DOB: 1951/06/24 DOA: 06/13/2014 PCP:  Melinda Crutch, MD Primary Oncologist: Dr. Zola Button.  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 Evaluation day or revealed neutropenia. Blood cultures were drawn, patient was placed on IV Primaxin, case discussed with oncologist on call and patient was transferred to [facility] long ED. Outside hospital reported 3/5 blood cultures positive for GNR. At [facility] long ED, WBC 1.1 and absolute neutrophil count of 0.4. On the floor, he was noted to have new onset A. fib with RVR.  Assessment/Plan:  1. Severe GNR sepsis, present on admission: Source unclear. Urine culture negative. C. difficile PCR negative. Follow final blood culture results from outside hospital. Continue IV Primaxin and fluid resuscitation. Do not suspect line sepsis or mucositis and hence hold off on vancomycin. Definitely improved compared to 9/20. 2. Pancytopenia/febrile neutropenia: Secondary to recent chemotherapy. Treat sepsis as above. Supportive treatment. Transfuse if hemoglobin less than 7 g per DL. Treated with Granix and WBC improved to 4.7-DC Granix. No reported bleeding. 3. Severe dehydration with mild hyponatremia: Secondary to GI losses, poor oral intake and severe sepsis. Aggressively hydrated with IV normal saline. Improved. Reduce IV fluids. 4. New A. fib with RVR: Likely precipitated by acute illness and sepsis. Check 2-D echo. TSH low normal. Treat dehydration and sepsis aggressively and if persists with RVR, may consider rate control medications. Currently asymptomatic of chest pain, dyspnea or palpitations. CDADS  2 score: 0. May consider aspirin if platelets remain stable. Reverted to sinus rhythm on 9/20 p.m. 5. Stage IV colorectal cancer , s/p adrenal mass resection, s/p liver metastasis ablation and pulmonary metastasis: Management per oncology. Chest x-ray shows increased right superior hilum and right upper lobe density suspicious for primary lung cancer with postobstructive changes-defer further evaluation to oncology. Patient and family interested in not pursuing aggressive treatment for cancer and wish to proceed with hospice. This was discussed by them with oncology who is in agreement. Palliative care team followup pending. Patient however wishes to continue antibiotic treatment. 6. Hypokalemia: Replete and follow   Code Status: DO NOT RESUSCITATE Family Communication: Discussed with patient's son and spouse at bedside Disposition Plan: Home with hospice when stable.   Consultants:  Medical oncology  Palliative care team  Procedures:  None  Antibiotics:  IV Primaxin 9/20 >  Subjective: Feels much better. Stronger. Appetite improved. Intermittent hiccups.  Objective: Filed Vitals:   06/13/14 1627 06/13/14 2114 06/14/14 0429 06/14/14 1408  BP: 102/67 104/62 103/62 107/70  Pulse:  104 90 89  Temp:  98 F (36.7 C) 98.2 F (36.8 C) 98.2 F (36.8 C)  TempSrc:  Oral Oral Oral  Resp:  16 24 22   Height:      Weight:      SpO2:  99% 93% 97%    Intake/Output Summary (Last 24 hours) at 06/14/14 1751 Last data filed at 06/14/14 1701  Gross per 24 hour  Intake   2940 ml  Output   2000 ml  Net    940 ml   Filed Weights   06/13/14 0650  Weight: 78.1 kg (172 lb 2.9 oz)     Exam:  General exam: Middle-aged moderately built and nourished male looks much improved  compared to yesterday. Pleasant and comfortable. Respiratory system: Diminished breath sounds in the bases with occasional basal crackles otherwise the rest of lung fields clear to auscultation. No increased work of  breathing. Cardiovascular system: S1 & S2 heard, RRR. No JVD, murmurs, gallops, clicks or pedal edema. Telemetry: Sinus rhythm Gastrointestinal system: Abdomen is nondistended, soft and nontender. Normal bowel sounds heard. Colostomy draining liquid green stools-decreased amounts. Central nervous system: Alert and oriented x3. No focal neurological deficits. Extremities: Symmetric 5 x 5 power.   Data Reviewed: Basic Metabolic Panel:  Recent Labs Lab 06/13/14 0245 06/14/14 0417  NA 132* 135*  K 4.4 3.1*  CL 96 102  CO2 21 22  GLUCOSE 130* 117*  BUN 18 11  CREATININE 0.81 0.64  CALCIUM 8.9 8.1*   Liver Function Tests:  Recent Labs Lab 06/14/14 0417  AST 134*  ALT 104*  ALKPHOS 75  BILITOT 0.6  PROT 5.3*  ALBUMIN 1.8*   No results found for this basename: LIPASE, AMYLASE,  in the last 168 hours No results found for this basename: AMMONIA,  in the last 168 hours CBC:  Recent Labs Lab 06/13/14 0245 06/14/14 0417  WBC 1.1* 4.7  NEUTROABS 0.4* 3.8  HGB 11.6* 9.2*  HCT 32.1* 26.0*  MCV 77.7* 79.8  PLT 110* 117*   Cardiac Enzymes: No results found for this basename: CKTOTAL, CKMB, CKMBINDEX, TROPONINI,  in the last 168 hours BNP (last 3 results) No results found for this basename: PROBNP,  in the last 8760 hours CBG: No results found for this basename: GLUCAP,  in the last 168 hours  Recent Results (from the past 240 hour(s))  CLOSTRIDIUM DIFFICILE BY PCR     Status: None   Collection Time    06/13/14  9:00 AM      Result Value Ref Range Status   C difficile by pcr NEGATIVE  NEGATIVE Final   Comment: Performed at [facility]     Status: None   Collection Time    06/13/14  2:09 PM      Result Value Ref Range Status   Specimen Description URINE, CLEAN CATCH   Final   Special Requests NONE   Final   Culture  Setup Time     Final   Value: 06/13/2014 19:10     Performed at [facility]     Final   Value: NO  GROWTH     Performed at [facility]   Culture     Final   Value: NO GROWTH     Performed at [facility]   Report Status 06/14/2014 FINAL   Final        Studies: Dg Chest Port 1 View  06/13/2014   CLINICAL DATA:  Atrial fibrillation. Neutropenia. Fever. Metastatic colon cancer.  EXAM: PORTABLE CHEST - 1 VIEW  COMPARISON:  Chest CT dated 08/24/2013.  FINDINGS: Increased size of a right superior hilar mass and patchy and linear density extending into the adjacent right upper lobe. Calcified left hilar lymph nodes are again demonstrated. Normal sized heart. Clear left lung. Left subclavian port catheter tip in the proximal superior vena cava. Unremarkable bones.  IMPRESSION: Increased right superior hilar and right upper lobe density, suspicious for a primary lung carcinoma with postobstructive changes. This could be best evaluated with a chest CT with contrast.   Electronically Signed   By: Enrique Sack M.D.   On: 06/13/2014 09:34  Scheduled Meds: . dronabinol  5 mg Oral BID AC  . enoxaparin (LOVENOX) injection  40 mg Subcutaneous Q24H  . imipenem-cilastatin  500 mg Intravenous 4 times per day  . sodium chloride  3 mL Intravenous Q12H  . Tbo-filgastrim (GRANIX) SQ  300 mcg Subcutaneous q1800   Continuous Infusions:    Principal Problem:   Neutropenic fever Active Problems:   Liver metastasis   Colorectal cancer, stage IV   Sepsis   Pancytopenia   Hyponatremia   Protein-calorie malnutrition, severe    Time spent: 40 minutes    Ramiel Forti, MD, FACP, FHM. [facility] Pager [phone]  If 7PM-7AM, please contact night-coverage [email or web address] Password [email or web address] 06/14/2014, 5:51 PM    LOS: 1 day

## 2014-06-15 DIAGNOSIS — Z515 Encounter for palliative care: Secondary | ICD-10-CM

## 2014-06-15 DIAGNOSIS — R7989 Other specified abnormal findings of blood chemistry: Secondary | ICD-10-CM

## 2014-06-15 DIAGNOSIS — A4151 Sepsis due to Escherichia coli [E. coli]: Principal | ICD-10-CM

## 2014-06-15 LAB — CBC WITH DIFFERENTIAL/PLATELET
BAND NEUTROPHILS: 0 % (ref 0–10)
BLASTS: 0 %
Basophils Absolute: 0 10*3/uL (ref 0.0–0.1)
Basophils Relative: 0 % (ref 0–1)
Eosinophils Absolute: 0.7 10*3/uL (ref 0.0–0.7)
Eosinophils Relative: 4 % (ref 0–5)
HEMATOCRIT: 27.1 % — AB (ref 39.0–52.0)
Hemoglobin: 9.6 g/dL — ABNORMAL LOW (ref 13.0–17.0)
LYMPHS PCT: 14 % (ref 12–46)
Lymphs Abs: 2.5 10*3/uL (ref 0.7–4.0)
MCH: 28.2 pg (ref 26.0–34.0)
MCHC: 35.4 g/dL (ref 30.0–36.0)
MCV: 79.7 fL (ref 78.0–100.0)
MONO ABS: 1.1 10*3/uL — AB (ref 0.1–1.0)
Metamyelocytes Relative: 4 %
Monocytes Relative: 6 % (ref 3–12)
Myelocytes: 4 %
Neutro Abs: 13.2 10*3/uL — ABNORMAL HIGH (ref 1.7–7.7)
Neutrophils Relative %: 67 % (ref 43–77)
PLATELETS: 161 10*3/uL (ref 150–400)
PROMYELOCYTES ABS: 1 %
RBC: 3.4 MIL/uL — ABNORMAL LOW (ref 4.22–5.81)
RDW: 13.2 % (ref 11.5–15.5)
WBC: 17.5 10*3/uL — ABNORMAL HIGH (ref 4.0–10.5)
nRBC: 0 /100 WBC

## 2014-06-15 LAB — COMPREHENSIVE METABOLIC PANEL
ALT: 198 U/L — ABNORMAL HIGH (ref 0–53)
AST: 176 U/L — AB (ref 0–37)
Albumin: 1.8 g/dL — ABNORMAL LOW (ref 3.5–5.2)
Alkaline Phosphatase: 99 U/L (ref 39–117)
Anion gap: 10 (ref 5–15)
BILIRUBIN TOTAL: 0.5 mg/dL (ref 0.3–1.2)
BUN: 10 mg/dL (ref 6–23)
CHLORIDE: 104 meq/L (ref 96–112)
CO2: 23 meq/L (ref 19–32)
CREATININE: 0.65 mg/dL (ref 0.50–1.35)
Calcium: 7.9 mg/dL — ABNORMAL LOW (ref 8.4–10.5)
GLUCOSE: 96 mg/dL (ref 70–99)
Potassium: 3.5 mEq/L — ABNORMAL LOW (ref 3.7–5.3)
Sodium: 137 mEq/L (ref 137–147)
Total Protein: 5.1 g/dL — ABNORMAL LOW (ref 6.0–8.3)

## 2014-06-15 MED ORDER — CIPROFLOXACIN HCL 750 MG PO TABS
750.0000 mg | ORAL_TABLET | Freq: Two times a day (BID) | ORAL | Status: DC
  Administered 2014-06-15 – 2014-06-16 (×2): 750 mg via ORAL
  Filled 2014-06-15 (×2): qty 1

## 2014-06-15 MED ORDER — BOOST PLUS PO LIQD
237.0000 mL | Freq: Once | ORAL | Status: AC
  Administered 2014-06-15: 237 mL via ORAL
  Filled 2014-06-15: qty 237

## 2014-06-15 MED ORDER — ENSURE COMPLETE PO LIQD
237.0000 mL | Freq: Once | ORAL | Status: AC
  Administered 2014-06-15: 237 mL via ORAL

## 2014-06-15 MED ORDER — ENSURE COMPLETE PO LIQD
237.0000 mL | Freq: Two times a day (BID) | ORAL | Status: DC
  Administered 2014-06-16: 237 mL via ORAL

## 2014-06-15 MED ORDER — CHLORPROMAZINE HCL 25 MG PO TABS
25.0000 mg | ORAL_TABLET | Freq: Three times a day (TID) | ORAL | Status: DC | PRN
  Filled 2014-06-15: qty 1

## 2014-06-15 MED ORDER — POTASSIUM CHLORIDE CRYS ER 20 MEQ PO TBCR
40.0000 meq | EXTENDED_RELEASE_TABLET | Freq: Once | ORAL | Status: AC
Start: 2014-06-15 — End: 2014-06-15
  Administered 2014-06-15: 40 meq via ORAL
  Filled 2014-06-15: qty 2

## 2014-06-15 NOTE — Consult Note (Signed)
Patient Christopher Jimenez      DOB: 1951/06/22      JHE:[id number]     Consult Note from the Palliative Medicine Team at [facility] Requested by: Dr. Marin Olp     PCP:  Melinda Crutch, MD Reason for Consultation: Hospice options    Phone [phone]  Assessment of patients Current state: I met today with Christopher Jimenez, wife [place], son Max, and son Marjory Lies over telephone. Christopher Jimenez tells me how he was not tolerating chemotherapy well at all recently and had extreme fatigue, weakness, no appetite, nausea, and was beginning to pass out. He was brought to the hospital after passing out 5-10 minutes per son. Christopher Jimenez has decided that he does not want to continue any chemotherapy or aggressive treatment and that he wants comfort and to optimize his quality of life for the time he has left. He has opted for hospice to help him during this time. He wants to go home with hospice but he is in the process of finding a new housing arrangement as he says they have an apartment in [place] but there are many people in the apartment and it is not a restful/peaceful environment that he currently needs. I recommend short term rehab to help optimize his independence for preparation for home with hospice. All agree this would be a good plan for them and Andrick likes this plan with palliative to follow at rehab. Following rehab would be home with hospice with option to transition to hospice facility. MOST was complete: DNR, comfort measures (he does not want rehospitalization), determine benefit/limitation of antibiotics (he does not want to come back to hospital for IV antibiotics), IV fluids for defined trial period (he may consider hospitalization for fluids), no feeding tube. Wife Arrie Aran is concerned about any symptoms such as pain, shortness of breath and we discussed how hospice can help manage these symptoms at home and transition to hospice facility. Prognosis very poor and likely only months, definitely < 6 months  likely closer to 2-3 months.    Goals of Care: 1.  Code Status: DNR   2. Scope of Treatment: Continue antibiotics and IV fluids as indicated. DNR confirmed. MOST complete. Comfort is priority.    4. Disposition: SNF rehab with palliative hopefully followed by home with hospice.    3. Symptom Management:   1. Hiccups: Thorazine 25 mg po TID prn.  2. Pain: Oxycodone 5 mg po every 6 hours prn moderate pain. Dilaudid 0.5 IV every 4 hours prn severe pain.  3. Appetite: Continue marinol 5 mg before lunch and supper. Feeding supplements BID.  4. Fever: Acetaminophen prn.  5. Nausea/Vomiting: Ondansetron prn.   4. Psychosocial: Emotional support provided to patient and family at bedside.    Patient Documents Completed or Given: Document Given Completed  Advanced Directives Pkt    MOST  yes  DNR  yes  Gone from My Sight    Hard Choices  yes    Brief HPI: 63 yo male with stage IV colorectal cancer with liver mets (and now suspicious lung lesion) admitted with severe SNR sepsis and neutropenic fever. He has severe protein-calorie malnutrition with weight loss 195 lbs 01/28/14 to 172 lbs 06/13/14 and albumin 1.8. Marinol initiated inpatient and seems to increase appetite and intake tolerance. Mr. Ponds does not wish for further aggressive treatment for his cancer and wishes to focus on comfort, symptom management to increase quality of life with the help of hospice.    ROS: + hiccups (  intermittent), + chest pain (not current)    PMH:  Past Medical History  Diagnosis Date  . Liver metastasis 05/07/2011    solitary 1.5 cm right liver metastasis  . Colon cancer   . Malignant neoplasm of rectum      PSH: Past Surgical History  Procedure Laterality Date  . Colon surgery  January 2011    low anterior resection  . Adrenalectomy  01/10/2011    Pathology revealed metastatic adrenocarcinoma  . Vasectomy reversal  1980   I have reviewed the FH and SH and  If appropriate update it with  new information. Allergies  Allergen Reactions  . Oysters [Shellfish Allergy] Other (See Comments)    Passed out   Scheduled Meds: . dronabinol  5 mg Oral BID AC  . enoxaparin (LOVENOX) injection  40 mg Subcutaneous Q24H  . feeding supplement (ENSURE COMPLETE)  237 mL Oral Once  . imipenem-cilastatin  500 mg Intravenous 4 times per day  . lactose free nutrition  237 mL Oral Once  . sodium chloride  3 mL Intravenous Q12H   Continuous Infusions: . 0.9 % NaCl with KCl 40 mEq / L 100 mL/hr (06/15/14 0530)   PRN Meds:.acetaminophen, acetaminophen, alum & mag hydroxide-simeth, HYDROmorphone (DILAUDID) injection, ondansetron (ZOFRAN) IV, ondansetron, oxyCODONE    BP 105/74  Pulse 77  Temp(Src) 97.9 F (36.6 C) (Oral)  Resp 20  Ht 6' (1.829 m)  Wt 78.1 kg (172 lb 2.9 oz)  BMI 23.35 kg/m2  SpO2 97%   PPS: 40%   Intake/Output Summary (Last 24 hours) at 06/15/14 1017 Last data filed at 06/15/14 0945  Gross per 24 hour  Intake 2153.33 ml  Output   2025 ml  Net 128.33 ml   LBM: 06/15/14                        Physical Exam:  General: NAD, awake, alert, pleasant affect HEENT: [place]/AT, no JVD, moist mucous membranes Chest: No labored breathing, symmetric CVS: RRR, S1 S2 Abdomen: Soft, NT, ND, +BS, colostomy Ext: MAE, no edema, no cyanosis Neuro: Awake, alert, oriented x 3, follows commands  Labs: CBC    Component Value Date/Time   WBC 17.5* 06/15/2014 0353   WBC 8.3 06/02/2014 0839   RBC 3.40* 06/15/2014 0353   RBC 4.43 06/02/2014 0839   HGB 9.6* 06/15/2014 0353   HGB 12.9* 06/02/2014 0839   HCT 27.1* 06/15/2014 0353   HCT 37.9* 06/02/2014 0839   PLT 161 06/15/2014 0353   PLT 224 06/02/2014 0839   MCV 79.7 06/15/2014 0353   MCV 85.6 06/02/2014 0839   MCH 28.2 06/15/2014 0353   MCH 29.1 06/02/2014 0839   MCHC 35.4 06/15/2014 0353   MCHC 34.0 06/02/2014 0839   RDW 13.2 06/15/2014 0353   RDW 13.3 06/02/2014 0839   LYMPHSABS 2.5 06/15/2014 0353   LYMPHSABS 0.7* 06/02/2014 0839   MONOABS 1.1*  06/15/2014 0353   MONOABS 1.5* 06/02/2014 0839   EOSABS 0.7 06/15/2014 0353   EOSABS 0.2 06/02/2014 0839   BASOSABS 0.0 06/15/2014 0353   BASOSABS 0.0 06/02/2014 0839    BMET    Component Value Date/Time   NA 137 06/15/2014 0353   NA 138 06/02/2014 0839   NA 142 02/19/2012 0910   K 3.5* 06/15/2014 0353   K 3.8 06/02/2014 0839   K 5.0* 02/19/2012 0910   CL 104 06/15/2014 0353   CL 105 02/24/2013 1317   CL 99 02/19/2012 0910  CO2 23 06/15/2014 0353   CO2 24 06/02/2014 0839   CO2 30 02/19/2012 0910   GLUCOSE 96 06/15/2014 0353   GLUCOSE 119 06/02/2014 0839   GLUCOSE 101* 02/24/2013 1317   GLUCOSE 103 02/19/2012 0910   BUN 10 06/15/2014 0353   BUN 13.5 06/02/2014 0839   BUN 14 02/19/2012 0910   CREATININE 0.65 06/15/2014 0353   CREATININE 0.9 06/02/2014 0839   CREATININE 1.0 02/19/2012 0910   CALCIUM 7.9* 06/15/2014 0353   CALCIUM 9.7 06/02/2014 0839   CALCIUM 8.5 02/19/2012 0910   GFRNONAA >90 06/15/2014 0353   GFRAA >90 06/15/2014 0353    CMP     Component Value Date/Time   NA 137 06/15/2014 0353   NA 138 06/02/2014 0839   NA 142 02/19/2012 0910   K 3.5* 06/15/2014 0353   K 3.8 06/02/2014 0839   K 5.0* 02/19/2012 0910   CL 104 06/15/2014 0353   CL 105 02/24/2013 1317   CL 99 02/19/2012 0910   CO2 23 06/15/2014 0353   CO2 24 06/02/2014 0839   CO2 30 02/19/2012 0910   GLUCOSE 96 06/15/2014 0353   GLUCOSE 119 06/02/2014 0839   GLUCOSE 101* 02/24/2013 1317   GLUCOSE 103 02/19/2012 0910   BUN 10 06/15/2014 0353   BUN 13.5 06/02/2014 0839   BUN 14 02/19/2012 0910   CREATININE 0.65 06/15/2014 0353   CREATININE 0.9 06/02/2014 0839   CREATININE 1.0 02/19/2012 0910   CALCIUM 7.9* 06/15/2014 0353   CALCIUM 9.7 06/02/2014 0839   CALCIUM 8.5 02/19/2012 0910   PROT 5.1* 06/15/2014 0353   PROT 7.7 06/02/2014 0839   PROT 6.9 02/19/2012 0910   ALBUMIN 1.8* 06/15/2014 0353   ALBUMIN 3.5 06/02/2014 0839   AST 176* 06/15/2014 0353   AST 13 06/02/2014 0839   AST 21 02/19/2012 0910   ALT 198* 06/15/2014 0353   ALT 13 06/02/2014 0839   ALT 24 02/19/2012  0910   ALKPHOS 99 06/15/2014 0353   ALKPHOS 117 06/02/2014 0839   ALKPHOS 53 02/19/2012 0910   BILITOT 0.5 06/15/2014 0353   BILITOT 1.21* 06/02/2014 0839   BILITOT 0.80 02/19/2012 0910   GFRNONAA >90 06/15/2014 0353   GFRAA >90 06/15/2014 0353     Time In Time Out Total Time Spent with Patient Total Overall Time  1040 1150 45mn 76m    Greater than 50%  of this time was spent counseling and coordinating care related to the above assessment and plan.  AlVinie SillNP Palliative Medicine Team Pager # [phone]-F 8a-5p) Team Phone # [phone]/Weekends)

## 2014-06-15 NOTE — Progress Notes (Signed)
CSW & RNCM, Juliann Pulse spoke with patient re: discharge planning. Patient ambulated with PT 400' with rolling walker. Patient states he now plans to discharge home (on [place]) with hospice services. Patient's wife to transport home at discharge.   No further CSW needs identified - CSW signing off.   Raynaldo Opitz, [facility] Clinical Social Worker cell #: [phone]

## 2014-06-15 NOTE — Progress Notes (Signed)
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 Is on a regular diet now. He still has a lot of liquid stool in the colostomy bag.  He is going to meet with palliative care today.  He is not complaining of any pain.  I still don't see any culture results from the local hospital. These show that he had gram-negative rods in his blood. It would be really nice to know what the identity of the bacteria are. I would have to think that it probably is Escherichia coli. He is on Primaxin.  His white cell count has come back quite nicely. His white cell count today was 17.5. Hemoglobin 9.6. Platelet count 161.  His liver function tests are going up. Much rest why they are going up. His bilirubin is doing okay.  His prealbumin is only 3.4. This, in my mind, is a very poor prognostic factor. I would have to think that we probably are looked at no more than 2 months or so for prognosis.  He really wants to go home. Hopefully, this can be done. He probably needs to be also oral antibiotics. I would think that Levaquin would be appropriate for him. Again, we do not have the identification of the gram-negative rod.  His vital signs look stable. His blood pressure is 105/74. His head exam shows no ocular or oral lesions. He has no adenopathy in the neck. Lungs are clear. Cardiac exam regular rhythm. Abdomen soft. Colostomy is intact. His colostomy was full of liquid. There is no guarding or rebound tenderness. He has no palpable liver or spleen tip. Extremities shows no clubbing, cyanosis or edema.  Again, he wants comfort care and quality of life. He is a DO NOT RESUSCITATE. Hospice will follow him as an outpatient.  I very much appreciate all the outstanding care that he is getting.  Christopher Jimenez

## 2014-06-15 NOTE — Progress Notes (Signed)
PROGRESS NOTE    Christopher Jimenez HBZ:[id number] DOB: 1951-02-19 DOA: 06/13/2014 PCP:  Melinda Crutch, MD Primary Oncologist: Dr. Zola Button.  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 Evaluation day or revealed neutropenia. Blood cultures were drawn, patient was placed on IV Primaxin, case discussed with oncologist on call and patient was transferred to [facility] long ED. Outside hospital reported 3/5 blood cultures positive for GNR. At [facility] long ED, WBC 1.1 and absolute neutrophil count of 0.4. On the floor, he was noted to have new onset A. fib with RVR.  Assessment/Plan:  1. E Coli sepsis/bacteremia, present on admission: Source unclear. Urine culture negative. C. difficile PCR negative. Final blood culture results from outside hospital confirmed Escherichia coli sensitive to Cipro, Primaxin and resistant to Bactrim. Do not suspect line sepsis or mucositis and hence hold off on vancomycin. Definitely improved compared to 9/20. DC Primaxin and start oral Cipro to complete total of 14 days treatment 2. Pancytopenia/febrile neutropenia: Secondary to recent chemotherapy. Treat sepsis as above. Supportive treatment. Transfuse if hemoglobin less than 7 g per DL. Treated with Granix and WBC improved to 4.7-DC Granix-WBC increased to 17 likely residual effect of this. No reported bleeding. 3. Severe dehydration with mild hyponatremia: Secondary to GI losses, poor oral intake and severe sepsis. Aggressively hydrated with IV normal saline. Dehydration resolved. 4. New A. fib with RVR: Likely precipitated by acute illness and sepsis. 2-D echo showed LVEF 65%. TSH low normal. Resolved after  aggressive IV fluid hydration and treatment for sepsis. CDADS 2 score: 0. Reverted to and has remained in sinus rhythm since 9/20 p.m. Given overall poor prognosis and short life expectancy from cancer diagnosis-will not start antiplatelets. 5. Stage IV colorectal cancer , s/p adrenal mass resection, s/p liver metastasis ablation and pulmonary metastasis: Management per oncology. Chest x-ray shows increased right superior hilum and right upper lobe density suspicious for primary lung cancer with postobstructive changes-defer further evaluation to oncology. Patient and family interested in not pursuing aggressive treatment for cancer and wish to proceed with hospice. This was discussed by them with oncology who is in agreement. Patient however wishes to continue antibiotic treatment. Palliative care input appreciated. Patient initially planned to go to short-term rehabilitation prior to going home with hospice. However he has ambulated 400 feet with rolling walker with PT and currently plans to go home with hospice. 6. Hypokalemia: Replete and follow 7. Abnormal LFTs:? Liver metastasis   Code Status: DO NOT RESUSCITATE Family Communication: Discussed with patient's son at bedside Disposition Plan: Home with hospice on 9/23   Consultants:  Medical oncology  Palliative care team  Procedures:  None  Antibiotics:  IV Primaxin 9/20 > 9/22  PO Cipro 9/22 >  Subjective: Denies complaints. States that he slept well last night. Hiccups improved.  Objective: Filed Vitals:   06/14/14 1408 06/14/14 1954 06/15/14 0004 06/15/14 0606  BP: 107/70 105/66 94/66 105/74  Pulse: 89 85 85 77  Temp: 98.2 F (36.8 C) 97.9 F (36.6 C) 98.4 F (36.9 C) 97.9 F (36.6 C)  TempSrc: Oral Oral Oral Oral  Resp: 22 20 20 20   Height:      Weight:      SpO2: 97% 90% 96% 97%    Intake/Output  Summary (Last 24 hours) at 06/15/14 1610 Last data filed at 06/15/14 1440  Gross per 24 hour  Intake 1913.33 ml    Output   2475 ml  Net -561.67 ml   Filed Weights   06/13/14 0650  Weight: 78.1 kg (172 lb 2.9 oz)     Exam:  General exam: Middle-aged moderately built and nourished male lying comfortably in bed. Respiratory system: Diminished breath sounds in the bases with occasional basal crackles otherwise the rest of lung fields clear to auscultation. No increased work of breathing. Cardiovascular system: S1 & S2 heard, RRR. No JVD, murmurs, gallops, clicks or pedal edema. Telemetry: Sinus rhythm Gastrointestinal system: Abdomen is nondistended, soft and nontender. Normal bowel sounds heard. Colostomy draining liquid green stools-decreased amounts. Central nervous system: Alert and oriented x3. No focal neurological deficits. Extremities: Symmetric 5 x 5 power.   Data Reviewed: Basic Metabolic Panel:  Recent Labs Lab 06/13/14 0245 06/14/14 0417 06/15/14 0353  NA 132* 135* 137  K 4.4 3.1* 3.5*  CL 96 102 104  CO2 21 22 23   GLUCOSE 130* 117* 96  BUN 18 11 10   CREATININE 0.81 0.64 0.65  CALCIUM 8.9 8.1* 7.9*   Liver Function Tests:  Recent Labs Lab 06/14/14 0417 06/15/14 0353  AST 134* 176*  ALT 104* 198*  ALKPHOS 75 99  BILITOT 0.6 0.5  PROT 5.3* 5.1*  ALBUMIN 1.8* 1.8*   No results found for this basename: LIPASE, AMYLASE,  in the last 168 hours No results found for this basename: AMMONIA,  in the last 168 hours CBC:  Recent Labs Lab 06/13/14 0245 06/14/14 0417 06/15/14 0353  WBC 1.1* 4.7 17.5*  NEUTROABS 0.4* 3.8 13.2*  HGB 11.6* 9.2* 9.6*  HCT 32.1* 26.0* 27.1*  MCV 77.7* 79.8 79.7  PLT 110* 117* 161   Cardiac Enzymes: No results found for this basename: CKTOTAL, CKMB, CKMBINDEX, TROPONINI,  in the last 168 hours BNP (last 3 results) No results found for this basename: PROBNP,  in the last 8760 hours CBG: No results found for this basename: GLUCAP,  in the last 168 hours  Recent Results (from the past 240 hour(s))  CLOSTRIDIUM DIFFICILE BY PCR      Status: None   Collection Time    06/13/14  9:00 AM      Result Value Ref Range Status   C difficile by pcr NEGATIVE  NEGATIVE Final   Comment: Performed at [facility]     Status: None   Collection Time    06/13/14  2:09 PM      Result Value Ref Range Status   Specimen Description URINE, CLEAN CATCH   Final   Special Requests NONE   Final   Culture  Setup Time     Final   Value: 06/13/2014 19:10     Performed at [place]     Final   Value: NO GROWTH     Performed at [facility]   Culture     Final   Value: NO GROWTH     Performed at [facility]   Report Status 06/14/2014 FINAL   Final        Studies: No results found.      Scheduled Meds: . dronabinol  5 mg Oral BID AC  . enoxaparin (LOVENOX) injection  40 mg Subcutaneous Q24H  . feeding supplement (ENSURE COMPLETE)  237 mL Oral BID BM  . imipenem-cilastatin  500 mg Intravenous 4 times per day  . sodium chloride  3 mL Intravenous Q12H   Continuous Infusions:    Principal Problem:   Neutropenic fever Active Problems:   Liver metastasis   Colorectal cancer, stage IV   Sepsis   Pancytopenia   Hyponatremia   Protein-calorie malnutrition, severe   Palliative care encounter    Time spent: 31 minutes    Atwell Mcdanel, MD, FACP, FHM. [facility] Pager [phone]  If 7PM-7AM, please contact night-coverage [email or web address] Password [email or web address] 06/15/2014, 4:10 PM    LOS: 2 days

## 2014-06-15 NOTE — Evaluation (Signed)
Physical Therapy Evaluation Patient Details Name: Christopher Jimenez MRN: [id number] DOB: 1951/05/14 Today's Date: 06/15/2014   History of Present Illness  63 yo male admitted with neutropenic fever, sepsis, A fib. hx of metastatic colorectal cancer.   Clinical Impression  On eval, pt was Min guard assist for mobility-able to ambulate ~400 feet with rolling walker. Tolerated activity very well. May even be able to progress to walking without assistive device if strength continues to improve. Pt states plan is for ST rehab then home.     Follow Up Recommendations SNF;Supervision for mobility/OOB (unless family can assist as needed at home. )    Equipment Recommendations  Rolling walker with 5" wheels (possibly)    Recommendations for Other Services       Precautions / Restrictions Precautions Precautions: Fall Restrictions Weight Bearing Restrictions: No      Mobility  Bed Mobility Overal bed mobility: Modified Independent                Transfers Overall transfer level: Needs assistance Equipment used: Rolling walker (2 wheeled) Transfers: Sit to/from Stand Sit to Stand: Min guard         General transfer comment: VCS safety, hand placement  Ambulation/Gait Ambulation/Gait assistance: Min guard Ambulation Distance (Feet): 400 Feet Assistive device: Rolling walker (2 wheeled) Gait Pattern/deviations: Step-through pattern;Decreased stride length     General Gait Details: slow gait speed but pt tolerated well. close guard/walker use for safety. Pt was also able to take a few steps in room without walker use  Stairs            Wheelchair Mobility    Modified Rankin (Stroke Patients Only)       Balance Overall balance assessment: Needs assistance;History of Falls         Standing balance support: No upper extremity supported;During functional activity Standing balance-Leahy Scale: Fair                               Pertinent  Vitals/Pain Pain Assessment: No/denies pain    Home Living Family/patient expects to be discharged to:: Skilled nursing facility Living Arrangements: Spouse/significant other   Type of Home: House Home Access: Level entry     Home Layout: One level Home Equipment: None      Prior Function Level of Independence: Independent               Hand Dominance        Extremity/Trunk Assessment   Upper Extremity Assessment: Generalized weakness           Lower Extremity Assessment: Generalized weakness      Cervical / Trunk Assessment: Normal  Communication   Communication: No difficulties  Cognition Arousal/Alertness: Awake/alert Behavior During Therapy: WFL for tasks assessed/performed Overall Cognitive Status: Within Functional Limits for tasks assessed                      General Comments      Exercises        Assessment/Plan    PT Assessment Patient needs continued PT services  PT Diagnosis Generalized weakness   PT Problem List Decreased strength;Decreased activity tolerance;Decreased balance;Decreased mobility;Decreased knowledge of use of DME  PT Treatment Interventions DME instruction;Gait training;Functional mobility training;Therapeutic activities;Patient/family education;Balance training;Therapeutic exercise   PT Goals (Current goals can be found in the Care Plan section) Acute Rehab PT Goals Patient Stated Goal: to get stronger. move more PT  Goal Formulation: With patient Time For Goal Achievement: 06/29/14 Potential to Achieve Goals: Fair    Frequency Min 3X/week   Barriers to discharge        Co-evaluation               End of Session   Activity Tolerance: Patient tolerated treatment well Patient left: in chair;with call bell/phone within reach           Time: 1414-1454 PT Time Calculation (min): 40 min   Charges:   PT Evaluation $Initial PT Evaluation Tier I: 1 Procedure PT Treatments $Gait Training:  23-37 mins   PT G Codes:          Weston Anna, MPT Pager: [phone]

## 2014-06-16 ENCOUNTER — Telehealth: Payer: Self-pay | Admitting: Medical Oncology

## 2014-06-16 DIAGNOSIS — E43 Unspecified severe protein-calorie malnutrition: Secondary | ICD-10-CM

## 2014-06-16 MED ORDER — CHLORPROMAZINE HCL 25 MG PO TABS: 25.0000 mg | ORAL_TABLET | Freq: Three times a day (TID) | ORAL | Status: AC | PRN

## 2014-06-16 MED ORDER — ENSURE COMPLETE PO LIQD: 237.0000 mL | Freq: Two times a day (BID) | ORAL | Status: AC

## 2014-06-16 MED ORDER — ACETAMINOPHEN 325 MG PO TABS: 650.0000 mg | ORAL_TABLET | Freq: Four times a day (QID) | ORAL | Status: AC | PRN

## 2014-06-16 MED ORDER — DRONABINOL 2.5 MG PO CAPS: 5.0000 mg | ORAL_CAPSULE | Freq: Two times a day (BID) | ORAL | Status: AC

## 2014-06-16 MED ORDER — CIPROFLOXACIN HCL 500 MG PO TABS: 500.0000 mg | ORAL_TABLET | Freq: Two times a day (BID) | ORAL | Status: AC

## 2014-06-16 NOTE — Discharge Instructions (Signed)
Sepsis °Sepsis is a serious infection of your blood or tissues that affects your whole body. The infection that causes sepsis may be bacterial, viral, fungal, or parasitic. Sepsis may be life threatening. Sepsis can cause your blood pressure to drop. This may result in shock. Shock causes your central nervous system and your organs to stop working correctly.  °RISK FACTORS °Sepsis can happen in anyone, but it is more likely to happen in people who have weakened immune systems. °SIGNS AND SYMPTOMS  °Symptoms of sepsis can include: °· Fever or low body temperature (hypothermia). °· Rapid breathing (hyperventilation). °· Chills. °· Rapid heartbeat (tachycardia). °· Confusion or light-headedness. °· Trouble breathing. °· Urinating much less than usual. °· Cool, clammy skin or red, flushed skin. °· Other problems with the heart, kidneys, or brain. °DIAGNOSIS  °Your health care provider will likely do tests to look for an infection, to see if the infection has spread to your blood, and to see how serious your condition is. Tests can include: °· Blood tests, including cultures of your blood. °· Cultures of other fluids from your body, such as: °¨ Urine. °¨ Pus from wounds. °¨ Mucus coughed up from your lungs. °· Urine tests other than cultures. °· X-ray exams or other imaging tests. °TREATMENT  °Treatment will begin with elimination of the source of infection. If your sepsis is likely caused by a bacterial or fungal infection, you will be given antibiotic or antifungal medicines. °You may also receive: °· Oxygen. °· Fluids through an IV tube. °· Medicines to increase your blood pressure. °· A machine to clean your blood (dialysis) if your kidneys fail. °· A machine to help you breathe if your lungs fail. °SEEK IMMEDIATE MEDICAL CARE IF: °You get an infection or develop any of the signs and symptoms of sepsis after surgery or a hospitalization. °Document Released: 06/09/2003 Document Revised: 09/15/2013 Document Reviewed:  05/18/2013 °ExitCare® Patient Information ©2015 [employer]. This information is not intended to replace advice given to you by your health care provider. Make sure you discuss any questions you have with your health care provider. ° °

## 2014-06-16 NOTE — Telephone Encounter (Signed)
Agreed -

## 2014-06-16 NOTE — Discharge Summary (Signed)
Physician Discharge Summary  Arlington Sigmund EUM:[id number] DOB: 07-16-1951 DOA: 06/13/2014  PCP:  Melinda Crutch, MD  Admit date: 06/13/2014 Discharge date: 06/16/2014  Time spent: Greater than 30 minutes  Recommendations for Outpatient Follow-up:  1. Dr. Zola Button, Oncology. [facility] Hospice MD. 2. [facility]. 3. Rolling walker with 5" wheels.  Discharge Diagnoses:  Principal Problem:   Neutropenic fever Active Problems:   Liver metastasis   Colorectal cancer, stage IV   Sepsis   Pancytopenia   Hyponatremia   Protein-calorie malnutrition, severe   Palliative care encounter   Discharge Condition: Improved & Stable  Diet recommendation: Regular diet.  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 Evaluation day or revealed neutropenia. Blood cultures were drawn, patient was placed on IV Primaxin, case discussed with oncologist on call and patient was transferred to [facility] long ED. Outside hospital reported 3/5 blood cultures positive for GNR. At [facility] long ED, WBC 1.1 and absolute neutrophil count of 0.4. On the floor, he was noted to have new onset A. fib with RVR.  Hospital Course:   1. E Coli sepsis/bacteremia, present on admission: Source unclear-? GI. Urine culture negative. C. Difficile & GI pathogen panel PCR negative. Final blood culture results from outside hospital confirmed Escherichia coli sensitive to Cipro, Primaxin and resistant to Bactrim. Improved. DC'ed Primaxin and started oral Cipro to complete total of 10 days treatment. Discussed with ID who recommends 500 mg  twice a day dose. 2. Pancytopenia/febrile neutropenia: Secondary to recent chemotherapy. Treated sepsis as above. Supportive treatment. Treated with Granix and WBC improved to 4.7-DC Granix-WBC increased to 17 likely residual effect of this. No reported bleeding. Did not get any transfusions of blood products. 3. Severe dehydration with mild hyponatremia: Secondary to GI losses, poor oral intake and severe sepsis. Aggressively hydrated with IV normal saline. Dehydration resolved. 4. New A. fib with RVR: Likely precipitated by acute illness and sepsis. 2-D echo showed LVEF 65%. TSH low normal. Resolved after aggressive IV fluid hydration and treatment for sepsis. CDADS 2 score: 0. Reverted to and has remained in sinus rhythm since 9/20 p.m. Given overall poor prognosis and short life expectancy from cancer diagnosis-will not start antiplatelets. 5. Stage IV colorectal cancer , s/p adrenal mass resection, s/p liver metastasis ablation and pulmonary metastasis: Management per oncology. Chest x-ray shows increased right superior hilum and right upper lobe density suspicious for primary lung cancer with postobstructive changes-could also be pulmonary metastasis. Patient and family interested in not pursuing aggressive treatment for cancer and wish to proceed with hospice. This was discussed by them with oncology who is in agreement. Patient however wishes to continue antibiotic treatment. Palliative care input appreciated. Patient initially planned to go to short-term rehabilitation prior to going home with hospice. However he has ambulated 400 feet with rolling walker with PT and currently plans to go home with hospice.  6. Hypokalemia: Repleted 7. Abnormal LFTs:? Liver metastasis 8. Severe protein calorie malnutrition: Secondary to metastatic cancer. Continue nutritional supplements, regular diet and Marinol.   Consultations:  Medical oncology  Palliative care team  Procedures:  None    Discharge  Exam:  Complaints:  Patient denies complaints. Anxious to go home. No hiccups, nausea, vomiting, abdominal pain or diarrhea. Tolerating diet well. Colostomy output has significantly decreased and improved in consistency/pasty.  Filed Vitals:   06/15/14 0606 06/15/14 1458 06/15/14 2327 06/16/14 0618  BP: 105/74 103/72 123/78 112/74  Pulse: 77 78 73 72  Temp: 97.9 F (36.6 C) 97.8 F (36.6 C) 98.3 F (36.8 C)   TempSrc: Oral Oral Oral   Resp: 20 20 20 20   Height:      Weight:      SpO2: 97% 98% 100% 99%   General exam: Middle-aged moderately built and nourished male lying comfortably in bed.  Respiratory system: Diminished breath sounds in the bases with occasional basal crackles otherwise the rest of lung fields clear to auscultation. No increased work of breathing.  Cardiovascular system: S1 & S2 heard, RRR. No JVD, murmurs, gallops, clicks or pedal edema. Gastrointestinal system: Abdomen is nondistended, soft and nontender. Normal bowel sounds heard. Colostomy draining minimal soft stools.  Central nervous system: Alert and oriented x3. No focal neurological deficits.  Extremities: Symmetric 5 x 5 power.   Discharge Instructions      Discharge Instructions   Call MD for:  persistant nausea and vomiting    Complete by:  As directed      Call MD for:  temperature >100.4    Complete by:  As directed      Diet general    Complete by:  As directed      Increase activity slowly    Complete by:  As directed             Medication List         acetaminophen 325 MG tablet  Commonly known as:  TYLENOL  Take 2 tablets (650 mg total) by mouth every 6 (six) hours as needed for mild pain or fever (or Fever >/= 101).     chlorproMAZINE 25 MG tablet  Commonly known as:  THORAZINE  Take 1 tablet (25 mg total) by mouth 3 (three) times daily as needed for hiccoughs.     ciprofloxacin 500 MG tablet  Commonly known as:  CIPRO  Take 1 tablet (500 mg total) by mouth 2 (two) times  daily.     dronabinol 2.5 MG capsule  Commonly known as:  MARINOL  Take 2 capsules (5 mg total) by mouth 2 (two) times daily before lunch and supper.     feeding supplement (ENSURE COMPLETE) Liqd  Take 237 mLs by mouth 2 (two) times daily between meals.     magic mouthwash w/lidocaine Soln  Take 5 mLs by mouth as needed for mouth pain.     sildenafil 50 MG tablet  Commonly known as:  VIAGRA  Take 1 tablet (50 mg total) by mouth daily as needed for erectile dysfunction.       Follow-up Information   Schedule an appointment as soon as possible for a visit with [facility], MD.   Specialty:  Oncology   Contact information:   [address] [place] [id number] [phone]       Follow up with [facility]. (Will follow up at home.)        The results of significant diagnostics from this hospitalization (including imaging, microbiology, ancillary and laboratory) are listed below for reference.    Significant Diagnostic Studies: Dg Chest Port 1 View  06/13/2014   CLINICAL DATA:  Atrial fibrillation. Neutropenia. Fever. Metastatic colon cancer.  EXAM:  PORTABLE CHEST - 1 VIEW  COMPARISON:  Chest CT dated 08/24/2013.  FINDINGS: Increased size of a right superior hilar mass and patchy and linear density extending into the adjacent right upper lobe. Calcified left hilar lymph nodes are again demonstrated. Normal sized heart. Clear left lung. Left subclavian port catheter tip in the proximal superior vena cava. Unremarkable bones.  IMPRESSION: Increased right superior hilar and right upper lobe density, suspicious for a primary lung carcinoma with postobstructive changes. This could be best evaluated with a chest CT with contrast.   Electronically Signed   By: Enrique Sack M.D.   On: 06/13/2014 09:34    Microbiology: Recent Results (from the past 240 hour(s))  CLOSTRIDIUM DIFFICILE BY PCR     Status: None   Collection Time    06/13/14  9:00 AM      Result Value Ref Range  Status   C difficile by pcr NEGATIVE  NEGATIVE Final   Comment: Performed at [facility]     Status: None   Collection Time    06/13/14  2:09 PM      Result Value Ref Range Status   Specimen Description URINE, CLEAN CATCH   Final   Special Requests NONE   Final   Culture  Setup Time     Final   Value: 06/13/2014 19:10     Performed at [facility]     Final   Value: NO GROWTH     Performed at [facility]   Culture     Final   Value: NO GROWTH     Performed at [facility]   Report Status 06/14/2014 FINAL   Final     Labs: Basic Metabolic Panel:  Recent Labs Lab 06/13/14 0245 06/14/14 0417 06/15/14 0353  NA 132* 135* 137  K 4.4 3.1* 3.5*  CL 96 102 104  CO2 21 22 23   GLUCOSE 130* 117* 96  BUN 18 11 10   CREATININE 0.81 0.64 0.65  CALCIUM 8.9 8.1* 7.9*   Liver Function Tests:  Recent Labs Lab 06/14/14 0417 06/15/14 0353  AST 134* 176*  ALT 104* 198*  ALKPHOS 75 99  BILITOT 0.6 0.5  PROT 5.3* 5.1*  ALBUMIN 1.8* 1.8*   No results found for this basename: LIPASE, AMYLASE,  in the last 168 hours No results found for this basename: AMMONIA,  in the last 168 hours CBC:  Recent Labs Lab 06/13/14 0245 06/14/14 0417 06/15/14 0353  WBC 1.1* 4.7 17.5*  NEUTROABS 0.4* 3.8 13.2*  HGB 11.6* 9.2* 9.6*  HCT 32.1* 26.0* 27.1*  MCV 77.7* 79.8 79.7  PLT 110* 117* 161   Cardiac Enzymes: No results found for this basename: CKTOTAL, CKMB, CKMBINDEX, TROPONINI,  in the last 168 hours BNP: BNP (last 3 results) No results found for this basename: PROBNP,  in the last 8760 hours CBG: No results found for this basename: GLUCAP,  in the last 168 hours  Additional labs: 1. Prealbumin: 3.4 2. TSH: 0.412 3. 2 D Echo: Study Conclusions  - Left ventricle: The cavity size was normal. Systolic function was normal. The estimated ejection fraction was in the range of 55% to 65%. Wall motion was normal; there were  no regional wall motion abnormalities.    Signed:  Vernell Leep, MD, FACP, FHM. [facility] Pager [phone]  If 7PM-7AM, please contact night-coverage [email or web address] Password [facility] 06/16/2014, 11:42 AM

## 2014-06-16 NOTE — Telephone Encounter (Signed)
Christopher Jimenez with [facility] calling for Hospice orders for patient, patient has decided on Hospice management.  Per Dr. Hazeline Junker protocol,  V.O. Given for hospice MD to do symptom management and Dr. Alen Blew to be attending.  Christopher Jimenez expressed understanding no further questions at this time.

## 2014-06-16 NOTE — Progress Notes (Signed)
I spoke with Christopher Jimenez and son, Christopher Jimenez, at bedside. They are disappointed rehab will not be an option. Christopher Jimenez says that better accommodations will be ready hopefully this weekend but will go home with hospice to their apartment in the interim. He is very pleasant and cooperative although he was hoping for more peaceful arrangements in the interim. He is preparing for home today.   Vinie Sill, NP Palliative Medicine Team Pager # [phone] (M-F 8a-5p) Team Phone # [phone] (Nights/Weekends)

## 2014-07-09 ENCOUNTER — Other Ambulatory Visit: Payer: Self-pay

## 2014-07-14 ENCOUNTER — Ambulatory Visit: Payer: BC Managed Care – PPO | Admitting: Oncology

## 2014-07-14 ENCOUNTER — Other Ambulatory Visit: Payer: BC Managed Care – PPO

## 2014-07-14 ENCOUNTER — Ambulatory Visit: Payer: BC Managed Care – PPO

## 2014-09-24 DEATH — deceased

## 2014-11-02 ENCOUNTER — Encounter: Payer: Self-pay | Admitting: *Deleted

## 2015-12-01 ENCOUNTER — Encounter: Payer: Self-pay | Admitting: Oncology

## 2015-12-01 NOTE — Progress Notes (Signed)
Equip form left for dr.[email or web address]

## 2016-09-18 ENCOUNTER — Other Ambulatory Visit: Payer: Self-pay | Admitting: Nurse Practitioner

## 2017-01-07 NOTE — Progress Notes (Signed)
No longer being seen as a patient
